# Patient Record
Sex: Female | Born: 1975
Health system: Southern US, Community
[De-identification: ages and names within clinical notes are randomized; demographics above are authoritative.]

## PROBLEM LIST (undated history)

## (undated) DIAGNOSIS — C439 Malignant melanoma of skin, unspecified: Secondary | ICD-10-CM

## (undated) DIAGNOSIS — R7611 Nonspecific reaction to tuberculin skin test without active tuberculosis: Secondary | ICD-10-CM

## (undated) DIAGNOSIS — N920 Excessive and frequent menstruation with regular cycle: Secondary | ICD-10-CM

## (undated) DIAGNOSIS — J852 Abscess of lung without pneumonia: Secondary | ICD-10-CM

## (undated) DIAGNOSIS — R634 Abnormal weight loss: Secondary | ICD-10-CM

## (undated) DIAGNOSIS — G43909 Migraine, unspecified, not intractable, without status migrainosus: Secondary | ICD-10-CM

## (undated) DIAGNOSIS — R55 Syncope and collapse: Secondary | ICD-10-CM

## (undated) DIAGNOSIS — E041 Nontoxic single thyroid nodule: Secondary | ICD-10-CM

## (undated) DIAGNOSIS — E236 Other disorders of pituitary gland: Secondary | ICD-10-CM

## (undated) DIAGNOSIS — J439 Emphysema, unspecified: Secondary | ICD-10-CM

## (undated) DIAGNOSIS — D649 Anemia, unspecified: Secondary | ICD-10-CM

## (undated) DIAGNOSIS — R011 Cardiac murmur, unspecified: Secondary | ICD-10-CM

## (undated) HISTORY — PX: UPPER GI ENDOSCOPY: SHX6162

## (undated) HISTORY — PX: BRONCHOSCOPY: SUR163

## (undated) HISTORY — DX: Syncope and collapse: R55

## (undated) HISTORY — DX: Abnormal weight loss: R63.4

## (undated) HISTORY — DX: Cardiac murmur, unspecified: R01.1

## (undated) HISTORY — DX: Migraine, unspecified, not intractable, without status migrainosus: G43.909

## (undated) HISTORY — DX: Nonspecific reaction to tuberculin skin test without active tuberculosis: R76.11

## (undated) HISTORY — PX: WISDOM TOOTH EXTRACTION: SHX21

## (undated) HISTORY — DX: Abscess of lung without pneumonia: J85.2

## (undated) HISTORY — DX: Malignant melanoma of skin, unspecified: C43.9

## (undated) HISTORY — DX: Excessive and frequent menstruation with regular cycle: N92.0

## (undated) HISTORY — PX: OTHER SURGICAL HISTORY: SHX169

## (undated) HISTORY — PX: MEDIASTINOSCOPY: SUR861

## (undated) HISTORY — PX: COLONOSCOPY: SHX174

## (undated) HISTORY — PX: BREAST SURGERY: SHX581

## (undated) HISTORY — DX: Emphysema, unspecified: J43.9

## (undated) HISTORY — PX: TUBAL LIGATION: SHX77

## (undated) HISTORY — DX: Nontoxic single thyroid nodule: E04.1

## (undated) HISTORY — DX: Other disorders of pituitary gland: E23.6

---

## 1999-08-07 ENCOUNTER — Other Ambulatory Visit: Admission: RE | Admit: 1999-08-07 | Discharge: 1999-08-07 | Payer: Self-pay | Admitting: Obstetrics and Gynecology

## 1999-09-26 ENCOUNTER — Other Ambulatory Visit: Admission: RE | Admit: 1999-09-26 | Discharge: 1999-09-26 | Payer: Self-pay | Admitting: Obstetrics and Gynecology

## 1999-09-26 ENCOUNTER — Encounter (INDEPENDENT_AMBULATORY_CARE_PROVIDER_SITE_OTHER): Payer: Self-pay

## 1999-12-20 ENCOUNTER — Other Ambulatory Visit: Admission: RE | Admit: 1999-12-20 | Discharge: 1999-12-20 | Payer: Self-pay | Admitting: Obstetrics and Gynecology

## 2000-04-03 ENCOUNTER — Other Ambulatory Visit: Admission: RE | Admit: 2000-04-03 | Discharge: 2000-04-03 | Payer: Self-pay | Admitting: Obstetrics and Gynecology

## 2001-03-26 ENCOUNTER — Other Ambulatory Visit: Admission: RE | Admit: 2001-03-26 | Discharge: 2001-03-26 | Payer: Self-pay | Admitting: Obstetrics and Gynecology

## 2007-07-17 DIAGNOSIS — E041 Nontoxic single thyroid nodule: Secondary | ICD-10-CM | POA: Insufficient documentation

## 2007-07-17 HISTORY — DX: Nontoxic single thyroid nodule: E04.1

## 2008-04-27 LAB — HM COLONOSCOPY

## 2008-05-17 ENCOUNTER — Ambulatory Visit: Payer: Self-pay | Admitting: Pulmonary Disease

## 2008-05-17 ENCOUNTER — Inpatient Hospital Stay (HOSPITAL_COMMUNITY): Admission: EM | Admit: 2008-05-17 | Discharge: 2008-05-22 | Payer: Self-pay | Admitting: Emergency Medicine

## 2008-05-19 ENCOUNTER — Ambulatory Visit: Payer: Self-pay | Admitting: Infectious Disease

## 2008-05-21 ENCOUNTER — Ambulatory Visit: Payer: Self-pay | Admitting: Hematology & Oncology

## 2008-05-21 ENCOUNTER — Encounter: Payer: Self-pay | Admitting: Infectious Disease

## 2008-05-21 ENCOUNTER — Encounter: Payer: Self-pay | Admitting: Pulmonary Disease

## 2008-05-24 ENCOUNTER — Encounter: Payer: Self-pay | Admitting: Pulmonary Disease

## 2008-06-03 ENCOUNTER — Ambulatory Visit: Payer: Self-pay | Admitting: Thoracic Surgery

## 2008-06-22 ENCOUNTER — Ambulatory Visit: Payer: Self-pay | Admitting: Thoracic Surgery

## 2008-06-22 ENCOUNTER — Encounter: Admission: RE | Admit: 2008-06-22 | Discharge: 2008-06-22 | Payer: Self-pay | Admitting: Thoracic Surgery

## 2008-06-23 ENCOUNTER — Ambulatory Visit: Payer: Self-pay | Admitting: Pulmonary Disease

## 2008-06-23 DIAGNOSIS — Z716 Tobacco abuse counseling: Secondary | ICD-10-CM

## 2008-06-23 HISTORY — DX: Tobacco abuse counseling: Z71.6

## 2008-06-24 ENCOUNTER — Ambulatory Visit: Payer: Self-pay | Admitting: Infectious Disease

## 2008-06-24 DIAGNOSIS — I781 Nevus, non-neoplastic: Secondary | ICD-10-CM

## 2008-06-24 DIAGNOSIS — R634 Abnormal weight loss: Secondary | ICD-10-CM

## 2008-06-24 DIAGNOSIS — R599 Enlarged lymph nodes, unspecified: Secondary | ICD-10-CM | POA: Insufficient documentation

## 2008-06-24 DIAGNOSIS — R197 Diarrhea, unspecified: Secondary | ICD-10-CM | POA: Insufficient documentation

## 2008-06-24 DIAGNOSIS — Z227 Latent tuberculosis: Secondary | ICD-10-CM

## 2008-06-24 DIAGNOSIS — R911 Solitary pulmonary nodule: Secondary | ICD-10-CM

## 2008-06-24 HISTORY — DX: Latent tuberculosis: Z22.7

## 2008-06-24 HISTORY — DX: Nevus, non-neoplastic: I78.1

## 2008-06-24 HISTORY — DX: Solitary pulmonary nodule: R91.1

## 2008-06-24 HISTORY — DX: Abnormal weight loss: R63.4

## 2008-06-28 ENCOUNTER — Encounter: Payer: Self-pay | Admitting: Thoracic Surgery

## 2008-06-28 ENCOUNTER — Inpatient Hospital Stay (HOSPITAL_COMMUNITY): Admission: RE | Admit: 2008-06-28 | Discharge: 2008-06-28 | Payer: Self-pay | Admitting: Thoracic Surgery

## 2008-06-28 ENCOUNTER — Ambulatory Visit: Payer: Self-pay | Admitting: Thoracic Surgery

## 2008-06-30 ENCOUNTER — Ambulatory Visit: Payer: Self-pay | Admitting: Thoracic Surgery

## 2008-07-16 HISTORY — PX: UPPER GI ENDOSCOPY: SHX6162

## 2008-07-16 HISTORY — PX: LUNG REMOVAL, PARTIAL: SHX233

## 2008-07-16 HISTORY — PX: COLONOSCOPY: SHX174

## 2008-07-21 ENCOUNTER — Encounter: Admission: RE | Admit: 2008-07-21 | Discharge: 2008-07-21 | Payer: Self-pay | Admitting: Thoracic Surgery

## 2008-07-21 ENCOUNTER — Ambulatory Visit: Payer: Self-pay | Admitting: Thoracic Surgery

## 2008-08-31 ENCOUNTER — Encounter: Payer: Self-pay | Admitting: Pulmonary Disease

## 2008-09-22 ENCOUNTER — Ambulatory Visit (HOSPITAL_COMMUNITY): Admission: RE | Admit: 2008-09-22 | Discharge: 2008-09-22 | Payer: Self-pay | Admitting: Infectious Disease

## 2008-09-22 ENCOUNTER — Ambulatory Visit: Payer: Self-pay | Admitting: Infectious Disease

## 2008-09-22 DIAGNOSIS — R0789 Other chest pain: Secondary | ICD-10-CM

## 2008-09-22 DIAGNOSIS — C4359 Malignant melanoma of other part of trunk: Secondary | ICD-10-CM

## 2008-09-22 DIAGNOSIS — R091 Pleurisy: Secondary | ICD-10-CM | POA: Insufficient documentation

## 2008-09-22 HISTORY — DX: Malignant melanoma of other part of trunk: C43.59

## 2008-09-22 HISTORY — DX: Other chest pain: R07.89

## 2008-09-22 LAB — CONVERTED CEMR LAB
BUN: 8 mg/dL (ref 6–23)
Basophils Absolute: 0 10*3/uL (ref 0.0–0.1)
Basophils Relative: 0 % (ref 0–1)
Eosinophils Relative: 1 % (ref 0–5)
Glucose, Bld: 94 mg/dL (ref 70–99)
HCT: 38.5 % (ref 36.0–46.0)
Lymphocytes Relative: 22 % (ref 12–46)
MCHC: 34.8 g/dL (ref 30.0–36.0)
MCV: 90.7 fL (ref 78.0–100.0)
Monocytes Absolute: 0.7 10*3/uL (ref 0.1–1.0)
Platelets: 191 10*3/uL (ref 150–400)
Potassium: 4.1 meq/L (ref 3.5–5.3)
RDW: 13.9 % (ref 11.5–15.5)

## 2008-10-05 ENCOUNTER — Ambulatory Visit: Payer: Self-pay | Admitting: Pulmonary Disease

## 2008-11-09 DIAGNOSIS — J189 Pneumonia, unspecified organism: Secondary | ICD-10-CM | POA: Insufficient documentation

## 2008-11-09 HISTORY — DX: Pneumonia, unspecified organism: J18.9

## 2008-12-08 ENCOUNTER — Telehealth: Payer: Self-pay | Admitting: Infectious Disease

## 2008-12-09 ENCOUNTER — Telehealth: Payer: Self-pay | Admitting: Infectious Disease

## 2009-05-01 IMAGING — CT CT CHEST W/O CM
2 of 3 series · 15 of 36 positions shown, 18 images · non-contrast
Comparison: Chest x-ray of 05/21/2008 and CT chest of 11 [DATE]

CLINICAL DATA: Left upper lobe lung mass, short of breath with
exertion, night sweats

CT CHEST WITHOUT CONTRAST
TECHNIQUE: Multidetector CT imaging of the chest was performed
following the standard protocol without IV contrast.

[Series 3: routine chest · axial · 0.64mm/px · z∈[-305,-35]mm · 12 of 64 slices shown, 15 images]
[im 5/64  mediastinal]
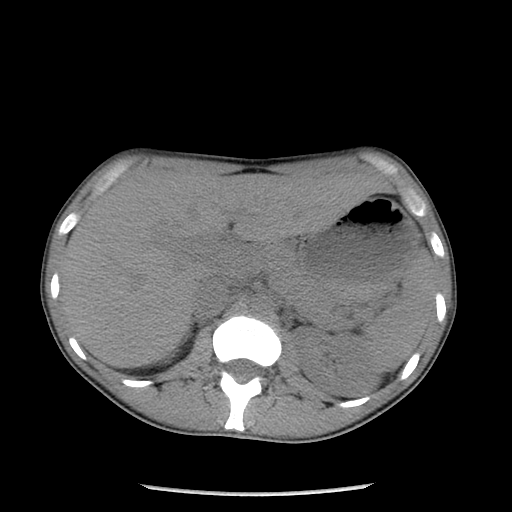
[im 5/64  lung]
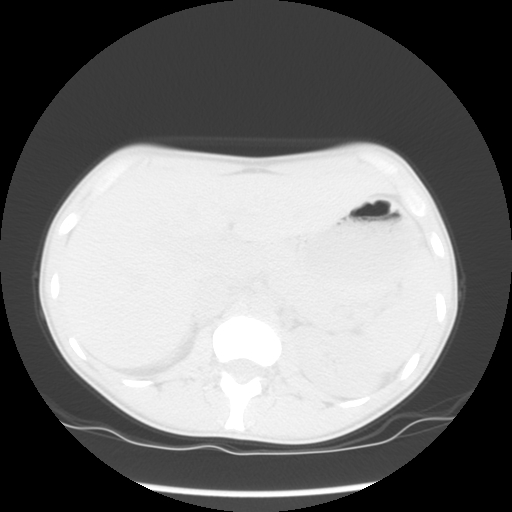
[im 10/64  lung]
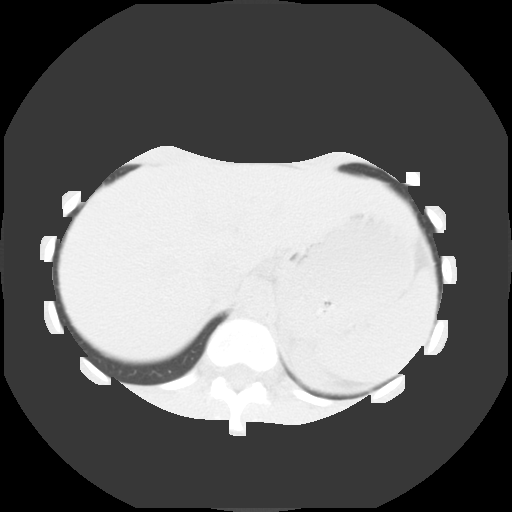
[im 15/64  lung]
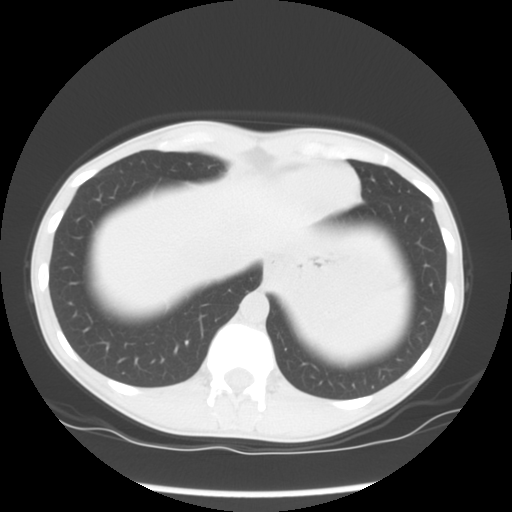
[im 19/64  lung]
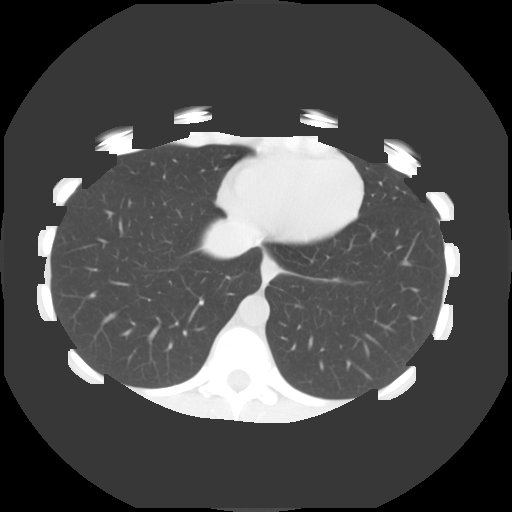
[im 24/64  mediastinal]
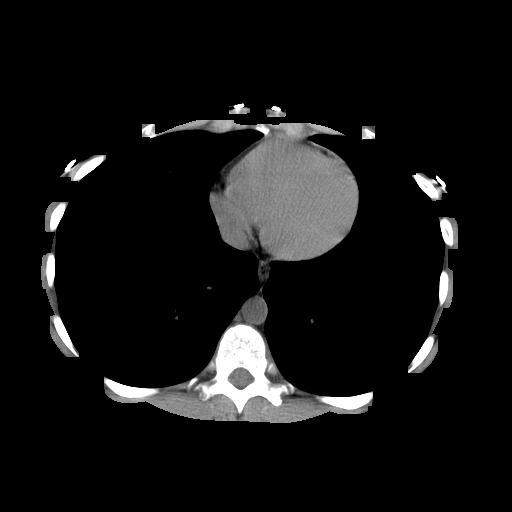
[im 24/64  lung]
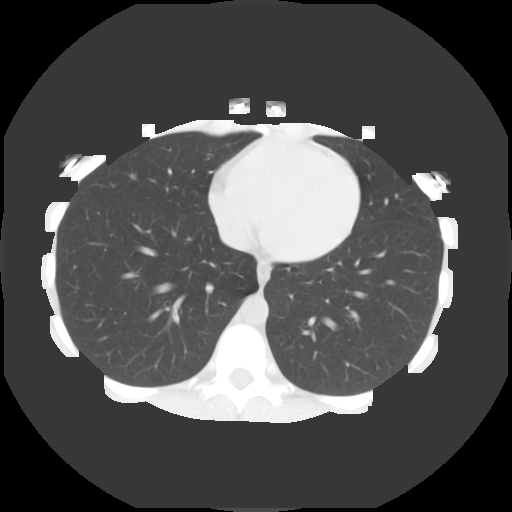
[im 29/64  lung]
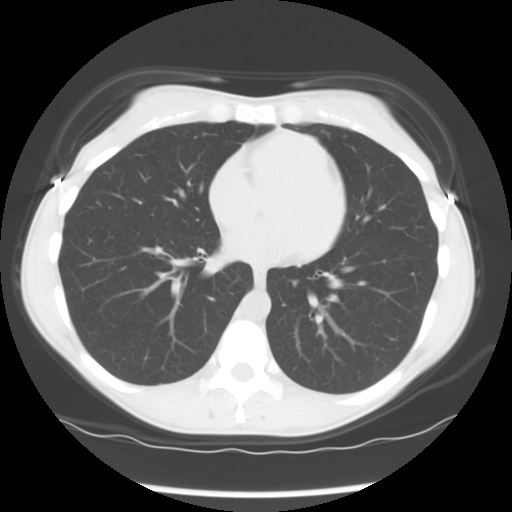
[im 36/64  lung]
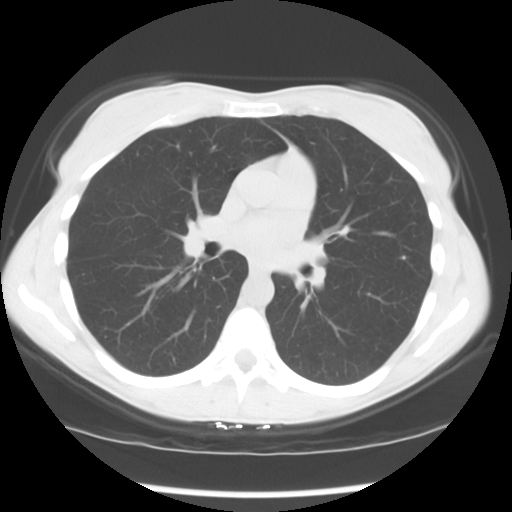
[im 40/64  lung]
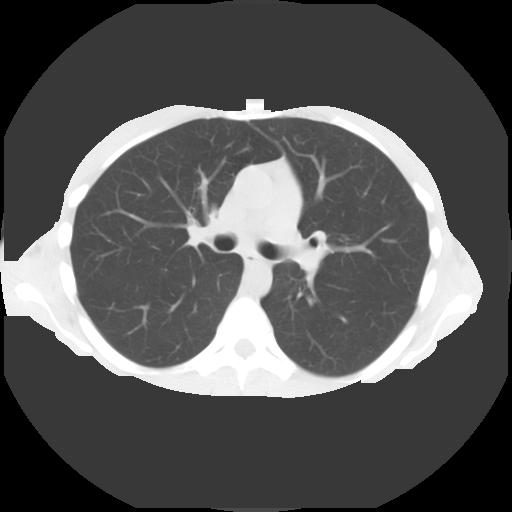
[im 45/64  mediastinal]
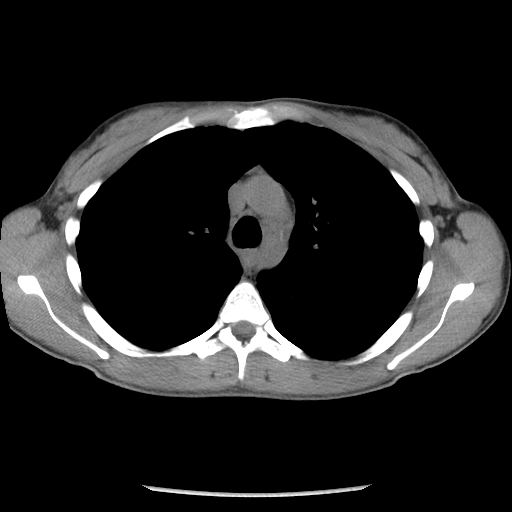
[im 45/64  lung]
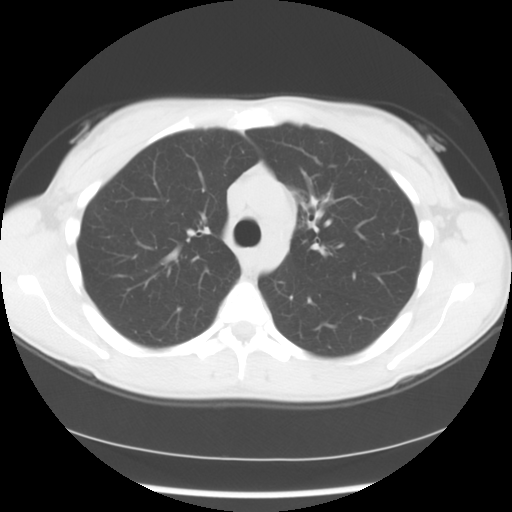
[im 50/64  lung]
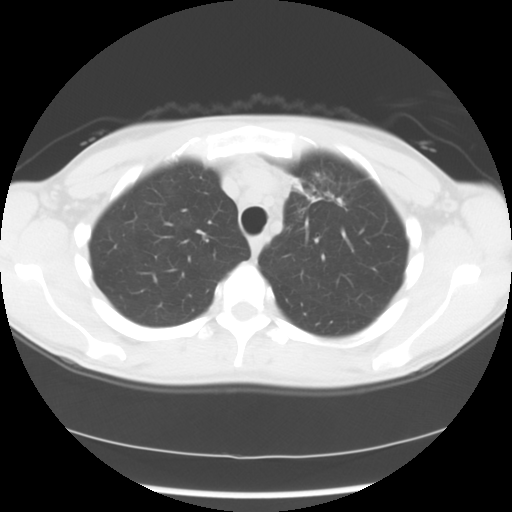
[im 54/64  lung]
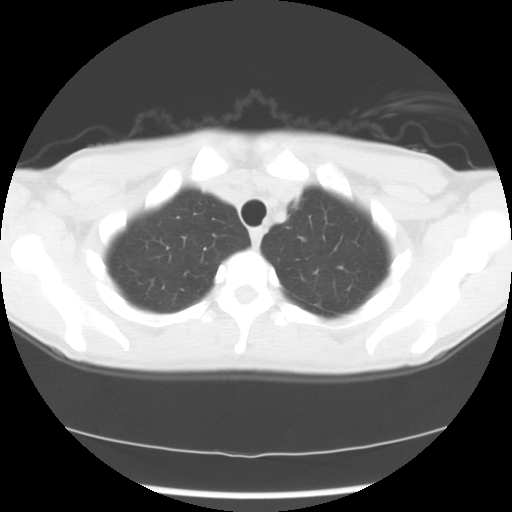
[im 59/64  lung]
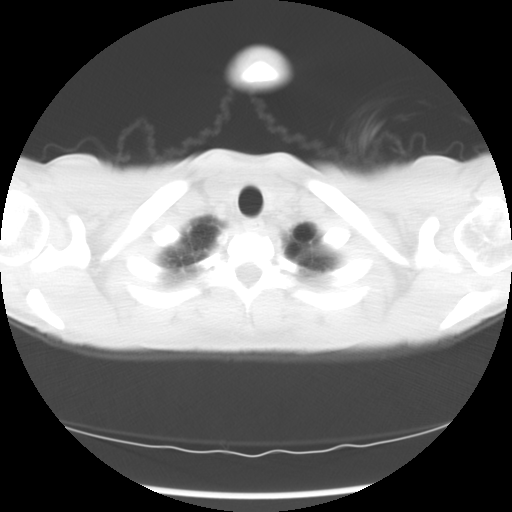

[Series 602: sagittal body · sagittal · 0.64mm/px · 3 of 133 slices shown]
[im 27/133  lung]
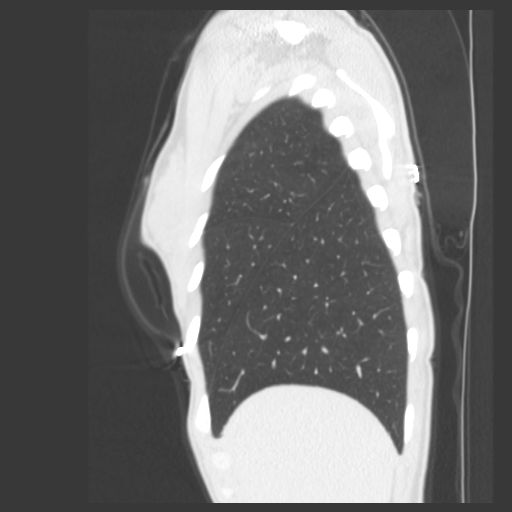
[im 53/133  lung]
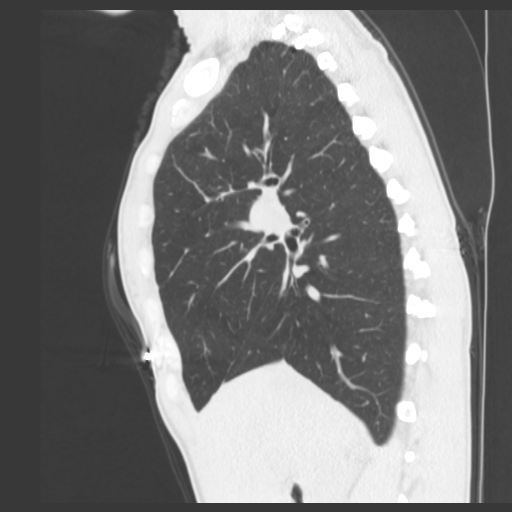
[im 80/133  lung]
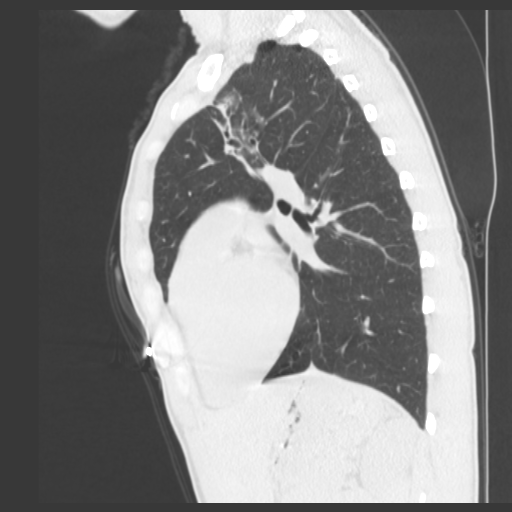

[15 of 36 positions shown; findings below may reference images not displayed]

FINDINGS: There has been definite interval improvement in the
nodular opacity within the anteromedial left upper lobe when
compared to the prior CT.  This suggests an inflammatory origin for
this process.  Slightly prominent bronchi are noted in the left
upper lobe which may indicate some scarring and mild traction
bronchiectasis.  Again tuberculosis would be a primary
consideration in view of the location and clinical history.  The
remainder of the lungs are clear.  No new infiltrate, nodule, or
effusion is noted.  Only a few small mediastinal nodes are present
in the anterior superior mediastinum to the left of midline.  These
appear to have decreased somewhat in the interval in size.  No new
mediastinal or hilar adenopathy is seen.
IMPRESSION: Significant improvement in the nodular opacity within the
anteromedial left upper lobe most consistent with improving
inflammatory process, possibly secondary to tuberculosis.  Decrease
in mediastinal nodes.

## 2009-05-30 IMAGING — CR DG CHEST 2V
2 series · 2 of 2 positions shown · non-contrast
Comparison: Chest x-ray of 06/28/2008 chest x-ray of 06/28/2008 and
CT of the chest of 06/22/2008

CLINICAL DATA: Follow up of left upper lobe lung lesion

CHEST - 2 VIEW

[w chest pa]
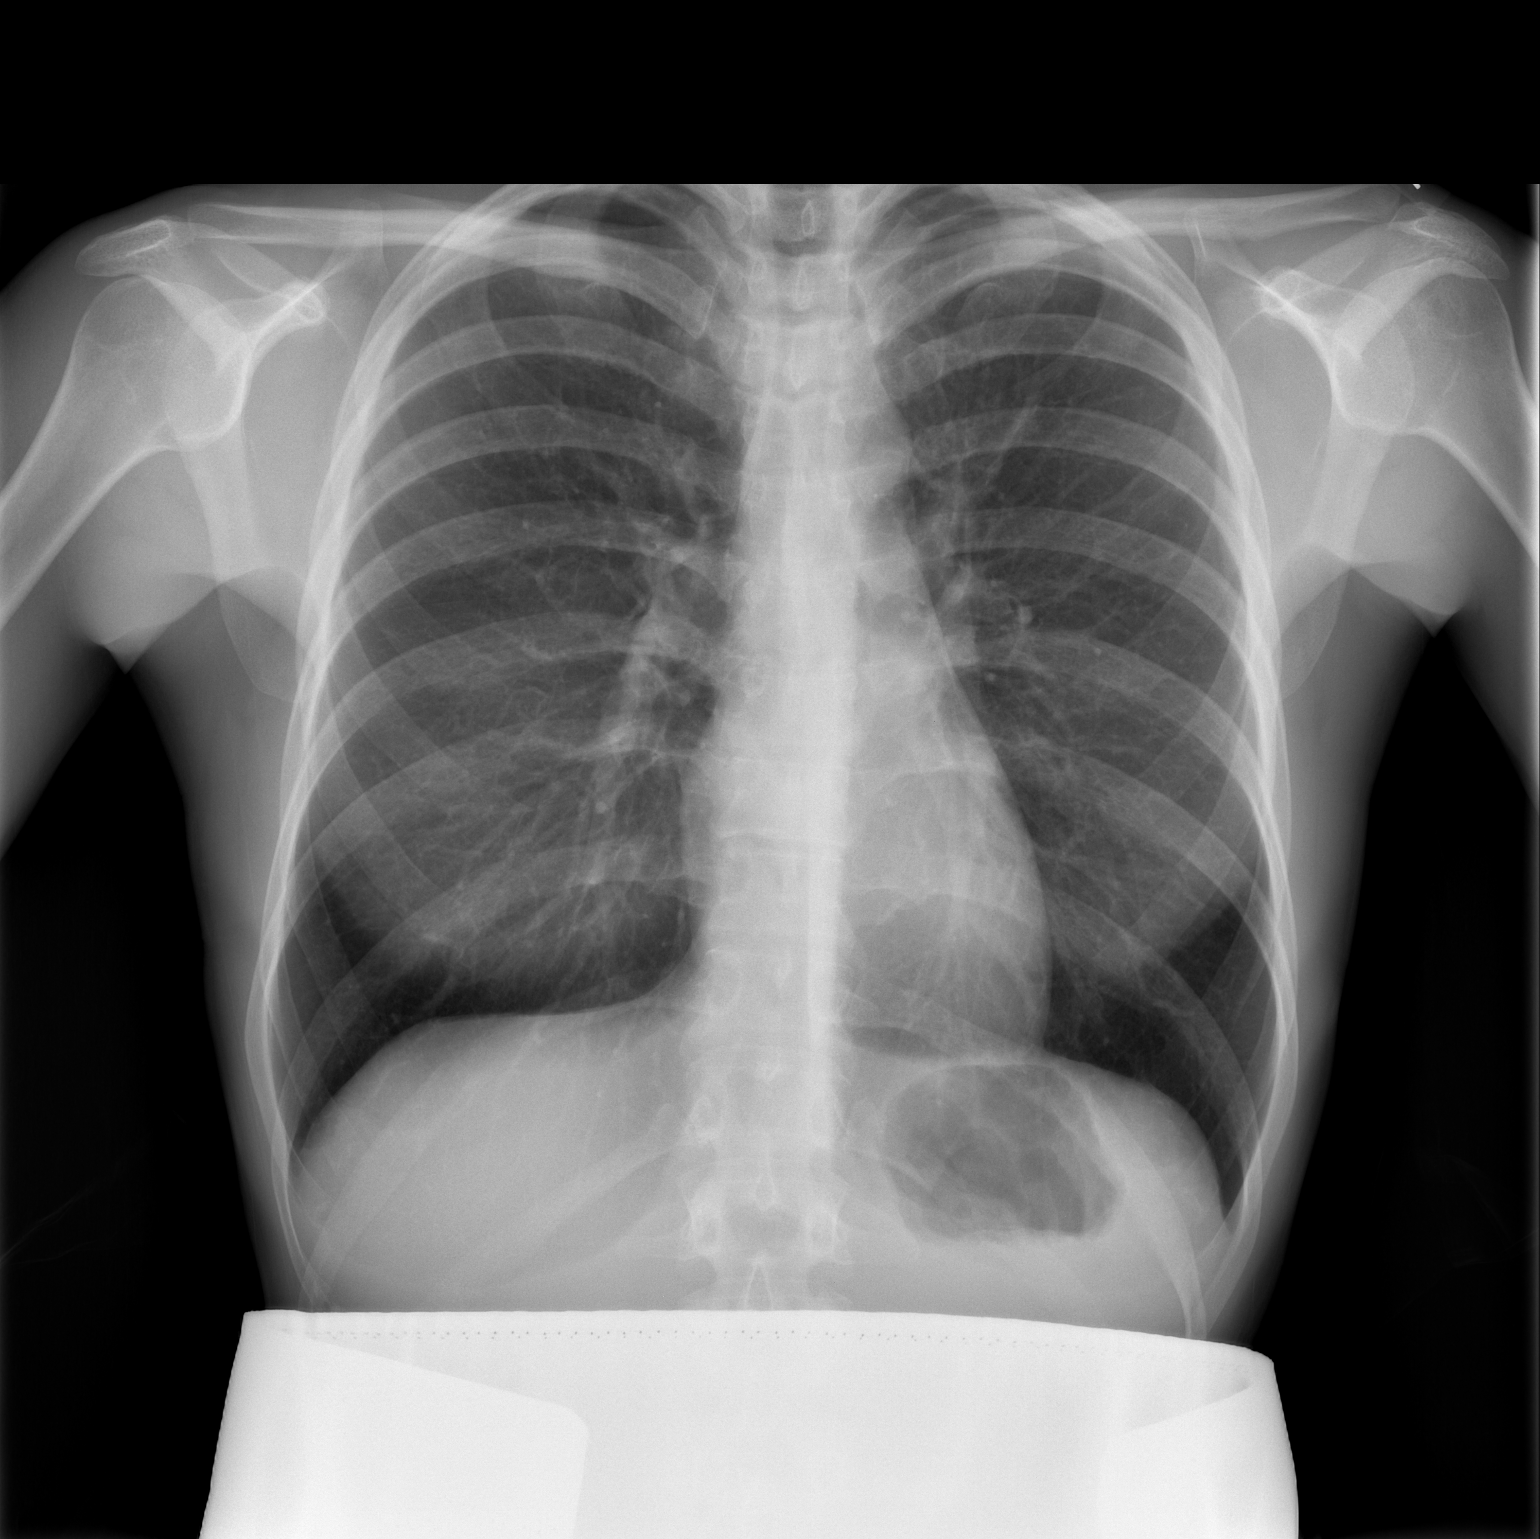

[w chest lat]
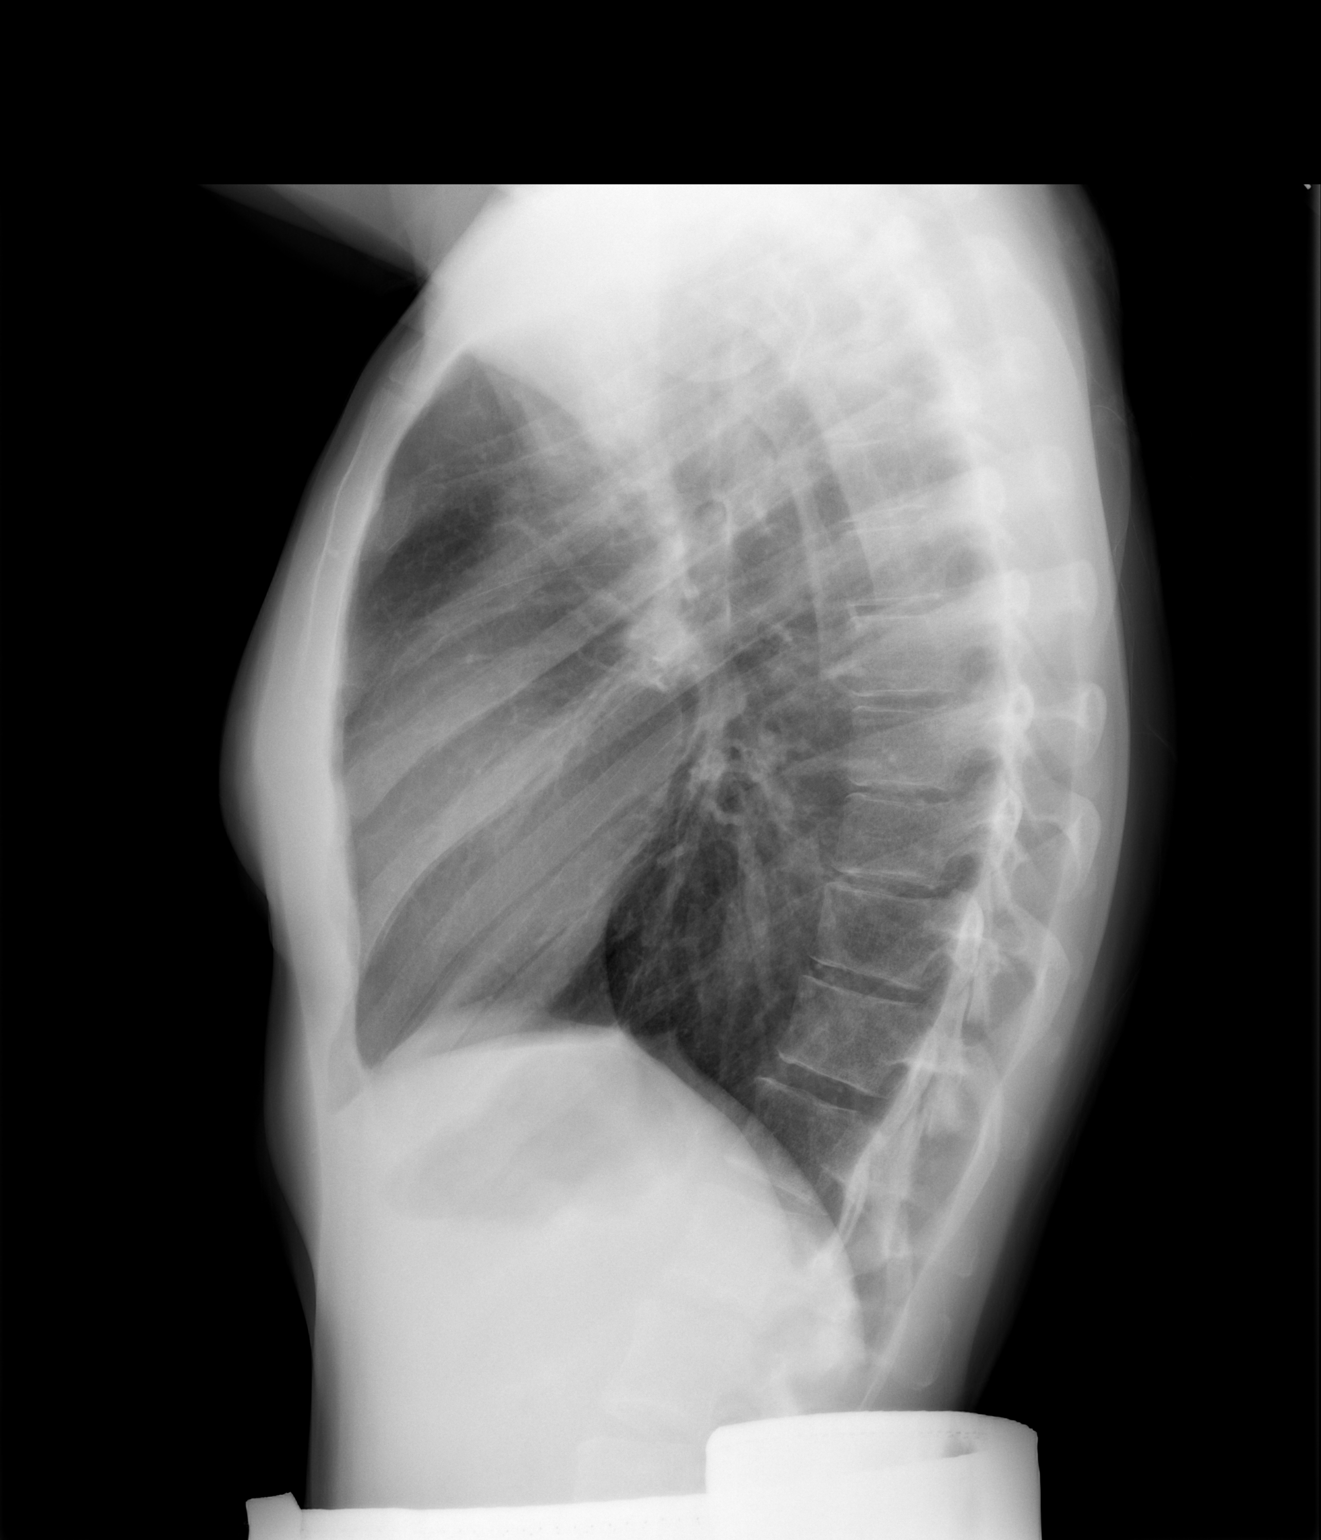

[2 of 2 positions shown; findings below may reference images not displayed]

FINDINGS: The lungs are clear but hyperaerated.  No active
infiltrate or effusion is seen.  No persistent opacity within the
left upper lobe is seen.  The heart is within normal limits in
size.  No bony abnormality is noted.
IMPRESSION: Hyperaeration.  No active lung disease.

## 2010-02-05 ENCOUNTER — Emergency Department (HOSPITAL_COMMUNITY): Admission: EM | Admit: 2010-02-05 | Discharge: 2010-02-05 | Payer: Self-pay | Admitting: *Deleted

## 2010-05-16 ENCOUNTER — Emergency Department (HOSPITAL_COMMUNITY): Admission: EM | Admit: 2010-05-16 | Discharge: 2010-05-16 | Payer: Self-pay | Admitting: Emergency Medicine

## 2010-05-18 ENCOUNTER — Ambulatory Visit (HOSPITAL_COMMUNITY): Admission: RE | Admit: 2010-05-18 | Discharge: 2010-05-18 | Payer: Self-pay | Admitting: Family Medicine

## 2010-05-18 ENCOUNTER — Encounter: Payer: Self-pay | Admitting: Family Medicine

## 2010-05-18 ENCOUNTER — Ambulatory Visit: Payer: Self-pay | Admitting: Family Medicine

## 2010-05-18 DIAGNOSIS — R002 Palpitations: Secondary | ICD-10-CM

## 2010-05-18 DIAGNOSIS — R5381 Other malaise: Secondary | ICD-10-CM | POA: Insufficient documentation

## 2010-05-18 DIAGNOSIS — R5383 Other fatigue: Secondary | ICD-10-CM

## 2010-05-18 HISTORY — DX: Palpitations: R00.2

## 2010-05-18 HISTORY — DX: Other malaise: R53.81

## 2010-05-22 ENCOUNTER — Telehealth: Payer: Self-pay | Admitting: *Deleted

## 2010-05-25 ENCOUNTER — Ambulatory Visit (HOSPITAL_COMMUNITY): Admission: RE | Admit: 2010-05-25 | Discharge: 2010-05-25 | Payer: Self-pay | Admitting: Family Medicine

## 2010-05-25 ENCOUNTER — Telehealth: Payer: Self-pay | Admitting: *Deleted

## 2010-08-13 LAB — CONVERTED CEMR LAB
ALT: 9 units/L (ref 0–35)
AST: 16 units/L (ref 0–37)
Albumin: 4.8 g/dL (ref 3.5–5.2)
Alkaline Phosphatase: 43 units/L (ref 39–117)
BUN: 7 mg/dL (ref 6–23)
CO2: 25 meq/L (ref 19–32)
Calcium: 9.2 mg/dL (ref 8.4–10.5)
Chloride: 104 meq/L (ref 96–112)
Creatinine, Ser: 0.78 mg/dL (ref 0.40–1.20)
Glucose, Bld: 83 mg/dL (ref 70–99)
HCT: 41.5 % (ref 36.0–46.0)
Hemoglobin: 13.9 g/dL (ref 12.0–15.0)
LDH: 129 units/L (ref 94–250)
MCHC: 33.5 g/dL (ref 30.0–36.0)
MCV: 90.4 fL (ref 78.0–100.0)
Platelets: 187 10*3/uL (ref 150–400)
Potassium: 4 meq/L (ref 3.5–5.3)
RBC: 4.59 M/uL (ref 3.87–5.11)
RDW: 13.1 % (ref 11.5–15.5)
Sodium: 139 meq/L (ref 135–145)
TSH: 0.67 microintl units/mL (ref 0.350–4.500)
Total Bilirubin: 0.4 mg/dL (ref 0.3–1.2)
Total Protein: 7.4 g/dL (ref 6.0–8.3)
WBC: 8.1 10*3/uL (ref 4.0–10.5)

## 2010-08-15 NOTE — Progress Notes (Signed)
  Phone Note Call from Patient   Caller: Patient Call For: [phone] Summary of Call: [name] is requesting fax of lab info to number in previous note Initial call taken by: Abundio Miu,  May 25, 2010 10:51 AM  Follow-up for Phone Call        faxed lab and results of CT to pt. Follow-up by: Tessie Fass CMA,  May 26, 2010 11:57 AM

## 2010-08-15 NOTE — Miscellaneous (Signed)
Summary: ROI  ROI   Imported By: De Nurse 05/25/2010 12:12:30  _____________________________________________________________________  External Attachment:    Type:   Image     Comment:   External Document

## 2010-08-15 NOTE — Assessment & Plan Note (Signed)
Summary: NEW PT/PHYSICAL/DGTR [name] EST/TO BRING $20 FOR VISIT/[facility]   Vital Signs:  Patient profile:   35 year old female Height:      66.5 inches Weight:      110 pounds BMI:     17.55 Pulse rate:   81 / minute BP sitting:   135 / 86  (left arm) Cuff size:   regular  Vitals Entered By: Tessie Fass CMA (May 18, 2010 2:24 PM) CC: NEW PT Is Patient Diabetic? No Pain Assessment Patient in pain? no        Primary Care Provider:  Helane Rima DO  CC:  NEW PT.  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 She had GI workup with colonoscopy and biopsies, EGD done by Dr. Alphonzo Lemmings, serologies, all of which were unrevealing. Seen by ID when PPD converted to positive (she had history of [place] friend who had LTB but no other known contacts) and a LUL cavitary lesion with lymhadenopathy.    Her medical history prior to this year largely significant for smoking and migraine headaches.   While at [facility] she was ruled out for TB with serial sputa and bronchoscopy by Dr. Vassie Loll that revealed a white colored endobronchial lesion. Her cultures from BAL, sputa, biopsy failed to grow fungi or TB, path was nondiagnsostic. Her WBC came down while on antibiotics in the hospital day one which were then discontinued. Her ESR was normal at that time. Urine histo ag, serum crypto ags, were negative, ANA and ANCA were negative. Her CT scan showed improvement in her LUL as well as her mediastinal LN. She had mediastinoscopy with LN biopsy and BAL in December which revealed only reactive anthracotic tissue without organisms. AFB, Fungal, nocardia cultures ALL failed to yield an organism. She FINALLY had abnormal skin lesion biopsied by her PCP and this has revlealed stage III melanoma.   Dr Claudie Fisherman , Onc at [facility] - PET neg,  IR needle Bx of lung lesion yielded pus, proceeded with  VATS given persistent fever with chills.  (Hx vague here - patient states that she felt better for several months, had repeat CT that looked good. Not scheduled to f/u with Chin).  She has had reemergence of night sweats, as well as dyspnea on exertion and pleuritic type chest pain. She endorses a 15 pound weight loss again. She stopped oral contraceptives 2 days ago, continues to smoke tobacco.    Habits & Providers  Alcohol-Tobacco-Diet     Tobacco Status: current     Tobacco Counseling: to quit use of tobacco products     Cigarette Packs/Day: 1.0  Current Medications (verified): 1)  None  Allergies (verified): No Known Drug Allergies  Past History:  Past Medical History: Migraine HA Smoker Positive PPD Weight loss Left upper lobe cavity LUL endobronchial lesion with lymphadenopathy    - anthracotic pigment on biopsy seen but no malignancy or organism isolated Stage III Melanoma Hx Thyroid Nodules Hemorroids  Past Surgical History: Bronchoscopy Mediastinoscopy Tubal Ligation  Family History: Positive for CAD, not for lymphoma.  Social History: Smoker. Has two children. Occasional alcohol use. Denies recreational drugs.   Review of Systems General:  Complains of fatigue, loss of appetite, sweats, and weight loss; denies chills and fever. CV:  Complains of chest pain or discomfort and palpitations; denies difficulty breathing while lying down, fainting, shortness of  breath with exertion, and swelling of feet. Resp:  Denies chest pain with inspiration, cough, coughing up blood, shortness of breath, sputum productive, and wheezing. GI:  Denies abdominal pain, constipation, diarrhea, nausea, and vomiting. MS:  Complains of joint pain and muscle aches; denies joint redness and joint swelling. Derm:  Denies lesion(s) and rash. Neuro:  Complains of numbness and tingling; denies falling down, headaches, seizures, sensation of room spinning, and visual  disturbances. Psych:  Denies anxiety and depression. Endo:  Denies cold intolerance and heat intolerance. Heme:  Denies abnormal bruising, bleeding, and enlarge lymph nodes.  Physical Exam  General:  Thin, in no acute distress; alert, appropriate and cooperative throughout examination. Vitals reviewed. Neck:  No deformities, masses, or tenderness noted. Lungs:  Normal respiratory effort, chest expands symmetrically. Lungs are clear to auscultation, no crackles or wheezes. Heart:  Normal rate and regular rhythm. S1 and S2 normal without gallop, murmur, click, rub or other extra sounds. Pulses:  R and L, dorsalis pedis and posterior tibial pulses are full and equal bilaterally. Extremities:  No edema. Skin:  Intact without suspicious lesions or rashes. Cervical Nodes:  No lymphadenopathy noted. Axillary Nodes:  No palpable lymphadenopathy. Inguinal Nodes:  No significant adenopathy. Psych:  Oriented X3, memory intact for recent and remote, and good eye contact. Slightly pressure speech.   Impression & Recommendations:  Problem # 1:  WEIGHT LOSS, ABNORMAL (ICD-783.21) Assessment New CBC, CMP, TSH, LDH, SED. CT chest.  Orders: LDH-[facility] (40347) Sed Rate (ESR)-[facility] (42595)  Problem # 2:  CHEST PAIN (ICD-786.50) Assessment: New EKG NSR, no ST/T changes. Will compare to previous EKG when records obtained. DDX: Cardiac - low likelihood. Patient does have family Hx of CAD and is a smoker, but Hx not c/w. No signs of percarditis on exam. VSS. DDx: Pleuritic - more likely. Hx of cavitary lesion, now with same symptoms as before - will persue work-up. CT pending. Patient not tachycardic. Normal O2 sat. DDx: GI - not c/w history. Patient has previous normal endoscopy and colonoscopy. DDx: MSK - not reproducable.   Will review patient's previous records and update chart.  Problem # 3:  PULMONARY NODULE (ICD-518.89) Assessment: Unchanged Hx of, with similar c/o. Will go ahead and obtain another CT  scan at this time. Note: Patient may not be able to get until she signs up for [facility]. Orders: CT without Contrast (CT w/o contrast)  Problem # 4:  TOBACCO ABUSE (ICD-305.1) Assessment: Unchanged Advised cessation.  Other Orders: Comp Met-[facility] [phone]) CBC-[facility] (95188) TSH-[facility] [phone]) CXR- 2view (CXR)  Patient Instructions: 1)  It was nice to see you today. 2)  We are getting labs, an EKG, and CT today. 3)  I will review your records.   Orders Added: 1)  Comp Met-[facility] [[id number]] 2)  CBC-[facility] [85027] 3)  TSH-[facility] [[id number]] 4)  CXR- 2view [CXR] 5)  LDH-[facility] [83615] 6)  Sed Rate (ESR)-[facility] [85651] 7)  CT without Contrast [CT w/o contrast] 8)  [facility] [99203]    Laboratory Results   Blood Tests   Date/Time Received: May 18, 2010 3:23 PM Date/Time Reported: May 18, 2010 5:15 PM   SED rate: 5  mm/hr  Comments: ...............test performed by......Marland KitchenBonnie A. [place], MLS (ASCP)cm

## 2010-08-15 NOTE — Progress Notes (Signed)
Summary: phn msg  Phone Note Call from Patient Call back at Home Phone [phone]   Caller: Patient Summary of Call: pt is asking about the CT and also the fax # [phone] that is supposed to go on the release form Initial call taken by: De Nurse,  May 22, 2010 12:06 PM  Follow-up for Phone Call        called pt with appt time for CT. Follow-up by: Tessie Fass CMA,  May 22, 2010 4:39 PM

## 2010-08-21 ENCOUNTER — Encounter (INDEPENDENT_AMBULATORY_CARE_PROVIDER_SITE_OTHER): Payer: Self-pay | Admitting: Family Medicine

## 2010-08-21 ENCOUNTER — Encounter: Payer: Self-pay | Admitting: Family Medicine

## 2010-08-21 ENCOUNTER — Other Ambulatory Visit: Payer: Self-pay | Admitting: Family Medicine

## 2010-08-21 DIAGNOSIS — R7989 Other specified abnormal findings of blood chemistry: Secondary | ICD-10-CM

## 2010-08-21 DIAGNOSIS — E349 Endocrine disorder, unspecified: Secondary | ICD-10-CM

## 2010-08-21 DIAGNOSIS — R5383 Other fatigue: Secondary | ICD-10-CM

## 2010-08-21 DIAGNOSIS — N926 Irregular menstruation, unspecified: Secondary | ICD-10-CM

## 2010-08-21 DIAGNOSIS — R5381 Other malaise: Secondary | ICD-10-CM

## 2010-08-21 DIAGNOSIS — R079 Chest pain, unspecified: Secondary | ICD-10-CM

## 2010-08-21 DIAGNOSIS — R002 Palpitations: Secondary | ICD-10-CM

## 2010-08-22 ENCOUNTER — Ambulatory Visit (INDEPENDENT_AMBULATORY_CARE_PROVIDER_SITE_OTHER): Payer: Self-pay

## 2010-08-22 ENCOUNTER — Encounter: Payer: Self-pay | Admitting: Family Medicine

## 2010-08-22 ENCOUNTER — Encounter: Payer: Self-pay | Admitting: *Deleted

## 2010-08-22 LAB — CBC WITH DIFFERENTIAL/PLATELET
Basophils Absolute: 0 10*3/uL (ref 0.0–0.1)
Basophils Relative: 0 % (ref 0–1)
Eosinophils Absolute: 0 10*3/uL (ref 0.0–0.7)
Eosinophils Relative: 0 % (ref 0–5)
HCT: 42.4 % (ref 36.0–46.0)
Hemoglobin: 14.3 g/dL (ref 12.0–15.0)
Lymphocytes Relative: 21 % (ref 12–46)
Lymphs Abs: 2 10*3/uL (ref 0.7–4.0)
MCH: 30.6 pg (ref 26.0–34.0)
MCHC: 33.7 g/dL (ref 30.0–36.0)
MCV: 90.6 fL (ref 78.0–100.0)
Monocytes Absolute: 0.5 10*3/uL (ref 0.1–1.0)
Monocytes Relative: 5 % (ref 3–12)
Neutro Abs: 7 10*3/uL (ref 1.7–7.7)
Neutrophils Relative %: 74 % (ref 43–77)
Platelets: 218 10*3/uL (ref 150–400)
RBC: 4.68 MIL/uL (ref 3.87–5.11)
RDW: 13.9 % (ref 11.5–15.5)
WBC: 9.5 10*3/uL (ref 4.0–10.5)

## 2010-08-22 LAB — COMPREHENSIVE METABOLIC PANEL
ALT: 9 U/L (ref 0–35)
AST: 15 U/L (ref 0–37)
Albumin: 4.3 g/dL (ref 3.5–5.2)
Alkaline Phosphatase: 47 U/L (ref 39–117)
BUN: 10 mg/dL (ref 6–23)
CO2: 27 mEq/L (ref 19–32)
Calcium: 9.1 mg/dL (ref 8.4–10.5)
Chloride: 103 mEq/L (ref 96–112)
Creat: 0.81 mg/dL (ref 0.40–1.20)
Glucose, Bld: 79 mg/dL (ref 70–99)
Potassium: 4.2 mEq/L (ref 3.5–5.3)
Sodium: 138 mEq/L (ref 135–145)
Total Bilirubin: 0.3 mg/dL (ref 0.3–1.2)
Total Protein: 7 g/dL (ref 6.0–8.3)

## 2010-08-22 LAB — LUTEINIZING HORMONE: LH: <0.1 m[IU]/mL

## 2010-08-22 LAB — FOLLICLE STIMULATING HORMONE: FSH: 0.3 m[IU]/mL

## 2010-08-24 ENCOUNTER — Telehealth: Payer: Self-pay | Admitting: Family Medicine

## 2010-08-24 DIAGNOSIS — N926 Irregular menstruation, unspecified: Secondary | ICD-10-CM

## 2010-08-24 HISTORY — DX: Irregular menstruation, unspecified: N92.6

## 2010-08-24 NOTE — Progress Notes (Signed)
  Subjective:    Patient ID: [name], female    DOB: 08-14-1975, 35 y.o.   MRN: [id number]  HPI  Reviewed PCP note, sent info to Red team to call pt to back in for repeat labs  Review of Systems     Objective:   Physical Exam        Assessment & Plan:

## 2010-08-24 NOTE — Progress Notes (Signed)
T.C. I did call the pt already. See phone note from 08-24-10. Pt has lab appt Monday and she will sched. Ov with Dr.Wallace in one week.

## 2010-08-24 NOTE — Progress Notes (Signed)
Addended by: Milinda Antis on: 08/24/2010 03:03 PM   Modules accepted: Orders

## 2010-08-24 NOTE — Telephone Encounter (Signed)
Called pt and informed that we will re-check FSH, LH, TSH, FREE T4, FREE T3 AND BHCG QUANT. Pt has lab appt on Monday. LMP 3 weeks ago. Pt will make appt with Dr.Wallace in one week.

## 2010-08-28 ENCOUNTER — Other Ambulatory Visit: Payer: Self-pay

## 2010-08-28 DIAGNOSIS — R634 Abnormal weight loss: Secondary | ICD-10-CM

## 2010-08-28 DIAGNOSIS — N926 Irregular menstruation, unspecified: Secondary | ICD-10-CM

## 2010-08-28 LAB — HCG, QUANTITATIVE, PREGNANCY: hCG, Beta Chain, Quant, S: <2 m[IU]/mL

## 2010-08-28 LAB — T4: T4, Total: 12.6 ug/dL — ABNORMAL HIGH (ref 5.0–12.5)

## 2010-08-28 LAB — FOLLICLE STIMULATING HORMONE: FSH: 0.5 m[IU]/mL

## 2010-08-28 LAB — LUTEINIZING HORMONE: LH: 0.2 m[IU]/mL

## 2010-08-30 ENCOUNTER — Telehealth: Payer: Self-pay | Admitting: Family Medicine

## 2010-08-30 NOTE — Telephone Encounter (Signed)
To pcp

## 2010-08-31 NOTE — Telephone Encounter (Signed)
Pt called today wanting a copy of labs

## 2010-08-31 NOTE — Miscellaneous (Signed)
Summary: Orders Update  Clinical Lists Changes   Tried to reach patient by phone with no answer x 2 and I will be on vacation for one week starting tomorrow. Patient with normal CBC, CMP. Her FSH and LH were extremely low - in the pregnancy range. Her urine pregnancy test in the office was negative. Unknown LMP but patient has been endorsing mroe frequent menses (q 3 weeks). The etiology of her low FHS, LH is unclear at this point - precepted with Dr. [place].   Plan: Recheck FSH, LH - please ask about LMP; recheck TSH, free T4, free T3; check BHCG quant. Future order completed.  I will discuss this with the physician that will be covering my box for the next week.  The patient may also opt to make an appointment with me when I get back and then get labs.  Helane Rima DO  August 22, 2010 11:21 AM   APPEND:  Patient call clinic  and I was able to speak with her re: above. She will come in next week for labs. Helane Rima DO  August 22, 2010 11:30 AM                   Orders: Added new Test order of LH-[facility] [phone]) - Signed Added new Test order of FSH-[facility] [phone]) - Signed Added new Test order of TSH-[facility] [phone]) - Signed Added new Test order of Free T3-[facility] [phone]) - Signed Added new Test order of Free T4-[facility] [phone]) - Signed Added new Test order of B-HCG Quant-[facility] ([id number]) - Signed  Appended Document: Orders Update pls fax labs to fax [phone]

## 2010-08-31 NOTE — Telephone Encounter (Signed)
I do not know that results of the Holter Monitor yet.  Her repeat labs were very similar to her original labs. I cannot explain them yet, but will discuss the results with a specialist before our visit and we can discuss them at that time.  Please send the patient a copy of her labs.

## 2010-08-31 NOTE — Assessment & Plan Note (Signed)
Summary: REMOVE HOLTER MONITOR/KH  Holter Monitor removed . Patient obtained urine specimen for Upreg as ordered by Dr. Earlene Plater. Theresia Lo RN  August 22, 2010 10:32 AM   Holter Monitor  taken to hospital. Theresia Lo RN  August 22, 2010 11:29 AM  Allergies: No Known Drug Allergies   Complete Medication List: 1)  Alprazolam 1 Mg Tabs (Alprazolam) .... One by mouth up to two times a day as needed for anxiety  Other Orders: No Charge Patient Arrived (NCPA0) (NCPA0)   Orders Added: 1)  No Charge Patient Arrived (NCPA0) [NCPA0]    Laboratory Results   Urine Tests  Date/Time Received: August 22, 2010 10:29 AM  Date/Time Reported: August 22, 2010 10:58 AM     Urine HCG: negative Comments: ...............test performed by......Marland KitchenBonnie A. [place], MLS (ASCP)cm

## 2010-08-31 NOTE — Assessment & Plan Note (Signed)
Summary: night sweats/eo   Vital Signs:  Patient profile:   35 year old female Height:      66.5 inches Weight:      115 pounds BMI:     18.35 Pulse rate:   102 / minute BP sitting:   116 / 78  (left arm) Cuff size:   regular  Vitals Entered By: Tessie Fass CMA (August 21, 2010 9:29 AM) CC: chest pains, light headed Is Patient Diabetic? No Pain Assessment Patient in pain? no        Primary Care Provider:  Helane Rima DO  CC:  chest pains and light headed.  History of Present Illness: 35 yo F, with complicated medical Hx (see last OV note), presenting with:  1. CP - intermittent, non-exertional episodes of CP (midsternum with radiation to back, associated with "air constriction," the feeling of a "hard heartbeat," and a "pass out feeling"). She feels that her heart is racing at those times. This has happened while driving and lying down. Worse over past 2 weeks. Sister and mother with "tachyarrhythmia" per patient. Patient has tried taken Xanax during these episodes to see if it helps (previously Rx by Dr. Lillie Columbia) without relief. Episodes last for several minutes. Denies HA, N/V/D/C, LE edema, rash, LOC. She has issues with weight loss - has been taking Zyprexa "on and off" (samples given by previous doctor) and eating every 3-5 hours to keep her weight up. No known episodes of hypoglycemia. Previous CBC, CMP, TSH, SED, CT scan all WNL. Patient also with night sweats - not new. She endorses changes in menses over the past several months - menses q 2-3 weeks, painful sex 2/2 vaginal dryness last year (not sexually active now). Hx fibroids. + smoker.  Habits & Providers  Alcohol-Tobacco-Diet     Tobacco Status: current     Tobacco Counseling: to quit use of tobacco products     Cigarette Packs/Day: 1.0  Current Medications (verified): 1)  Alprazolam 1 Mg  Tabs (Alprazolam) .... One By Mouth Up To Two Times A Day As Needed For Anxiety  Allergies (verified): No Known Drug  Allergies  Past History:  Past Medical History: Last updated: 05/18/2010 Migraine HA Smoker Positive PPD Weight loss Left upper lobe cavity LUL endobronchial lesion with lymphadenopathy    - anthracotic pigment on biopsy seen but no malignancy or organism isolated Stage III Melanoma Hx Thyroid Nodules Hemorroids  Past Surgical History: Last updated: 05/18/2010 Bronchoscopy Mediastinoscopy Tubal Ligation  Family History: Last updated: 05/18/2010 Positive for CAD, not for lymphoma.  Social History: Last updated: 05/18/2010 Smoker. Has two children. Occasional alcohol use. Denies recreational drugs.   Risk Factors: Alcohol Use: <1 (09/22/2008) Caffeine Use: yes (09/22/2008) Exercise: yes (09/22/2008)  Risk Factors: Smoking Status: current (08/21/2010) Packs/Day: 1.0 (08/21/2010) PMH-FH-SH reviewed for relevance  Review of Systems      See HPI  Physical Exam  General:  Thin, in no acute distress; alert, appropriate and cooperative throughout examination. Vitals reviewed. Neck:  No deformities, masses, or tenderness noted. Lungs:  Normal respiratory effort, chest expands symmetrically. Lungs are clear to auscultation, no crackles or wheezes. Heart:  Normal rate and regular rhythm. S1 and S2 normal without gallop, murmur, click, rub or other extra sounds. Pulses:  R and L, dorsalis pedis and posterior tibial pulses are full and equal bilaterally. Extremities:  No edema. Skin:  Intact without suspicious lesions or rashes. Cervical Nodes:  No lymphadenopathy noted. Axillary Nodes:  No palpable lymphadenopathy. Inguinal Nodes:  No significant adenopathy. Psych:  Oriented X3, memory intact for recent and remote, and good eye contact. Slightly pressure speech.   Impression & Recommendations:  Problem # 1:  PALPITATIONS (ICD-785.1) Assessment New Will evaluate for cardiac etiology of complaints with Holter monitor. No red flags on exam today. 11/11 EKG NSR with no  concerns. Advised to stop Zyprexa until we evaluate. Orders: 24 Hr Holter (24 Hr Holter) [facility]- Est  Level 4 (41660)  Problem # 2:  CHEST PAIN (ICD-786.50) Assessment: Unchanged See #1. Recent CT negative. Also see last OV for previous discussion of DDx.  Orders: [facility]- Est  Level 4 (63016)  Problem # 3:  IRREGULAR MENSES (ICD-626.4) Assessment: New Unlikely, but night sweats may be hot flashes, associated with vaginal dryness and menses changes. Changes may also be related to low weight. Orders: FSH-[facility] ([id number]) LH-[facility] ([id number]) [facility]- Est  Level 4 (62376)  Complete Medication List: 1)  Alprazolam 1 Mg Tabs (Alprazolam) .... One by mouth up to two times a day as needed for anxiety  Other Orders: Comp Met-[facility] ([id number]) CBC w/Diff-[facility] (07371)  Patient Instructions: 1)  It was nice to see you today. 2)  We are getting labs and getting you a Holter Monitor. Prescriptions: ALPRAZOLAM 1 MG  TABS (ALPRAZOLAM) one by mouth up to two times a day as needed for anxiety  #30 x 0   Entered and Authorized by:   Helane Rima DO   Signed by:   Helane Rima DO on 08/21/2010   Method used:   Print then Give to Patient   RxID:   [id number]    Orders Added: 1)  Comp Met-[facility] [[id number]] 2)  CBC w/Diff-[facility] [85025] 3)  FSH-[facility] [[id number]] 4)  LH-[facility] [[id number]] 5)  24 Hr Holter [24 Hr Holter] 6)  [facility]- Est  Level 4 [71696]

## 2010-08-31 NOTE — Telephone Encounter (Signed)
Called pt and lmvm that the results are not back yet and to sched. appt with dr. Earlene Plater to f/up Arlyss Repress

## 2010-09-05 ENCOUNTER — Telehealth: Payer: Self-pay | Admitting: Family Medicine

## 2010-09-05 NOTE — Telephone Encounter (Signed)
Pt wants to know if holter results have come in yet?

## 2010-09-05 NOTE — Telephone Encounter (Signed)
Patient requesting results from holter monitor.Busick, Rodena Medin

## 2010-09-06 NOTE — Telephone Encounter (Signed)
No results yet. Could you look into this? I expected results to be back by now.

## 2010-09-06 NOTE — Telephone Encounter (Signed)
Results from holter monitor placed in your box.

## 2010-09-06 NOTE — Telephone Encounter (Signed)
Called and requested results from holter monitor. They will fax today.

## 2010-09-07 ENCOUNTER — Encounter: Payer: Self-pay | Admitting: *Deleted

## 2010-09-07 ENCOUNTER — Telehealth: Payer: Self-pay | Admitting: *Deleted

## 2010-09-07 NOTE — Telephone Encounter (Signed)
Called pt and lmvm holter test normal Arlyss Repress

## 2010-09-07 NOTE — Telephone Encounter (Signed)
Reviewed Holter Monitor results. Please call the patient to let her know that it was normal.

## 2010-09-07 NOTE — Telephone Encounter (Signed)
Called pt and lmvm 'holter results are normal' [name]

## 2010-09-08 ENCOUNTER — Ambulatory Visit (INDEPENDENT_AMBULATORY_CARE_PROVIDER_SITE_OTHER): Payer: Self-pay | Admitting: Family Medicine

## 2010-09-08 ENCOUNTER — Encounter: Payer: Self-pay | Admitting: Family Medicine

## 2010-09-08 ENCOUNTER — Telehealth: Payer: Self-pay | Admitting: *Deleted

## 2010-09-08 VITALS — BP 112/68 | HR 85 | Wt 114.0 lb

## 2010-09-08 DIAGNOSIS — R079 Chest pain, unspecified: Secondary | ICD-10-CM

## 2010-09-08 DIAGNOSIS — R42 Dizziness and giddiness: Secondary | ICD-10-CM

## 2010-09-08 NOTE — Telephone Encounter (Signed)
Called pt and informed of appointments for 2D echo and MRI 09/14/2010. Echo is at 10am and MRI 3pm..Marland KitchenBusick, Rodena Medin

## 2010-09-11 ENCOUNTER — Encounter: Payer: Self-pay | Admitting: Family Medicine

## 2010-09-11 NOTE — Assessment & Plan Note (Signed)
Patient with several vague symptoms and abnormal FSH, LH. Will check CT head for sellar mass.

## 2010-09-11 NOTE — Progress Notes (Signed)
  Subjective:    Patient ID: [name], female    DOB: Oct 01, 1975, 35 y.o.   MRN: [id number]  HPI Patient presenting today for a follow-up. She was seen 2 weeks ago for:  1. CP - intermittent, non-exertional episodes of CP (midsternum with radiation to back, associated with "air constriction," the feeling of a "hard heartbeat," and a "pass out feeling"). She feels that her heart is racing at those times. This has happened while driving and lying down. Worse over past few weeks. Sister and mother with "tachyarrhythmia" per patient. Patient has tried taken Xanax during these episodes to see if it helps (previously Rx by Dr. Lillie Columbia) without relief. Episodes last for several minutes. Denies HA, N/V/D/C, LE edema, rash, LOC. No known episodes of hypoglycemia. Previous CBC, CMP, TSH, SED, CT scan all WNL. Patient also with night sweats - not new. She endorses changes in menses over the past several months - menses q 2-3 weeks, painful sex 2/2 vaginal dryness last year (not sexually active now). Hx fibroids. + smoker.  A 24 hour Holter Monitor was ordered along with TSH, T3, T4, FSH, LH. Results: TSH 0.513, fT3 2.6, T4 12.6, FSH 0.5, LH 0.2 (note FSH and LH repeated to verify), Upreg and subsequent BHCG Quant negative. Holter Monitor showed NSR with some sinus tachycardia. Endorsed symptoms (see above) were noted to be during times of NSR.   Today, the patient endorses continued symptoms. She also endorses some intermittent mild HAs and 1-2 episodes of blurry/double vision. No other neurological s/s. Patient also endorses Hx of murmur - underwent 2DECHO yearly as a child.   Review of Systems See HPI.    Objective:   Physical Exam  Constitutional: Vital signs are normal. She appears well-developed and well-nourished. No distress.  Eyes: EOM are normal. Pupils are equal, round, and reactive to light.  Neck: No mass and no thyromegaly present.  Cardiovascular: Normal rate, regular rhythm, S1 normal, S2  normal and normal pulses.   Murmur heard.  Systolic murmur is present with a grade of 2/6       No LE edema.  Pulmonary/Chest: Effort normal and breath sounds normal.  Abdominal: Soft. Normal appearance and bowel sounds are normal.  Neurological: She is alert.  Skin: Skin is warm.  Psychiatric: Her behavior is normal. Judgment and thought content normal. Her mood appears anxious. Her speech is rapid and/or pressured. Cognition and memory are normal.          Assessment & Plan:

## 2010-09-11 NOTE — Assessment & Plan Note (Signed)
Holter Monitor reassuring. Patient with murmur, Hx of regular 2DECHO. Will re-evaluate with echo. No red flags today.

## 2010-09-14 ENCOUNTER — Ambulatory Visit (HOSPITAL_COMMUNITY)
Admission: RE | Admit: 2010-09-14 | Discharge: 2010-09-14 | Disposition: A | Discharge status: Home / Self Care | Payer: Self-pay | Source: Ambulatory Visit | Attending: Family Medicine | Admitting: Family Medicine

## 2010-09-14 DIAGNOSIS — R9431 Abnormal electrocardiogram [ECG] [EKG]: Secondary | ICD-10-CM | POA: Insufficient documentation

## 2010-09-14 DIAGNOSIS — E236 Other disorders of pituitary gland: Secondary | ICD-10-CM | POA: Insufficient documentation

## 2010-09-14 DIAGNOSIS — R42 Dizziness and giddiness: Secondary | ICD-10-CM | POA: Insufficient documentation

## 2010-09-14 DIAGNOSIS — C439 Malignant melanoma of skin, unspecified: Secondary | ICD-10-CM | POA: Insufficient documentation

## 2010-09-14 DIAGNOSIS — R51 Headache: Secondary | ICD-10-CM | POA: Insufficient documentation

## 2010-09-14 DIAGNOSIS — F17200 Nicotine dependence, unspecified, uncomplicated: Secondary | ICD-10-CM | POA: Insufficient documentation

## 2010-09-14 DIAGNOSIS — Z8249 Family history of ischemic heart disease and other diseases of the circulatory system: Secondary | ICD-10-CM | POA: Insufficient documentation

## 2010-09-14 MED ORDER — GADOBENATE DIMEGLUMINE 529 MG/ML IV SOLN
10.0000 mL | Freq: Once | INTRAVENOUS | Status: AC
  Administered 2010-09-14: 10 mL via INTRAVENOUS

## 2010-09-19 ENCOUNTER — Telehealth: Payer: Self-pay | Admitting: Family Medicine

## 2010-09-19 NOTE — Telephone Encounter (Signed)
Pt asking for test results for Mri & echo, wants them faxed to her at [phone]

## 2010-09-19 NOTE — Telephone Encounter (Signed)
Forward to [facility], patient wants results.

## 2010-09-20 NOTE — Telephone Encounter (Signed)
Please fax MRI results. No ECHO results yet. MRI only showing cyst.

## 2010-09-20 NOTE — Telephone Encounter (Signed)
See below

## 2010-09-20 NOTE — Telephone Encounter (Signed)
Faxed results for echo and mri to patient, results were in for echo.

## 2010-09-27 ENCOUNTER — Other Ambulatory Visit (HOSPITAL_COMMUNITY)
Admission: RE | Admit: 2010-09-27 | Discharge: 2010-09-27 | Disposition: A | Discharge status: Home / Self Care | Payer: Self-pay | Source: Ambulatory Visit | Attending: Family Medicine | Admitting: Family Medicine

## 2010-09-27 ENCOUNTER — Telehealth: Payer: Self-pay | Admitting: Family Medicine

## 2010-09-27 ENCOUNTER — Encounter: Payer: Self-pay | Admitting: Family Medicine

## 2010-09-27 ENCOUNTER — Ambulatory Visit (INDEPENDENT_AMBULATORY_CARE_PROVIDER_SITE_OTHER): Payer: Self-pay | Admitting: Family Medicine

## 2010-09-27 VITALS — BP 115/77 | HR 82 | Temp 99.2°F | Ht 65.0 in | Wt 114.0 lb

## 2010-09-27 DIAGNOSIS — Z01419 Encounter for gynecological examination (general) (routine) without abnormal findings: Secondary | ICD-10-CM | POA: Insufficient documentation

## 2010-09-27 DIAGNOSIS — N926 Irregular menstruation, unspecified: Secondary | ICD-10-CM

## 2010-09-27 DIAGNOSIS — Z20828 Contact with and (suspected) exposure to other viral communicable diseases: Secondary | ICD-10-CM

## 2010-09-27 DIAGNOSIS — Z124 Encounter for screening for malignant neoplasm of cervix: Secondary | ICD-10-CM

## 2010-09-27 DIAGNOSIS — E349 Endocrine disorder, unspecified: Secondary | ICD-10-CM

## 2010-09-27 DIAGNOSIS — R079 Chest pain, unspecified: Secondary | ICD-10-CM

## 2010-09-27 LAB — CONVERTED CEMR LAB: HIV: NONREACTIVE

## 2010-09-27 NOTE — Telephone Encounter (Signed)
Wants to go ahead and be referred to Endo and she will worry about bills.

## 2010-09-27 NOTE — Patient Instructions (Addendum)
It was nice to see you today!  I am referring you to Endocrine and for a stress test.  I am checking a Prolactin level today.

## 2010-09-28 ENCOUNTER — Encounter: Payer: Self-pay | Admitting: Family Medicine

## 2010-09-28 NOTE — Telephone Encounter (Signed)
I am happy will that decision and will refer.

## 2010-09-30 LAB — URINALYSIS, ROUTINE W REFLEX MICROSCOPIC
Glucose, UA: NEGATIVE mg/dL
pH: 5.5 (ref 5.0–8.0)

## 2010-09-30 LAB — POCT I-STAT, CHEM 8
Chloride: 101 mEq/L (ref 96–112)
HCT: 43 % (ref 36.0–46.0)
Potassium: 3.6 mEq/L (ref 3.5–5.1)
Sodium: 136 mEq/L (ref 135–145)

## 2010-09-30 LAB — PREGNANCY, URINE: Preg Test, Ur: NEGATIVE

## 2010-10-04 ENCOUNTER — Encounter: Payer: Self-pay | Admitting: Family Medicine

## 2010-10-05 ENCOUNTER — Encounter: Payer: Self-pay | Admitting: Family Medicine

## 2010-10-05 NOTE — Assessment & Plan Note (Addendum)
24 Holter very reassuring, especially since she endorsed cardiac symptoms while found to be in NSR. Patient still very concerned and endorsing symptoms. Will refer to Dr. Darrick Penna to see if a stress test is needed.  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 Patient would like referral to ENDOCRINE. I think that this is needed as well. Patient with no insurance and understands that the cost will be out of pocket.

## 2010-10-05 NOTE — Progress Notes (Signed)
  Subjective:    Patient ID: [name], female    DOB: [birth year], 35 y.o.   MRN: [id number]  HPI  Patient presenting today for a follow-up, to review MRI and ECHO. Hx of complaint is as follows:  1. CP: intermittent, non-exertional episodes of CP (midsternum with radiation to back, associated with "air constriction," the feeling of a "hard heartbeat," and a "pass out feeling"). She feels that her heart is racing at those times. This has happened while driving and lying down. Worse over past few weeks. Sister and mother with "tachyarrhythmia" per patient. Patient has tried taken Xanax during these episodes to see if it helps (previously Rx by Dr. Lillie Columbia) without relief. Episodes last for several minutes. Denies HA, N/V/D/C, LE edema, rash, LOC. No known episodes of hypoglycemia. Previous CBC, CMP, TSH, SED, CT scan all WNL. Patient also with night sweats - not new. She endorses changes in menses over the past several months - menses q 2-3 weeks, painful sex 2/2 vaginal dryness last year (not sexually active now). Hx fibroids. + smoker.   A 24 hour Holter Monitor was ordered along with TSH, T3, T4, FSH, LH. Results: TSH 0.513, fT3 2.6, T4 12.6, FSH 0.5, LH 0.2 (note FSH and LH repeated to verify), Upreg and subsequent BHCG Quant negative. Holter Monitor showed NSR with some sinus tachycardia. Endorsed symptoms (see above) were noted to be during times of NSR.   After 2 more weeks, the patient endorsed continued symptoms. She also endorsed some intermittent mild HAs and 1-2 episodes of blurry/double vision. No other neurological s/s. Patient also endorsed Hx of murmur - underwent 2DECHO yearly as a child.  An MRI and ECHO were ordered. MRI: No significant abnormality the brain. 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 ECHO:  Left ventricle: The cavity size was normal. Systolic function was normal. The estimated ejection fraction was in the range of 60% to 65%. Wall motion was  normal; there were no regional wall motion abnormalities. Valves were normal.   2. Preventive Medicine: Patient due for PAP.  Review of Systems SEE HPI.    Objective:   Physical Exam  Constitutional: She is oriented to person, place, and time. She appears well-developed and well-nourished. No distress.  Eyes: EOM are normal. Pupils are equal, round, and reactive to light.  Cardiovascular: Normal rate, regular rhythm and normal heart sounds.   Pulmonary/Chest: Effort normal and breath sounds normal.  Abdominal: Soft. Bowel sounds are normal.  Genitourinary: Vagina normal.  Neurological: She is alert and oriented to person, place, and time. She has normal reflexes.  Psychiatric:       Patient seems anxious. Slightly pressured speech.      Assessment & Plan:

## 2010-10-05 NOTE — Assessment & Plan Note (Signed)
Patient with irregular menses and abnormal FSH, LH. Negative pregnancy. Consider referral to GYN.

## 2010-10-06 ENCOUNTER — Telehealth: Payer: Self-pay | Admitting: Family Medicine

## 2010-10-06 NOTE — Telephone Encounter (Signed)
Message copied by Helane Rima on Fri Oct 06, 2010 12:15 PM ------      Message from: Helane Rima      Created: Thu Oct 05, 2010  1:43 PM       Refer to Endocrine.

## 2010-10-06 NOTE — Progress Notes (Signed)
This encounter was created in error - please disregard.

## 2010-10-06 NOTE — Progress Notes (Signed)
Addended by: Helane Rima on: 10/06/2010 02:43 PM   Modules accepted: Orders

## 2010-10-26 ENCOUNTER — Telehealth: Payer: Self-pay | Admitting: Family Medicine

## 2010-10-26 DIAGNOSIS — N926 Irregular menstruation, unspecified: Secondary | ICD-10-CM

## 2010-10-26 DIAGNOSIS — E349 Endocrine disorder, unspecified: Secondary | ICD-10-CM

## 2010-10-26 NOTE — Telephone Encounter (Signed)
Got endo appt but did not Cards and GYN Also needs results of labs

## 2010-10-27 NOTE — Telephone Encounter (Signed)
Called pt and informed of pap wnl and hiv neg. Pt said, that she had gc/chlamydia test too.  Idon't see it in her chart. About pt's referrals: 1.) she has appt with Endocrinologist next week, but has to r/s it because she is unable to keep appt in am's. (referral was through [facility]) Pt was told by Dr.Wallace, that she also would need 2.)  gyn referral. Will ask Dr.Wallace to put in referral and we can send referral to [facility] 3.) ETT. Dr.Wallace has to sent a request to [facility]. They will review it and then [facility] will sched. The appt. Fwd. To Dr.Wallace. Arlyss Repress

## 2010-10-27 NOTE — Telephone Encounter (Signed)
Referral and letter completed.

## 2010-10-30 ENCOUNTER — Telehealth: Payer: Self-pay | Admitting: *Deleted

## 2010-10-30 NOTE — Telephone Encounter (Signed)
Pt is aware that we sent request to [facility] for appt. Arlyss Repress

## 2010-11-20 ENCOUNTER — Telehealth: Payer: Self-pay | Admitting: Family Medicine

## 2010-11-20 NOTE — Telephone Encounter (Signed)
Pt says endocrinologist is requesting several labs to be ordered & drawn here, would like RN to call so she can give the list her other MD gave her.

## 2010-11-21 NOTE — Telephone Encounter (Signed)
Spoke with Molly Maduro. Patient must get lab order from endocrine and go to [facility]. It is not our policy to order labs per outside physicians and I do not feel comfortable being responsible for those labs.

## 2010-11-21 NOTE — Telephone Encounter (Signed)
Hey Dr Earlene Plater, please see me about this patient. I told her that we do not do outside labs here unless you want to order them yourself? She read off a list of lab orders that her endocrinologist says she was supposed to had done before she was seen by him.[name], [name]

## 2010-11-21 NOTE — Telephone Encounter (Signed)
Called patient to explain that we do not do outside labs, says she was supposed to of had the labs done by Dr Earlene Plater before she was sent to the endocrinologist. Told her I would look in to it and give her a call back. Patient became very upset when I asked her which labs she was needing done, says she already gave the list to someone and has been waiting for an appointment with the lab. Told her I would call her back once I figured this out.[name], Rodena Medin

## 2010-11-22 ENCOUNTER — Other Ambulatory Visit: Payer: Self-pay | Admitting: Obstetrics and Gynecology

## 2010-11-22 ENCOUNTER — Telehealth: Payer: Self-pay | Admitting: *Deleted

## 2010-11-22 ENCOUNTER — Ambulatory Visit (HOSPITAL_COMMUNITY)
Admission: RE | Admit: 2010-11-22 | Discharge: 2010-11-22 | Disposition: A | Discharge status: Home / Self Care | Payer: Self-pay | Source: Ambulatory Visit | Attending: Obstetrics and Gynecology | Admitting: Obstetrics and Gynecology

## 2010-11-22 ENCOUNTER — Encounter: Payer: Self-pay | Admitting: Obstetrics and Gynecology

## 2010-11-22 DIAGNOSIS — D259 Leiomyoma of uterus, unspecified: Secondary | ICD-10-CM

## 2010-11-22 DIAGNOSIS — N949 Unspecified condition associated with female genital organs and menstrual cycle: Secondary | ICD-10-CM | POA: Insufficient documentation

## 2010-11-22 DIAGNOSIS — N938 Other specified abnormal uterine and vaginal bleeding: Secondary | ICD-10-CM | POA: Insufficient documentation

## 2010-11-22 DIAGNOSIS — D219 Benign neoplasm of connective and other soft tissue, unspecified: Secondary | ICD-10-CM

## 2010-11-22 NOTE — Telephone Encounter (Signed)
Called patient to inform her that we could not do labs that were ordered by someone outside of this practice. Patient said that she was already made aware of that. Patient also apologized for being rude on the phone yesterday.Busick, Rodena Medin

## 2010-11-22 NOTE — Telephone Encounter (Signed)
Received a call from [place] at [facility].  She was wanting to know if Dr. Earlene Plater had referred patient to cardiology.  According to the patient that's what she was told at the last OV back in March.  I could not find a referral to Cardiology.  I did see that she had a Echo performed as well as referral to GYN and Endocrinology.   Bonita Quin is saying that the physician seeing her at the clinic now wants her referred to Cardiology asap.  They will go ahead and take care of it.  Linda scheduled her with [facility] for 5/21.  I will fax the OV notes for the 3/14 OV.

## 2010-11-23 ENCOUNTER — Other Ambulatory Visit (HOSPITAL_COMMUNITY): Payer: Self-pay

## 2010-11-23 NOTE — Group Therapy Note (Signed)
NAME:  [name], [name] NO.:  [id number]  MEDICAL RECORD NO.:  [id number]           PATIENT TYPE:  A  LOCATION:  [facility]                   FACILITY:  [facility]  PHYSICIAN:  Argentina Donovan, MD        DATE OF BIRTH:  03-23-1976  DATE OF SERVICE:  11/22/2010                                 CLINIC NOTE  The patient is a 35 year old gravida 2, para 2-0-0-2 who has a child 14 and another 61.  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  Wide excision was done and they thought they had, had her cure.  They used no chemotherapy nor radiation followup.  She began having cough after coming from a [place] trip to [place] and they diagnosed an abscess of some type at the apex of her left lung.  They removed it with wedge resection and she said that there was lots of lymph nodes and pus in the lung.  Since that time she has had a lot of night sweats that were terrible.  She has chest pain for which she wore a Holter and had some irregularity, which she was told was normal.  She had a gastroenterologist who finally made the diagnosis of the lung problem. 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  She has been on Ortho-Evra for a long time because of dysfunctional bleeding with dysmenorrhea and she takes it for 3 weeks and 3 months at a time and gets a break, but she never lasts that long without having breakthrough bleeding.  Every 2 or 3 weeks, she says she passes bloody mucus.  She states she has got a vague history of fibroids.  I am going to get an ultrasound of pelvis.  I am going to have her referred to cardiologist.  In the interim, I am going to give her 20 mg b.i.d. of a beta-blocker for the next 2 weeks as a trial to see if it reduces  her cardiac symptoms.  She has a family history of cardiac disease and her dad died at very young age.  In addition to this, her endocrinologist has given her some lab tests to get done, the [facility] and estradiol, they were done at the family practice were probably low because she has been on Evra patches and I told her that, that was not a valid test as long as she is taking estrogen so today she is taking the patch off and in 1 week we are going to get a [facility] and estradiol level as he requested.  He also wanted to get a ACTH level one at 8 o'clock a.m. cortisol level which will go ahead and have her get in our lab upstairs and I am going to have her come back for the ultrasound results in a couple weeks and see how she has done on the beta-blocker  and hopefully have some report of a cardiologist by that time.  IMPRESSION:  Night sweats, pulmonary etiology apparently ruled out, abdominal bloating.  Consider Gastroenterology referral again after ultrasound.  Dysfunctional uterine bleeding, on an Ortho-Evra patch. Labs will be pending.          ______________________________ Argentina Donovan, MD    PR/MEDQ  D:  11/22/2010  T:  11/23/2010  Job:  [id number]

## 2010-11-23 NOTE — Telephone Encounter (Signed)
Spoke with patient she is having the labs done by another physician.[name], [name]

## 2010-11-28 NOTE — Letter (Signed)
June 03, 2008   Oretha Milch, MD  [address] [place], [place] [address]   Re:  Stephanie, [name]              DOB:  10-20-1975   Dear Dr. Vassie Loll:   I saw the patient in the office today.  This is a 35 year old Asian  woman who has had episode of some night sweats and decreased appetite,  and lost 15 pounds since January.  She also has had a big abdominal  workup as well as a thyroid workup.  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  CT scan showed a left upper lobe mass with some AP window node and  probably underwent a bronchoscopy, which was nondiagnostic.   MEDICATIONS:  She is presently on no medication.   ALLERGIES:  She has no allergies.   FAMILY HISTORY:  Positive for cardiac disease and tachycardia.   SOCIAL HISTORY:  She is divorced.  She has 2 children, works in  Manufacturing engineer.  She smokes one pack of cigarettes a day.  She  does not drink alcohol on a regular basis.   REVIEW OF SYSTEMS:  She is 107 pounds, 5 feet 5 inches.  She had weight  loss.  No shortness of breath or palpitations.  No angina.  Pulmonary:  No hemoptysis or asthma.  GI:  See history of present illness.  She has  been treated for GERD.  GU:  She has had frequent urination.  No kidney  disease.  Vascular:  No claudication, DVT, or TIAs.  Neurological:  She  has had dizziness, black outs, and headaches.  No seizures.  Musculoskeletal:  No arthritis or joint pain.  Psychiatric:  No  depression or nervousness.  Eyes/ENT:  No change in her eyesight or  hearing.  Hematological:  No problems with bleeding or clotting  disorders.   PHYSICAL EXAMINATION:  Her pulmonary function test showed an FVC of 3.67  with an FEV1 of 2.60.   VITAL SIGNS:  Blood pressure is 120/80, pulse 100, respirations 18, and  sats were 99%.  HEAD, EYES, EARS, NOSE, AND THROAT:  Unremarkable.  NECK:  Supple without thyromegaly.  There is no supraclavicular or  axillary adenopathy.  CHEST:  Clear to auscultation and  percussion.  HEART:  Regular sinus rhythm.  No murmurs.  ABDOMEN:  Soft.  There is no hepatosplenomegaly.  EXTREMITIES:  Pulses are 2+ with no clubbing or edema.  NEUROLOGIC:  She is oriented x3.  Sensory and motor intact.  Cranial  nerves intact.   I think this is a very perplexing situation.  She has had extensive  workup with GI and thyroid and autoimmune with no obvious diagnosis.  The only abnormality is the multiple lobe lesion with AP window  adenopathy and some questionable mediastinal adenopathy.  A PET scan  could be obtained, but this would probably be positive whether it is  malignant or inflammatory.  I had a long discussion with her, and I  think the only way to know for sure is to see with some type of biopsy  of the left upper lobe lesion and nodes via the VATS approach.  I would  like to repeat her CT scan in about 4-5 weeks after her original CT scan  to see if there is any change before proceeding with surgery.  I  have  tentatively scheduled her to see her December 7 or 8 with a repeat CT  scan.  She will  see Infectious Disease, Dr. Daiva Eves.  She should have  the results of the cultures for a TB and fungus that were done at  bronchoscopy.  If there is no change in her CT scan, then I will have it  tentatively scheduled for June 28, 2008, for left VATS, wedge  resection of the left upper lobe lesion and AP window biopsy __________  removal.  I appreciate the opportunity of seeing the patient.   Sincerely,   Ines Bloomer, M.D.  Electronically Signed   DPB/MEDQ  D:  06/03/2008  T:  06/04/2008  Job:  [id number]   cc:   Acey Lav, MD

## 2010-11-28 NOTE — Letter (Signed)
June 30, 2008   Oretha Milch, MD  [address] [place], [place] [address]   Re:  [name], [name]              DOB:  March 29, 1976   Dear Kathy Breach:   I saw the patient back today.  Her bronchoscopy at the time we saw, no  evidence of any intrabronchial lesions.  We did do brushings up in that  area, which were completely benign.  We also did cultures up in that  area.  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  We also sent some of these for cultures just to be on the  safe side.  So whatever inflammatory problems she has, it appears to be  resolved.  Her mediastinoscopy wound was healing satisfactory and I told  her that I would see her back again in 3 weeks with another chest x-ray.  Her blood pressure was 125/81, pulse 80, respirations 16, and sats were  96%.   Ines Bloomer, M.D.  Electronically Signed   DPB/MEDQ  D:  06/30/2008  T:  07/01/2008  Job:  [id number]   cc:   Acey Lav, MD

## 2010-11-28 NOTE — Letter (Signed)
June 22, 2008   Oretha Milch, MD  [address] [place], [place] [address]   Re:  NETHA, DAFOE              DOB:  1975-08-13   Dear Dr. Vassie Loll:   I saw the patient back in the office today and on her CT scan, although  we do not have official reading, looks like it has marked improvement in  the left upper lobe lesion.  Her nodes appeared to be about the same.  Although, the AP window nodes may be decreased.  Her blood pressure is  109/75, pulse 78, respirations 18, and sats were 90%.  I do not think  doing a VATS on her will help, may getting the diagnosis and see there  what is going on, has improved.  We could do mediastinoscopy since she  does have some enlarged nodes.  We are not sure if there will probably  be a very low yield as far as her diagnosis.  I will see what Dr. Daiva Eves as to say and then rediscuss it with her after his opinion.  I  appreciate the opportunity of seeing the patient.   Ines Bloomer, M.D.  Electronically Signed   DPB/MEDQ  D:  06/22/2008  T:  06/23/2008  Job:  [id number]

## 2010-11-28 NOTE — Consult Note (Signed)
NAME:  [name], [name]                  ACCOUNT NO.:  [id number]   MEDICAL RECORD NO.:  [id number]          PATIENT TYPE:  INP   LOCATION:  1339                         FACILITY:  [facility]   PHYSICIAN:  Oretha Milch, MD      DATE OF BIRTH:  [birth year]   DATE OF CONSULTATION:  DATE OF DISCHARGE:                                 CONSULTATION   PRIMARY CARE PHYSICIAN:  Dr. Stefanie Bates.   GASTROENTEROLOGIST:  Dr. Alphonzo Lemmings.   REQUESTING PHYSICIAN:  [facility].   REASON FOR CONSULTATION:  Rule out tuberculosis.   HISTORY OF PRESENT ILLNESS:  [name] is a pleasant 35 year old  Caucasian woman who has been suffering from a mild illness since January  2009.  It started with night sweats and decreased appetite and she has  lost about 16 pounds from 123 in January to about 107 now.  She noted a  change in her bowel habits and some bloating of her stomach.  An  ultrasound of the abdomen and pelvis showed a simple cyst in her left  ovary.  Blood counts showed mild leukocytosis and TSH was normal.  Dr.  Alphonzo Lemmings performed an upper and lower endoscopy due to diarrhea and this  was nondiagnostic.  Apparently, she also had normal thyroid celiac  testing and autoimmune testing.  I do not know the details of the  autoimmune testing.  She also had a mole removed from her back which was  shown to be a dysplastic lesion.  A CT of the abdomen did not show any  significant lymphadenopathy.  A PPD test was placed and because this was  marginally positive at 10 mm she was sent to the [facility] for further  evaluation.  Workup here so far has shown a negative HIV test.  Negative  blood cultures.  AFB specimens x3 have been negative.  Sputum culture  was nondiagnostic and LDH was 135.  Chest x-ray showed air space opacity  in the medial left upper lobe without cavitation.  A CT chest showed  there to be a central left upper lobe opacity with 1.1 x 0.9 cm.  __________.  No hilar lymphadenopathy was noticed.  MRI of  the brain and  spinal cord was normal.  Two subcentimeter right thyroid lobe nodules  were noted which were too small for histologic sampling.   PAST MEDICAL HISTORY:  Described above.   PAST SURGICAL HISTORY:  Skin biopsies for nevus and moles.  __________.   OB HISTORY:  G2.  P0.  Has clamps on both her tubes.   SOCIAL HISTORY:  Smokes about a pack a day.  Used to drink alcohol in  the past.  She was laid off earlier this year from an [employer].  Prior to that, she used to work in a [employer] fast streamer.  She had a friend who came to the [place] __________ who was an  immigrant to the [place] __________and apparently was treated for  tuberculosis after arrival whom she was close to and traveled with to  [place] for a week and another  9 days __________.   FAMILY HISTORY:  Mother has COPD and coronary artery disease.  A cousin  was diagnosed with Crohn disease.   HOME MEDICATIONS:  Pancreatic enzymes __________.   ALLERGIES:  NONE.   PHYSICAL EXAM:  Adult woman sitting up in bed in no apparent respiratory  distress.  Alert and oriented __________.  Blood pressure 133/85.  Respirations 12 per minute.  Percent saturation 99% on room air.  HEENT:  Normal.  NECK:  Supple.  No JVD.  No lymphadenopathy.  CVS:  S1 and S2, normal.  CHEST:  Clear to auscultation.  ABDOMEN:  Soft, nontender.  No organomegaly.  EXTREMITIES:  No edema.   LAB DATA:  WBC count 9.1, hemoglobin 13.7, platelets __________, BUN and  creatinine __________, bicarbonate 25, sodium 138, potassium 4.0,  calcium 9.2.  Peripheral smear showed atypical lymphocytes and large  __________.   IMPRESSION:  1. 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  2. Weight loss, night sweats with negative gastrointestinal evaluation      as described.  3. PPD positive.  4. Leukocytosis with atypical lymphocytes on peripheral smear.   RECOMMEND:  1. I would agree  with checking serology for ANCA although the urine      sediment is planned and an ESR.  Autoimmune panel can be checked      based on what has already been checked by Dr. Alphonzo Lemmings.  I would      recommend starting with an ANA and an RA factor.  A bronchoscopy      was discussed with the patient in great detail.  Risks of the      procedure including coughing, bleeding, __________were discussed in      detail.  This will be performed tomorrow and a transbronchial      biopsy will be obtained from the left upper lobe.  If this is      nondiagnostic __________ then further options would include a      mediastinoscopic biopsy.  2. I would also recommended a hematology evaluation of the peripheral      smear given her __________leukocytosis at that point.  Further      differential diagnosis would also include a vasculitis involving      the lung and the bowel.   Thank you for involving [place] in the care of this pleasant patient.  We  will assist with her management during her hospital stay and after.      Oretha Milch, MD  Electronically Signed     RVA/MEDQ  D:  05/20/2008  T:  05/20/2008  Job:  [id number]   cc:   [name]  Fax: [phone]   Dr. Alphonzo Lemmings

## 2010-11-28 NOTE — Op Note (Signed)
NAME:  [name], [name] NO.:  [id number]   MEDICAL RECORD NO.:  [id number]          PATIENT TYPE:  INP   LOCATION:  2899                         FACILITY:  [facility]   PHYSICIAN:  Ines Bloomer, M.D. DATE OF BIRTH:  [birth year]   DATE OF PROCEDURE:  DATE OF DISCHARGE:  06/28/2008                               OPERATIVE REPORT   PREOPERATIVE DIAGNOSIS:  Left upper lobe probable inflammatory lesion,  mediastinal adenopathy.   POSTOPERATIVE DIAGNOSIS:  Left upper lobe probable inflammatory lesion,  mediastinal adenopathy.   OPERATION PERFORMED:  Video bronchoscopy, mediastinoscopy.   After adequate general anesthesia, a video bronchoscope was passed  through the endotracheal tube.  The carina was in the midline.  The  right upper lobe, right middle lobe, and right lower lobe orifices were  normal.  The left main stem, bronchus was normal.  The left lower lobe  was normal.  The left upper lobe, the orifices appeared to be normal,  but under fluoroscopic guidance we did brushings and washing from the  left upper lobe.  The video bronchoscope was removed.  The anterior neck  was prepped and draped in the usual sterile manner.  Transverse incision  was made and was carried down with electrocautery to subcutaneous tissue  and fascia.  Pretracheal fascia was entered and biopsies of 2R, 4R, and  4L nodes were done with cultures taken at the nodes.  Strap muscles was  closed with 2-0 Vicryl, subcutaneous tissue 3-0 Vicryl and Dermabond for  the skin.  The patient was returned to the recovery room in stable  condition.      Ines Bloomer, M.D.  Electronically Signed     DPB/MEDQ  D:  06/28/2008  T:  06/29/2008  Job:  [id number]   cc:   Ines Bloomer, M.D.

## 2010-11-28 NOTE — Op Note (Signed)
NAME:  [name], [name]                  ACCOUNT NO.:  [id number]   MEDICAL RECORD NO.:  [id number]          PATIENT TYPE:  INP   LOCATION:                               FACILITY:  [facility]   PHYSICIAN:  Oretha Milch, MD      DATE OF BIRTH:  February 25, 1976   DATE OF PROCEDURE:  05/21/2008  DATE OF DISCHARGE:  05/22/2008                               OPERATIVE REPORT   PROCEDURE PERFORMED:  Bronchoscopy.   INDICATION FOR THE PROCEDURE:  Left upper lobe air space disease and  mediastinal lymphadenopathy in this 35 year old smoker with weight loss,  night sweats, and concern for tuberculosis.   Written informed consent was obtained from the patient prior to the  procedure.  Risks of the procedure including coughing, bleeding, and a  small chance of lung puncture requiring a chest tube were discussed in  quite detail.  A 5 mg of Versed and 125 mcg of fentanyl were used in  divided dose during the procedure.  Bronchoscope was inserted from the  left nare.  Upper airway appeared normal.  Vocal cords showed normal  appearance and motion.  The segmental and subsegmental airways on the  right side and in the left lingula and lower lobe were all normal.  In  the left upper lobe, a whitish endobronchial lesion was visualized in  the anterior segment and one of the subsegments of the anterior segment.  This was brushed, washed, and biopsied.  Pictures were taken and placed  in the patient's chart.  There was minimal bleeding and the patient  tolerated the procedure well.  A chest x-ray postprocedure did not show  any presence of pneumothorax.  Brushings were also sent for AFB.      Oretha Milch, MD  Electronically Signed     RVA/MEDQ  D:  05/24/2008  T:  05/25/2008  Job:  [id number]

## 2010-11-28 NOTE — Consult Note (Signed)
NAME:  [name], [name]                  ACCOUNT NO.:  [id number]   MEDICAL RECORD NO.:  [id number]          PATIENT TYPE:  INP   LOCATION:  1339                         FACILITY:  [facility]   PHYSICIAN:  Rose Phi. Myna Hidalgo, M.D. DATE OF BIRTH:  [birth year]   DATE OF CONSULTATION:  05/21/2008  DATE OF DISCHARGE:                                 CONSULTATION   REFERRING PHYSICIAN:  Michelene Gardener, MD.   REASON FOR CONSULTATION:  1. Atypical lymphocytes seen on peripheral smear.  2. Weight loss.  3. Night sweats.   HISTORY OF PRESENT ILLNESS:  [name] is a 35 year old white female who  seems to be in pretty decent shape.  She unfortunately has had several  issues that have been going on, necessitating her being in the hospital.  She has been seen by about 4 or 5 doctors already.  She began losing  weight since January.  She lost her job in January.  She has had some  night sweats.  She has lost about 16 pounds.   As I said before, she has seen multiple doctors.  She has had numerous  tests done.   She has had a CT of the abdomen and pelvis in the past.  This did not  show anything else unusual.   There was a CT of the chest done.  This apparently showed a left upper  lobe opacity.   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   She has been x-rayed and had extensive labs.  So far all of her cultures  are negative.   She has not had any bleeding.  She has had diarrhea.  The patient has  had GI evaluations.  She has had endoscopies.  There is no evidence to  support celiac sprue, no other GI pathology.  She had a colonoscopy  which, again, was negative for any type of inflammatory bowel disease.   She apparently has had leukocytosis as an outpatient.  Of course, there  are no records of outpatient labs for me to look at.   Blood work done in the hospital May 17, 2008 showed a white cell  count of 17.6, hemoglobin 14, hematocrit 41, platelet count 209.   On May 18, 2008 a CBC  was done which showed a white count of 8.6,  hemoglobin 13, hematocrit 39, platelet count 183.  She has had a normal  LDH.  She had normal and negative HIV titers, hepatitis panel, which  evidently which have all been negative.  She has been on antibiotics.   Today, her white cell count was 6.8, hemoglobin 14, hematocrit 42,  platelet count was 202.  MCV was 91.  White cell differential:  53 segs,  37 lymphs, 9 monos, 2 eosinophils.   She has had an ANA which was negative, rheumatoid factor which was  negative, beta HCG which was negative.   A sed rate has been done, which was 8.  She has had a cryptococcal  antigen titer, which was negative.  Pneumocystis smear was negative.  TSH was 0.751.  Again, we were asked to see her because of the abnormality on a  peripheral smear noted by a lab tech.   On her chest CT scan done on May 17, 2008, it does show an increased  left mediastinal lymph node measuring 1.1 x 0.9 cm, a central left upper  lobe density with adjacent adenopathy.  It does not mention how large  this density is.   Her past medical history is unrevealing.   HER ALLERGIES ARE NONE.   ADMISSION MEDICATIONS:  None.   SOCIAL HISTORY:  She does have a 1-pack-per-day history of tobacco use.  There is no obvious alcohol use.  She has no obvious occupational  exposures.   FAMILY HISTORY:  Remarkable for coronary artery disease.  There is no  obvious history of blood disorder or cancer in the family.   REVIEW OF SYSTEMS:  As stated in the history of present illness.   PHYSICAL EXAM:  This is a thin white female in no obvious distress.  VITAL SIGNS:  98.5, pulse 78, respiratory 20, blood pressure 132/76.  Head and neck exam shows a normocephalic, atraumatic skull.  There are  no ocular or oral lesions.  There are no palpable cervical or supraclavicular lymph nodes.  The  thyroid is not palpable.  Lungs are clear to percussion and auscultation bilaterally.  CARDIAC  EXAM:  Regular rate and rhythm with a normal S1 and S2.  No  murmurs, rubs or bruits.  ABDOMEN:  Soft with good bowel sounds.  There is no palpable abdominal  mass.  There is no palpable hepatosplenomegaly.  Axillary exam shows no bilateral axillary adenopathy.  Inguinal exam  shows no inguinal adenopathy.  EXTREMITIES:  Show no clubbing, cyanosis or edema.  She has good range  of motion of her joints.  She has good muscle tone in the upper and  lower extremities.  NEUROLOGICAL EXAM:  No focal neurological deficits.  SKIN:  Exam shows no rashes, ecchymosis or petechiae.   A peripheral blood smear shows normochromic, normocytic population of  red blood cells.  There is no rouleaux formation.  I see no  schistocytes.  There are no nucleated red blood cells.  I see no  teardrop red cells.  White cells appear normal in number.  She has good  maturation of her polys.  I do not see any hypersegmented polys.  She  does have a few large lymphocytes.  She does have normal-appearing  lymphocytes.  She does have a couple of monocytes.  There are a few  macrophages.  Platelets are adequate in number and size.  She does have  few large platelets.   IMPRESSION:  [name] is a 35 year old female with a constellation of  symptoms.  She seems have some kind of systemic problem going on.  It is  certainly difficult to say what this might be.  All of her testing came  back negative today.   Her white cell count was elevated on admission.  However, this has come  down with antibiotics.  All of her cultures have been negative.  She, I  think, was treated with Rocephin and Zithromax.  It is possible that  this may have eradicated some type of occult infection.   I forgot to mention that she did have a bronchoscopy done today.  The  bronchoscopy was done, which appears to show a left upper lobe segment  that appears to be obstructed by a whitish lesion.  Transbronchial  biopsies were  taken.  Brushings  and lavage were also done.   I really cannot say that she has an underlying hematologic issue.  I  would be shocked if she did.  The blood smear certainly could be  reactive.  If she has an atypical type of infection, then one would  expect to see atypical lymphocytes.   I certainly do not see how a bone marrow biopsy is going to help [place] out  right now.  I think that would be an unnecessary procedure that would  certainly be uncomfortable for her.   It is certainly possible she may have some kind of vasculitis.  I could  not imagine this being malignant.  I could not imagine this being a  lymphoma or other lymphoproliferative process.   I certainly would not recommend any kind of PET scan on her.   I think we will just have to follow her along.  Will certainly await the  results of these biopsies.   It certainly was nice to see Ms. Chaudhari.  She is very, very nice.  I  talked with her and her parents.      Rose Phi. Myna Hidalgo, M.D.  Electronically Signed     PRE/MEDQ  D:  05/21/2008  T:  05/21/2008  Job:  [id number]   cc:   Michelene Gardener, MD   Acey Lav, MD  Fax: [phone]   Oretha Milch, MD  [address] [place] [address]   Jilda Roche, MD  [facility]   [facility]  Fax: [phone]

## 2010-11-28 NOTE — Letter (Signed)
July 21, 2008   Oretha Milch, MD  [address] [place] [address]   Re:  JOSILYN, SHIPPEE              DOB:  1975/12/03   Dear Kathy Breach,   The patient came today and her mediastinoscopy site was healing without  problems.  Her chest x-ray showed that this left upper lobe lesion had  pretty much cleared.  There was no active disease.  She is feeling much  better.  Her blood pressure was 110/72, pulse 88, respirations 16, and  sats were 98%.  We will see her back again if she has any future  problems.   Ines Bloomer, M.D.  Electronically Signed   DPB/MEDQ  D:  07/21/2008  T:  07/21/2008  Job:  [id number]

## 2010-11-28 NOTE — H&P (Signed)
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   HISTORY OF PRESENT ILLNESS:  Ms. Howley is a 35 year old female without  any significant past medical history who was referred to the emergency  room for evaluation and management of positive PPD test.  The patient  has been following with her primary care physician, gastroenterologist  for several months for an 76-month history of cough, night sweats,  diarrhea and significant weight loss.  The patient states her symptoms  started this January.  She was having severe night sweats in the middle  of the night, usually from 9 o'clock to 4 o'clock.  She gets soaking wet  and her bed sheets and clothes, everything gets soaked.  She needs to  change her clothes to sleep again.  She was also having cough, mostly  dry, sometimes productive of a small amount of pink yellowish phlegm.  She thought this was because of her smoking history.  She was also  having diarrhea, bloating sensation and other GI symptoms in the past 10  months.  Initially, she used to have bloating sensation and feels full  all the time.  Subsequently, she started having diarrhea.  She says she  is trying to eat as much as she can and she does have good oral intake,  still she has been losing weight.  She lost approximately 16 pounds  since January, and she is trying to drink Ensure milkshakes eating well;  still she is unable to gain any weight.  Denies having any recurrent  episodes of fever, however, she was told to have low-grade temperatures.   REVIEW OF SYSTEMS:  The patient states she was having on and off  headaches.  A  few months ago, she had red tinged vision of a few hours,  subsequently it became normal and no other visual abnormalities were  noted.  Denies having any hearing problems.  Denies having any dysphagia  or dysarthria.  No chest pain, no dyspnea, no orthopnea or paroxysmal  nocturnal dyspnea.  She feels tired  and her activity as decreased in  the recent past.  She says that she was also becoming forgetful.  She  states that she was not able to cough __________ before.  Denies having  any abdominal pain.  Denies having any nausea or vomiting.  Tolerates a  diet well.  Having 3-4 loose bowel movements per day.  Denies having any  abdominal cramps.  She states during her last menstrual period she had  abnormal clot/discharge, but denies having any foul smelling vaginal  discharge.  Denies having any lower extremity edema or tenderness.  Other review of systems were asked and were negative.   PAST MEDICAL HISTORY:  None.  However, she has been following with her  medical doctor and gastroenterologist for her abdominal symptoms and  weight loss.  She underwent a CT of the abdomen, small bowel x-rays,  colonoscopy with biopsy approximately a month ago.  Her biopsy result  came back negative.  She was also tested for Crohn's disease, celiac  disease and hypothyroidism, and she was told all those tests were also  negative.   OBSTETRICAL HISTORY:  None.   SOCIAL HISTORY:  She smokes approximately 1 pack of cigarettes per day.  Denies any drug use.  She used to drink alcohol occasionally in the  past; however, the last few months she was getting more nausea and  vomiting with alcohol intake, so she stopped drinking.   FAMILY HISTORY:  Her mother has positive coronary artery disease.  Cancer, hypertension and diabetes runs in her family.  One of her  cousin's diagnosed with Crohn's disease.   HOME MEDICATIONS:  She was started on pancreatic enzymes in the recent  past.   ALLERGIES:   NONE.   PHYSICAL EXAMINATION:  GENERAL:  The patient is alert, awake and  oriented x3.  Not in any acute distress, comfortable.  A very pale  individual, no distress.  VITAL SIGNS:  Temperature 98.4, pulse 93, respirations 16, blood  pressure 136/91.  O2 saturation 100% on room air.  HEENT:  Head normocephalic, atraumatic.  Eyes; pupils equal and reactive  to light and accommodation.  No icterus was noted.  Oral cavity; moist  oral mucosa.  NECK:  Supple.  No JVD.  CHEST:  Bilateral fair air entry.  No crackles heard.  HEART:  S1-S2.  Regular rate and rhythm.  No murmurs were noted.  ABDOMEN:  Soft.  Bowel sounds positive.  Nontender, nondistended.  No  guarding or rigidity.  EXTREMITIES:  No edema.  CNS:  No focal motor or neuro deficits.  SKIN:  No rash or lesions were noted.  LYMPH NODES:  No lymphadenopathy was noted.   LABORATORY DATA:  1. Chest x-ray - questionable air space disease in the left upper lobe      region.  2. PPD is approximately 10 mm,size induration was noted.  3. White count 17.6, hemoglobin 14, hematocrit 41.3, platelets 209.      Sodium 138, potassium 3.7, chloride 105, bicarb 25, glucose 95, BUN      8, creatinine 0.8.   IMPRESSION:  1. 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  2. The patient states that she had been in [place] in 2006.  She      visited [place] and was diagnosed with tuberculosis and she was      treated for that.  However, the place she was in [place] was TB      prevalent area.  She was concerned about tuberculosis, and the      person she was with had a severe cough.  She is not really sure      whether he was completely treated for TB. With her questionable      history and positive PPD, will keep her under isolation.  Will      obtain a sputum for AFB x3.  Dr. Orvan Falconer, infectious disease      physician on-call was informed by the emergency department, will      follow early in the morning.   Will start the patient on IV Rocephin  and Zithromax empirically.  Will ask the respiratory therapist to      induce the sputum if needed.  Will also obtain a CT of the chest      for further evaluation of her lung process.  We need to follow her      liver function tests.      Estelle Grumbles, MD  Electronically Signed     TP/MEDQ  D:  05/17/2008  T:  05/17/2008  Job:  [id number]

## 2010-11-28 NOTE — Discharge Summary (Signed)
NAME:  [name], [name]                  ACCOUNT NO.:  [id number]   MEDICAL RECORD NO.:  [id number]          PATIENT TYPE:  INP   LOCATION:  1339                         [facility]ACILITY[facility]   PHYSICIAN:  Michelene Gardener, MD    DATE OF BIRTH:  [birth year]   DATE OF ADMISSION:  05/17/2008  DATE OF DISCHARGE:  05/22/2008                               DISCHARGE SUMMARY   DISCHARGE DIAGNOSES:  1. Left upper lobe opacification suspicious for TB (tuberculosis),      neoplasm or infectious process.  2. Positive PPD.  3. Weight loss.  4. Active tobacco abuse.  5. Right thyroid lobe nodules.  6. Leukocytosis with atypical lymphocytes and large platelets.   DISCHARGE MEDICATIONS:  The patient is to continue her home medications  that include:  1. Seasonal.  2. Lipase once a day.  3. Ibuprofen as needed.   CONSULTATIONS:  1. ID consult.  2. Pulmonary consult.  3. Hematology consult.   PROCEDURES:  Bronchoscopy.   RADIOLOGY STUDIES:  1. Chest x-ray on November 2 showed questionable airspace disease in      the left upper lobe.  2. CT scan of the chest with contrast on November 2 showed central      left lobe opacity with adjacent adenopathy that is suspicious for      active tuberculosis.  3. MRI of the brain with and without contrast showed normal findings.  4. MR of thoracic spine showed no acute findings.  5. Chest x-ray on November 6 showed medial left upper lobe infiltrate      without significant change, follow up with [facility] on      Monday.   HOSPITAL COURSE:  This is a 35 year old Caucasian female who has been  having increased symptoms with weight loss around 20 pounds since  January.  She has been having night sweats, dry cough.  The patient was  admitted for further evaluation.  PPD test was done and it was positive.  Chest x-ray was done and showed left upper lobe opacification.  CT scan  of the chest was done and showed the same opacification.  ID was  consulted.   Pulmonary was consulted where bronchoscopy was done and it  showed whitish lesion in the right upper lobe.  Biopsy and lavage was  taken from there.  AFB was taken x4 and that was negative.  Smear from  lavage negative for AFB.  The patient underwent multiple laboratory  tests that include LDH which is normal.  HIV test nonreactive.  Blood  cultures x2 were negative.  Sputum culture was negative.  Hepatitis was  negative.  TSH normal.  ESR normal.  Cryptococcal antigen normal.  Pneumocystis was negative.  Rheumatoid factor negative.  ANA negative.   This patient has still no clear diagnosis of her symptoms which seems to  be more systemic disease with possible vasculitis.  I had prolonged  discussion with her.  The patient would like to go home.  We will  discharge her today.  She will follow with [facility] on Monday  to get  results of her biopsy.  Further testing will be determined  depending on those findings.   Assessment time is 40 minutes.      Michelene Gardener, MD  Electronically Signed     NAE/MEDQ  D:  05/22/2008  T:  05/22/2008  Job:  [id number]

## 2010-11-29 ENCOUNTER — Other Ambulatory Visit: Payer: Self-pay

## 2010-11-29 DIAGNOSIS — Z0189 Encounter for other specified special examinations: Secondary | ICD-10-CM

## 2010-11-30 ENCOUNTER — Encounter: Payer: Self-pay | Admitting: Cardiology

## 2010-12-04 ENCOUNTER — Encounter: Payer: Self-pay | Admitting: Cardiology

## 2010-12-04 ENCOUNTER — Ambulatory Visit (INDEPENDENT_AMBULATORY_CARE_PROVIDER_SITE_OTHER): Payer: Self-pay | Admitting: Cardiology

## 2010-12-04 DIAGNOSIS — R079 Chest pain, unspecified: Secondary | ICD-10-CM

## 2010-12-04 DIAGNOSIS — R002 Palpitations: Secondary | ICD-10-CM

## 2010-12-04 DIAGNOSIS — F17200 Nicotine dependence, unspecified, uncomplicated: Secondary | ICD-10-CM

## 2010-12-04 DIAGNOSIS — Z8249 Family history of ischemic heart disease and other diseases of the circulatory system: Secondary | ICD-10-CM

## 2010-12-04 NOTE — Patient Instructions (Signed)
Your physician recommends that you schedule a follow-up appointment in:3 weeks with Dr. Shirlee Latch Your physician has recommended that you wear an event monitor. Event monitors are medical devices that record the heart's electrical activity. Doctors most often [place] these monitors to diagnose arrhythmias. Arrhythmias are problems with the speed or rhythm of the heartbeat. The monitor is a small, portable device. You can wear one while you do your normal daily activities. This is usually used to diagnose what is causing palpitations/syncope (passing out). Pt needs a 2 week monitor Your physician recommends that you return for fasting lab work.  Lipid, liver profile- 786.50

## 2010-12-05 ENCOUNTER — Telehealth: Payer: Self-pay

## 2010-12-05 DIAGNOSIS — Z8249 Family history of ischemic heart disease and other diseases of the circulatory system: Secondary | ICD-10-CM | POA: Insufficient documentation

## 2010-12-05 HISTORY — DX: Family history of ischemic heart disease and other diseases of the circulatory system: Z82.49

## 2010-12-05 NOTE — Assessment & Plan Note (Signed)
Patient has episodes lasting 1-2 minutes of heart racing/pounding and chest pain.  She wore a holter monitor but did not have symptoms while wearing it.  She had an essentially normal echo.  Propranolol has not helped.  - Given ongoing symptoms, I will have her wear an event monitor to see if we can document any SVT to explain the tachypalpitations.  - Mildly elevate free T4.  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

## 2010-12-05 NOTE — Assessment & Plan Note (Addendum)
Father with MI at 68 per patient.  I will check her lipids.  She needs to quit smoking.  No exertional symptoms.

## 2010-12-05 NOTE — Assessment & Plan Note (Signed)
Patient was strongly advised to stop smoking. 

## 2010-12-05 NOTE — Progress Notes (Signed)
PCP: Dr. Earlene Plater  35 yo with palpitations and chest pain presents for cardiology evaluation.  She has had a number of other medical problems worked, see PMH below.  Patient's main cardiac complaint is that she has episodes where she feels her heart race and pound.  These last for about 1-2 minutes and then resolve.  She gets chest tightness during these episodes.   There is no trigger to these symptoms.  They do not occur with exertion.  She has good exercise tolerance (can jog) without chest pain or dyspnea.  She wore a holter monitor in 3/12 that showed only sinus rhythm and mild sinus tachycardia.  She says that she did not have symptoms while wearing the monitor.  She was started on propranolol but this really has not helped much.  She had an echo in 3/12 that showed a structurally normal heart.   ECG: NSR, normal  Labs (2/12): K 4.2, creatinine 0.81 Labs (3/12): HIV negative, TSH normal, free T3 normal, free T4 mildly elevated  PMH: 1. Melanoma s/p excision 2. Active smoker: 1 ppd 3. Left upper lobe endobronchial lesion with lymphadenopathy.  No malignancy on biopsy, likely inflammatory.  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  5. + PPD 6. Palpitations: Holter (3/12) with sinus rhythm, some sinus tachycardia (24 hours).  7. Echo (3/12): EF 60-65%, normal wall motion.   SH: Patient smokes 1 ppd, works for [employer].   FH: Father with MI at 48, Mother and sister with "tachycardia"  ROS: All systems reviewed and negative except as per HPI with the addition only of bilateral calf pain/cramping with exertion.   Current Outpatient Prescriptions  Medication Sig Dispense Refill  . norelgestromin-ethinyl estradiol (ORTHO EVRA) 150-20 MCG/24HR Place 1 patch onto the skin once a week.        . propranolol (INDERAL) 20 MG tablet Take 20 mg by mouth 2 (two) times daily.          BP 118/66  Pulse 89  Ht 5' 4.5" (1.638 m)  Wt 112 lb 6.4 oz (50.984 kg)  BMI 19.00  kg/m2 General: NAD Neck: No JVD, no thyromegaly or thyroid nodule.  Lungs: Clear to auscultation bilaterally with normal respiratory effort. CV: Nondisplaced PMI.  Heart regular S1/S2, no S3/S4, no murmur.  No peripheral edema.  No carotid bruit.  Normal pedal pulses.  Abdomen: Soft, nontender, no hepatosplenomegaly, no distention.  Skin: Intact without lesions or rashes.  Neurologic: Alert and oriented x 3.  Psych: Normal affect. Extremities: No clubbing or cyanosis.  HEENT: Normal.

## 2010-12-06 ENCOUNTER — Other Ambulatory Visit: Payer: Self-pay | Admitting: *Deleted

## 2010-12-06 ENCOUNTER — Ambulatory Visit: Payer: Self-pay | Admitting: Obstetrics and Gynecology

## 2010-12-06 DIAGNOSIS — R1084 Generalized abdominal pain: Secondary | ICD-10-CM

## 2010-12-07 ENCOUNTER — Other Ambulatory Visit (INDEPENDENT_AMBULATORY_CARE_PROVIDER_SITE_OTHER): Payer: Self-pay | Admitting: *Deleted

## 2010-12-07 DIAGNOSIS — R079 Chest pain, unspecified: Secondary | ICD-10-CM

## 2010-12-07 LAB — HEPATIC FUNCTION PANEL
AST: 16 U/L (ref 0–37)
Albumin: 3.7 g/dL (ref 3.5–5.2)
Alkaline Phosphatase: 46 U/L (ref 39–117)
Total Protein: 6.9 g/dL (ref 6.0–8.3)

## 2010-12-07 LAB — LIPID PANEL
Cholesterol: 176 mg/dL (ref 0–200)
Triglycerides: 94 mg/dL (ref 0.0–149.0)

## 2010-12-07 NOTE — Group Therapy Note (Signed)
NAME:  [name], [name] NO.:  [id number]  MEDICAL RECORD NO.:  [id number]           PATIENT TYPE:  A  LOCATION:  [facility]                   FACILITY:  [facility]  PHYSICIAN:  Argentina Donovan, MD        DATE OF BIRTH:  1975-09-17  DATE OF SERVICE:  12/06/2010                                 CLINIC NOTE  The patient is a 35 year old gravida 2, para 2-0-0-2 with child age 2 and 38.  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  Wide incision was done, but no napping was carried out.  She was evaluated by GI and other specialists since that time.  Her complaints are multiple, but I think real.  She complains of extreme hot flashes.  FSH and LH evaluations were done on her previously and found to be normal.  She is going to see an endocrinologist for some of her other complaints, but some of the things that bother me are extreme abdominal bloating, which is tympanic, obvious to see, albeit that the patient is quite thin, 5 feet 4 inches, and 115 pounds, but appears today as if she is in advance pregnancy when just look at the bloating that I noticed when she was in here last time that she also had the same problem.  In addition, she has had severe leg pains when she walks. They are not exactly cramps she said, but it is soreness in her legs.  I have told her to stop the __________ patch that someone has put her on and see whether or not that resolves.  In addition, I think we have to get an oncologist to go over her previous history.  She has all those papers with her, and see whether that she needs any further followup on this melanoma that she had.  The night sweats.  She said she had a MRI of the lungs, and she had a partial pneumonectomy for apparent abscess several years ago, but she thinks it was only one lung.  So, I am going to get PA and lateral x-ray of the lungs.  She describes waking up in a sweat because she wets the pillows are wet  each morning.  Her complaints are numerous, but I think real.  She is terribly concerned about this previous melanoma because she just recently had a close friend who passed away from melanoma, and I think that this has affected the way she is feeling in regard to this.  So in conclusion with impression is post melanoma surgery, post partial pneumonectomy, abdominal bloating, very noticeable, new onset of leg pain.  Physical examination unrevealed for that point.  PLAN: 1. Refer to gastroenterologist to evaluate bloating. 2. Send to medical oncologist to go over her past history and see if     any further followup needs to be done with regard to her melanoma. 3. Chest x-ray to rule out any pulmonary possibility of causes for her     night sweats. 4. Continue followup with cardiologist for palpitations.          ______________________________ Argentina Donovan, MD  PR/MEDQ  D:  12/06/2010  T:  12/07/2010  Job:  [id number]

## 2010-12-08 ENCOUNTER — Other Ambulatory Visit: Payer: Self-pay | Admitting: Obstetrics and Gynecology

## 2010-12-08 ENCOUNTER — Ambulatory Visit (HOSPITAL_COMMUNITY)
Admission: RE | Admit: 2010-12-08 | Discharge: 2010-12-08 | Disposition: A | Discharge status: Home / Self Care | Payer: Self-pay | Source: Ambulatory Visit | Attending: Obstetrics and Gynecology | Admitting: Obstetrics and Gynecology

## 2010-12-08 DIAGNOSIS — R61 Generalized hyperhidrosis: Secondary | ICD-10-CM | POA: Insufficient documentation

## 2010-12-14 ENCOUNTER — Telehealth: Payer: Self-pay | Admitting: Cardiology

## 2010-12-14 NOTE — Telephone Encounter (Signed)
Pt aware.

## 2010-12-14 NOTE — Telephone Encounter (Signed)
Test result

## 2010-12-15 ENCOUNTER — Other Ambulatory Visit: Payer: Self-pay | Admitting: Hematology and Oncology

## 2010-12-15 ENCOUNTER — Encounter (HOSPITAL_BASED_OUTPATIENT_CLINIC_OR_DEPARTMENT_OTHER): Payer: Self-pay | Admitting: Oncology

## 2010-12-15 DIAGNOSIS — D485 Neoplasm of uncertain behavior of skin: Secondary | ICD-10-CM

## 2010-12-15 DIAGNOSIS — C439 Malignant melanoma of skin, unspecified: Secondary | ICD-10-CM

## 2010-12-15 DIAGNOSIS — C4359 Malignant melanoma of other part of trunk: Secondary | ICD-10-CM

## 2010-12-15 LAB — CBC WITH DIFFERENTIAL/PLATELET
BASO%: 0.5 % (ref 0.0–2.0)
EOS%: 0.5 % (ref 0.0–7.0)
HCT: 38.8 % (ref 34.8–46.6)
MCH: 30.7 pg (ref 25.1–34.0)
MCHC: 34.4 g/dL (ref 31.5–36.0)
MCV: 89.2 fL (ref 79.5–101.0)
MONO%: 3.8 % (ref 0.0–14.0)
NEUT%: 76.7 % (ref 38.4–76.8)
RDW: 13.3 % (ref 11.2–14.5)
lymph#: 1.8 10*3/uL (ref 0.9–3.3)

## 2010-12-15 LAB — COMPREHENSIVE METABOLIC PANEL
ALT: 9 U/L (ref 0–35)
AST: 14 U/L (ref 0–37)
Alkaline Phosphatase: 47 U/L (ref 39–117)
Calcium: 9.5 mg/dL (ref 8.4–10.5)
Chloride: 103 mEq/L (ref 96–112)
Creatinine, Ser: 0.85 mg/dL (ref 0.50–1.10)

## 2010-12-19 ENCOUNTER — Other Ambulatory Visit: Payer: Self-pay | Admitting: Hematology and Oncology

## 2010-12-19 ENCOUNTER — Encounter (HOSPITAL_COMMUNITY): Payer: Self-pay

## 2010-12-19 ENCOUNTER — Encounter (HOSPITAL_COMMUNITY)
Admission: RE | Admit: 2010-12-19 | Discharge: 2010-12-19 | Disposition: A | Discharge status: Home / Self Care | Payer: Self-pay | Source: Ambulatory Visit | Attending: Hematology and Oncology | Admitting: Hematology and Oncology

## 2010-12-19 DIAGNOSIS — K7689 Other specified diseases of liver: Secondary | ICD-10-CM | POA: Insufficient documentation

## 2010-12-19 DIAGNOSIS — M549 Dorsalgia, unspecified: Secondary | ICD-10-CM | POA: Insufficient documentation

## 2010-12-19 DIAGNOSIS — I700 Atherosclerosis of aorta: Secondary | ICD-10-CM | POA: Insufficient documentation

## 2010-12-19 DIAGNOSIS — R634 Abnormal weight loss: Secondary | ICD-10-CM | POA: Insufficient documentation

## 2010-12-19 DIAGNOSIS — C7800 Secondary malignant neoplasm of unspecified lung: Secondary | ICD-10-CM | POA: Insufficient documentation

## 2010-12-19 DIAGNOSIS — C4359 Malignant melanoma of other part of trunk: Secondary | ICD-10-CM

## 2010-12-19 DIAGNOSIS — R112 Nausea with vomiting, unspecified: Secondary | ICD-10-CM | POA: Insufficient documentation

## 2010-12-19 DIAGNOSIS — R197 Diarrhea, unspecified: Secondary | ICD-10-CM | POA: Insufficient documentation

## 2010-12-19 MED ORDER — IOHEXOL 300 MG/ML  SOLN
100.0000 mL | Freq: Once | INTRAMUSCULAR | Status: AC | PRN
  Administered 2010-12-19: 100 mL via INTRAVENOUS

## 2010-12-19 MED ORDER — FLUDEOXYGLUCOSE F - 18 (FDG) INJECTION
18.5000 | Freq: Once | INTRAVENOUS | Status: AC | PRN
  Administered 2010-12-19: 18.5 via INTRAVENOUS

## 2010-12-20 ENCOUNTER — Other Ambulatory Visit: Payer: Self-pay

## 2010-12-21 ENCOUNTER — Other Ambulatory Visit: Payer: Self-pay

## 2010-12-21 ENCOUNTER — Encounter (HOSPITAL_BASED_OUTPATIENT_CLINIC_OR_DEPARTMENT_OTHER): Payer: Self-pay | Admitting: Hematology and Oncology

## 2010-12-21 DIAGNOSIS — C439 Malignant melanoma of skin, unspecified: Secondary | ICD-10-CM

## 2010-12-21 DIAGNOSIS — Z0189 Encounter for other specified special examinations: Secondary | ICD-10-CM

## 2010-12-27 ENCOUNTER — Encounter (INDEPENDENT_AMBULATORY_CARE_PROVIDER_SITE_OTHER): Payer: Self-pay | Admitting: Thoracic Surgery

## 2010-12-27 DIAGNOSIS — D381 Neoplasm of uncertain behavior of trachea, bronchus and lung: Secondary | ICD-10-CM

## 2010-12-28 NOTE — Assessment & Plan Note (Signed)
OFFICE VISIT  Bertini, Shereece DOB:  04-25-1976                                        December 27, 2010 CHART #:  [id number]  Her blood pressure today was 112/76, pulse 76, respirations 18, sats 94%.  She has been on propranolol by Dr. Shirlee Latch.  In 2009, she had a level III melanoma that was treated in [place] with wide excision. Sampling at that time they were worried about of.  We saw her and did a bronchoscopy and mediastinoscopy for mediastinal adenopathy, which was completely benign.  When she was seen in [place] they were somewhat concerned about a left upper lobe nodule and did a right thoracotomy and apparently according to her a wedge got a left upper lobe lung abscess.  She now has a nodular 1.8 cm density in the area of her left upper lobe and a PET scan was positive of 5.7 on the SUV.  She is referred here for possible evaluation.  She has had some night sweats, fever, chills, and had extensive workup in [place] with no evidence after her excision of any fungus at least according to her history.  Given re-excision of this lesion would be a big operation because of scarring and the best thing that he need to do is try to do a needle biopsy and we will send this for culture at the same time.  I will plan to have this done and she also will be seeing Dr. Daiva Eves of Infectious Disease.  We will see her back again after a needle biopsy.  Ines Bloomer, M.D. Electronically Signed  DPB/MEDQ  D:  12/27/2010  T:  12/28/2010  Job:  [id number]

## 2010-12-29 ENCOUNTER — Other Ambulatory Visit: Payer: Self-pay | Admitting: Thoracic Surgery

## 2010-12-29 DIAGNOSIS — R911 Solitary pulmonary nodule: Secondary | ICD-10-CM

## 2011-01-03 ENCOUNTER — Ambulatory Visit (HOSPITAL_COMMUNITY)
Admission: RE | Admit: 2011-01-03 | Discharge: 2011-01-03 | Disposition: A | Discharge status: Home / Self Care | Payer: Self-pay | Source: Ambulatory Visit | Attending: Thoracic Surgery | Admitting: Thoracic Surgery

## 2011-01-03 ENCOUNTER — Other Ambulatory Visit: Payer: Self-pay | Admitting: Thoracic Surgery

## 2011-01-03 ENCOUNTER — Other Ambulatory Visit: Payer: Self-pay | Admitting: Interventional Radiology

## 2011-01-03 DIAGNOSIS — R918 Other nonspecific abnormal finding of lung field: Secondary | ICD-10-CM

## 2011-01-03 DIAGNOSIS — Z85820 Personal history of malignant melanoma of skin: Secondary | ICD-10-CM | POA: Insufficient documentation

## 2011-01-03 DIAGNOSIS — J984 Other disorders of lung: Secondary | ICD-10-CM | POA: Insufficient documentation

## 2011-01-03 DIAGNOSIS — Z9889 Other specified postprocedural states: Secondary | ICD-10-CM

## 2011-01-03 DIAGNOSIS — R911 Solitary pulmonary nodule: Secondary | ICD-10-CM

## 2011-01-03 DIAGNOSIS — R634 Abnormal weight loss: Secondary | ICD-10-CM | POA: Insufficient documentation

## 2011-01-03 LAB — CBC
HCT: 39.5 % (ref 36.0–46.0)
MCH: 30.2 pg (ref 26.0–34.0)
MCV: 87.2 fL (ref 78.0–100.0)
Platelets: 187 10*3/uL (ref 150–400)
RDW: 13 % (ref 11.5–15.5)

## 2011-01-03 LAB — APTT: aPTT: 26 seconds (ref 24–37)

## 2011-01-04 ENCOUNTER — Ambulatory Visit: Payer: Self-pay | Admitting: Cardiology

## 2011-01-06 LAB — TISSUE CULTURE
Culture: NO GROWTH
Gram Stain: NONE SEEN

## 2011-01-09 ENCOUNTER — Encounter: Payer: Self-pay | Admitting: Cardiology

## 2011-01-09 ENCOUNTER — Ambulatory Visit (INDEPENDENT_AMBULATORY_CARE_PROVIDER_SITE_OTHER): Payer: Self-pay | Admitting: Thoracic Surgery

## 2011-01-09 DIAGNOSIS — R599 Enlarged lymph nodes, unspecified: Secondary | ICD-10-CM

## 2011-01-10 NOTE — Telephone Encounter (Signed)
Patient refuse monitor due to no Insurance.

## 2011-01-10 NOTE — Letter (Signed)
January 09, 2011    Re:  ALEC, MCPHEE              DOB:  [birth year]    Ms. Muntean returns today.  She had some temperature and headaches and some mild hemoptysis over the weekend after a needle biopsy.  Her path shows no evidence of any cancer.  It is just inflammatory.  We will refer to Dr. Daiva Eves for his opinion.  Her temperature is 98.8 today. Her blood pressure is 125/82, pulse 85, respirations 16, sats are 95%. We will get a chest x-ray when she returns and also hope we that time check on her cultures.  Her white count was 10,200 on January 03, 2011.  Ines Bloomer, M.D. Electronically Signed  DPB/MEDQ  D:  01/09/2011  T:  01/10/2011  Job:  [id number]  cc:   Laurice Record, M.D. Acey Lav, MD

## 2011-01-15 ENCOUNTER — Encounter: Payer: Self-pay | Admitting: Infectious Disease

## 2011-01-15 ENCOUNTER — Other Ambulatory Visit: Payer: Self-pay | Admitting: Infectious Disease

## 2011-01-15 ENCOUNTER — Ambulatory Visit (INDEPENDENT_AMBULATORY_CARE_PROVIDER_SITE_OTHER): Payer: Self-pay | Admitting: Infectious Disease

## 2011-01-15 ENCOUNTER — Telehealth: Payer: Self-pay | Admitting: Pulmonary Disease

## 2011-01-15 VITALS — BP 148/103 | HR 80 | Temp 98.2°F | Ht 65.0 in | Wt 114.0 lb

## 2011-01-15 DIAGNOSIS — R7611 Nonspecific reaction to tuberculin skin test without active tuberculosis: Secondary | ICD-10-CM

## 2011-01-15 DIAGNOSIS — J852 Abscess of lung without pneumonia: Secondary | ICD-10-CM

## 2011-01-15 DIAGNOSIS — J849 Interstitial pulmonary disease, unspecified: Secondary | ICD-10-CM | POA: Insufficient documentation

## 2011-01-15 DIAGNOSIS — J984 Other disorders of lung: Secondary | ICD-10-CM

## 2011-01-15 DIAGNOSIS — J84115 Respiratory bronchiolitis interstitial lung disease: Secondary | ICD-10-CM

## 2011-01-15 HISTORY — DX: Abscess of lung without pneumonia: J85.2

## 2011-01-15 LAB — SEDIMENTATION RATE: Sed Rate: 1 mm/hr (ref 0–22)

## 2011-01-15 LAB — CBC WITH DIFFERENTIAL/PLATELET
Basophils Absolute: 0.1 10*3/uL (ref 0.0–0.1)
Basophils Relative: 1 % (ref 0–1)
Eosinophils Absolute: 0.1 10*3/uL (ref 0.0–0.7)
Hemoglobin: 13.5 g/dL (ref 12.0–15.0)
MCH: 29.3 pg (ref 26.0–34.0)
MCHC: 32.8 g/dL (ref 30.0–36.0)
Neutro Abs: 7.6 10*3/uL (ref 1.7–7.7)
Neutrophils Relative %: 72 % (ref 43–77)
Platelets: 248 10*3/uL (ref 150–400)

## 2011-01-15 LAB — COMPREHENSIVE METABOLIC PANEL
ALT: 9 U/L (ref 0–35)
AST: 15 U/L (ref 0–37)
Albumin: 4.2 g/dL (ref 3.5–5.2)
Alkaline Phosphatase: 52 U/L (ref 39–117)
Potassium: 3.8 mEq/L (ref 3.5–5.3)
Sodium: 138 mEq/L (ref 135–145)
Total Bilirubin: 0.3 mg/dL (ref 0.3–1.2)
Total Protein: 7 g/dL (ref 6.0–8.3)
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
## 2011-01-15 NOTE — Telephone Encounter (Signed)
Spoke with pt and sched her to see RA on 02/15/11- this date okay with pt, I had offerd her 01/30/11 in [place] and she had another appt this date. Will call sooner if feels this is needed. Will forward to Dr Vassie Loll, so he can be aware and look at notes from Dr. Daiva Eves.

## 2011-01-15 NOTE — Assessment & Plan Note (Signed)
See above discussion regarding pathology findings of interstitial fibrosis and history of exposure history of exposure to various inhalational and agents that could cause interstitial lung disease organizing pneumonia due to a noninfectious cause.

## 2011-01-15 NOTE — Assessment & Plan Note (Signed)
At some point she does need to be treated for latent tuberculosis but we first need to find out if there is any evidence of active tuberculosis.

## 2011-01-15 NOTE — Assessment & Plan Note (Signed)
Would really like to obtain the records from [facility]. The patient indeed had a lung abscess I would've expected her to have received antibiotics after resection of the abscess. Also be curious if any organisms were isolated from cultures obtained in the operating room she did show me a culture from sputum that showed Haemophilus influenza. I wonder whether AFB and fungal cultures have been sent. I I do wonder whether her area of hypermetabolic activity constitutes an infectious disease such as tuberculosis or fungal pathogen such as histoplasmosis, blastomycosis or cryptococcus. It would seem unusual however given her extensive workup in the past. Her symptoms of malaise and weight loss preceded her initial bronchoscopy by year by her account. All those cultures failed to grow any organism and the pathology at least per her report from [facility] did not suggest any of these infections. For thoroughness sake I will check a serum cryptococcal angina now expected to be negative also send a urine Histoplasma antigen urine Blastomyces antigens and [facility]  [place]. I will refer her back to Dr. Marko Stai because I do wonder if she does have a form of his interstitial lung disease with fibrosis found on pathology reported she does have a history of inhalational exposures. Varus therapies are not yet ready to pull the trigger on. Treatment for tuberculosis in part because I am still skeptical of this being the explanation for her symptoms.

## 2011-01-15 NOTE — Telephone Encounter (Signed)
Pl make sooner appt

## 2011-01-16 LAB — CRYPTOCOCCAL ANTIGEN: Crypto Ag: NEGATIVE

## 2011-01-16 NOTE — Telephone Encounter (Signed)
[email or web address] for appointment with RA January 18, 2011 at 3:30pm.

## 2011-01-16 NOTE — Telephone Encounter (Signed)
Is it okay to overbook you on 01/29/11? Is this too late? Pls advise thanks

## 2011-01-16 NOTE — Telephone Encounter (Signed)
Cancel smithson on 7/5 3.30 - I saw her already & schedule this pt please, JR.

## 2011-01-18 ENCOUNTER — Encounter: Payer: Self-pay | Admitting: Pulmonary Disease

## 2011-01-18 ENCOUNTER — Ambulatory Visit (INDEPENDENT_AMBULATORY_CARE_PROVIDER_SITE_OTHER): Payer: Self-pay | Admitting: Pulmonary Disease

## 2011-01-18 VITALS — BP 112/70 | HR 73 | Temp 98.2°F | Ht 65.0 in | Wt 112.8 lb

## 2011-01-18 DIAGNOSIS — J984 Other disorders of lung: Secondary | ICD-10-CM

## 2011-01-18 DIAGNOSIS — R7611 Nonspecific reaction to tuberculin skin test without active tuberculosis: Secondary | ICD-10-CM

## 2011-01-18 DIAGNOSIS — F17200 Nicotine dependence, unspecified, uncomplicated: Secondary | ICD-10-CM

## 2011-01-18 NOTE — Progress Notes (Signed)
  Subjective:    Patient ID: [name], female    DOB: 1976-06-15, 35 y.o.   MRN: [id number]  HPI 32/ F for FU of Left upper lobe air space disease and mediastinal lymphadenopathy.    She presented in  January 2009 with night sweats and decreased appetite and 16 pounds wt loss from 123 in January to about 107 in 11/09. She noted a change in her bowel habits and some bloating of her stomach. An ultrasound of the abdomen and pelvis showed a simple cyst in her left ovary. Blood counts showed mild leukocytosis and TSH was normal. Dr. Alphonzo Lemmings performed an upper and lower endoscopy due to diarrhea and this was nondiagnostic. Apparently, she also had normal thyroid celiac testing and autoimmune testing. She had an excision biopsy of a mole on her back that turned outt o be malignant melanoma, clark III & was re-excised. A CT of the abdomen did not show any significant lymphadenopathy. A PPD test was placed and was marginally positive at 10 mm .  CT chest  >> central left upper lobe opacity with 1.1 x 0.9 cm AP window LN. No hilar lymphadenopathy was noticed. MRI of the brain and spinal cord was normal. Two subcentimeter right thyroid lobe nodules were noted which were too small for histologic sampling.  ANA, RA ,Urine histo Ag neg, ANCA neg. ACE level was low Bronchoscopy 11/09>>In the left upper lobe, a whitish endobronchial lesion was visualized in the anterior segment and one of the subsegments of the anterior segment.  Nondiagnostic biopsy, afb cx neg  Rpt CT 12/09>> Significant improvement in the nodular opacity within the anteromedial left upper lobe most consistent with improving inflammatory process. Decrease in mediastinal nodes.  12/09 Bscopy neg, Mediastinoscopy neg  3/10 CT angio >> regressing LUL lesion, no PE She subsequently underwent VATS wedge resection at [facility]. She was lost to FU & then returned to Oncology ([facility]) who repeated imaging. This showed recurernt parenchymal opacities in the  LUL, SUV 5.7 - with mildly enalrged left apratracheal Lns , not hypermetabolic. CT guided LN biopsy was performed - neg for malignancy & afb & showed interstitial fibrosis.  Her main complaint is body aches & pain & weight loss & night sweats. Rpt labs show ESR of 1, low CRP & no leucocytosis She continues to smoke 7-8 cigs/d   Review of Systems Pt denies any significant  nasal congestion or excess secretions, fever, chills, sweats, unintended wt loss, pleuritic or exertional cp, orthopnea pnd or leg swelling.  Pt also denies any obvious fluctuation in symptoms with weather or environmental change or other alleviating or aggravating factors.    Pt denies any increase in rescue therapy over baseline, denies waking up needing it or having early am exacerbations or coughing/wheezing/ or dyspnea      Objective:   Physical Exam Gen. Pleasant, well-nourished, in no distress, normal affect ENT - no lesions, no post nasal drip Neck: No JVD, no thyromegaly, no carotid bruits Lungs: no use of accessory muscles, no dullness to percussion, clear without rales or rhonchi  Cardiovascular: Rhythm regular, heart sounds  normal, no murmurs or gallops, no peripheral edema Abdomen: soft and non-tender, no hepatosplenomegaly, BS normal. Musculoskeletal: No deformities, no cyanosis or clubbing Neuro:  alert, non focal        Assessment & Plan:

## 2011-01-18 NOTE — Telephone Encounter (Signed)
Spoke with pt and verified msg- she is able to come in today, so appt was sched for 3:30 pm.

## 2011-01-18 NOTE — Patient Instructions (Signed)
CT chest in 4 months OK to start INH in my opinion Not enough evidence to start steroid SMOKING CESSATION!

## 2011-01-19 ENCOUNTER — Encounter: Payer: Self-pay | Admitting: Pulmonary Disease

## 2011-01-19 NOTE — Assessment & Plan Note (Signed)
OK to use INH prophylaxis x 9 mnths

## 2011-01-19 NOTE — Assessment & Plan Note (Signed)
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 I doubt that this is infectious. TB  Or fungal etiology has been ruled out. Doubt sarcoid - no granulomas on multiple biopsies Will hold off on empiric prednisone - since her problems are mainly systemic - wt loss , night sweats & bodyaches. Lung function  appears ok. Will reimage in 4 mnths Smoking cessation apramount

## 2011-01-19 NOTE — Assessment & Plan Note (Signed)
Smoking cessation paramount here 

## 2011-01-22 ENCOUNTER — Other Ambulatory Visit: Payer: Self-pay | Admitting: Thoracic Surgery

## 2011-01-22 DIAGNOSIS — C3410 Malignant neoplasm of upper lobe, unspecified bronchus or lung: Secondary | ICD-10-CM

## 2011-01-23 ENCOUNTER — Ambulatory Visit
Admission: RE | Admit: 2011-01-23 | Discharge: 2011-01-23 | Disposition: A | Discharge status: Home / Self Care | Payer: Self-pay | Source: Ambulatory Visit | Attending: Thoracic Surgery | Admitting: Thoracic Surgery

## 2011-01-23 ENCOUNTER — Ambulatory Visit (INDEPENDENT_AMBULATORY_CARE_PROVIDER_SITE_OTHER): Payer: Self-pay | Admitting: Thoracic Surgery

## 2011-01-23 DIAGNOSIS — C3410 Malignant neoplasm of upper lobe, unspecified bronchus or lung: Secondary | ICD-10-CM

## 2011-01-23 DIAGNOSIS — D381 Neoplasm of uncertain behavior of trachea, bronchus and lung: Secondary | ICD-10-CM

## 2011-01-30 ENCOUNTER — Ambulatory Visit: Payer: Self-pay | Admitting: Pulmonary Disease

## 2011-01-31 LAB — FUNGUS CULTURE W SMEAR: Fungal Smear: NONE SEEN

## 2011-02-02 ENCOUNTER — Encounter: Payer: Self-pay | Admitting: Infectious Disease

## 2011-02-09 NOTE — Assessment & Plan Note (Signed)
OFFICE VISIT  Bates, Stephanie DOB:  May 09, 1976                                        January 23, 2011 CHART #:  [id number]  [name] came today and we got a chest x-ray that showed distant process in the left upper lobe is resolved.  He did see an area of staple line, so I would think that this probably was inflammatory process and has resolved as this goes along with the needle biopsy results.  I will refer her back to Dr. Daiva Eves and Dr. Reginia Naas care and we will see her again if there are any problems.  Her blood pressure is 143/97, pulse 83, respirations 20, sats were 98%.  Ines Bloomer, M.D. Electronically Signed  DPB/MEDQ  D:  01/23/2011  T:  01/24/2011  Job:  [id number]  cc:   Oretha Milch, MD Acey Lav, MD

## 2011-02-15 ENCOUNTER — Ambulatory Visit: Payer: Self-pay | Admitting: Pulmonary Disease

## 2011-02-15 LAB — AFB CULTURE WITH SMEAR (NOT AT ARMC)

## 2011-03-14 ENCOUNTER — Encounter: Payer: Self-pay | Admitting: Infectious Disease

## 2011-03-14 ENCOUNTER — Ambulatory Visit (INDEPENDENT_AMBULATORY_CARE_PROVIDER_SITE_OTHER): Payer: Self-pay | Admitting: Infectious Disease

## 2011-03-14 DIAGNOSIS — J852 Abscess of lung without pneumonia: Secondary | ICD-10-CM

## 2011-03-14 DIAGNOSIS — R7611 Nonspecific reaction to tuberculin skin test without active tuberculosis: Secondary | ICD-10-CM

## 2011-03-14 MED ORDER — ISONIAZID 300 MG PO TABS: ORAL_TABLET | ORAL | Status: DC

## 2011-03-14 NOTE — Assessment & Plan Note (Signed)
resolved 

## 2011-03-14 NOTE — Patient Instructions (Signed)
Please drop by the [facility] to pick up Isoniazid to treate latent Tuberculosis

## 2011-03-14 NOTE — Progress Notes (Signed)
Subjective:    Patient ID: [name], female    DOB: [birth year], 35 y.o.   MRN: [id number]  HPI  89 year old lady whom I met at [facility] on the consult in November 2009. At the time she had a several month history of night sweats, weight loss of over 16 pounds and unexplained leukocytosis to 18K . She had GI workup with colonoscopy and biopsies, EGD done by Dr. Alphonzo Lemmings, serologies, all of which were unrevealing. 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 While at [facility] she was ruled out for TB with serial sputa and bronchoscopy by Dr. Vassie Loll that revealed a white colored endobronchial lesion. Her cultures from BAL, sputa, biopsy have failed to grow fungi or TB, path was nondiagnsostic. HEr WBC had come down while on antibiotics in the hospital day one Urine histo ag, serum crypto ags, were negative, ANA and ANCA were negative. 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 In the interim she was discovered to have an abnormal lesion on PET scan and was taken to the operating room at [facility]. Apparently gross pus was encountered she underwent a partial pneumonectomy. She had been seen by Dr. Imogene Burn at [facility] prior to surgery. She states that she was not placed on antibiotics after surgery. I do not have records however of that surgery or the path report or any microbiological studies. This took place in 2010. In any case she felt quite well approximately 2 weeks after having the pneumonectomy and was feeling back to normal until last spring around February 2011 when she again began complaining of weight loss and drenching night sweats. She established care with family medicine here in [place]. And underwent a workup. She was found to have abnormal [place] beta hCG was initially thought to possibly be pregnant but  subsequent testing revealed a stenotic case. She has had workup done by Dr. Chestine Spore with endocrinology. In addition to the workup done by her primary care physicians in family medicine. 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 She states she's also been found a thyroid nodule but the biopsies been negative and her thyroid function tests were normal with the exception of her T3 which was slightly elevated. Since then she has undergone further workup by oncology including CT scans chest abdomen and pelvis. CT scan showed  abnormality along the site of her prior surgery her lungs. As well as one lesion within her liver. PET scan showed hypermetabolic areas along the area of her surgery in her lungs. She has since undergone CT-guided biopsy of this lesion. 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 AFB and fungal stains and cultures have been negative to date bacterial culture was negative. She is actually feeling better has gained 10 pounds back from a 20 pound that she lost since last February. She did when I last saw her continue to however to have night sweats and does occasionally have low-grade temperatures.  She saw [name] again and he was highly skeptical of any significant ILD. I had sent off fungal serologies which were negative as well. She is doing well continueing to feel well though she did have night sweating last night. She has not had a  measured fever. She has not seen her endocrinologist recently and clearlyneeds plug in with them. I recommended her starting INH via the [facility] and have written script for this for her.  Review of Systems  Constitutional: Negative for fever, chills, diaphoresis, activity change, appetite change, fatigue and unexpected weight change.  HENT: Negative for congestion, sore throat, rhinorrhea, sneezing, trouble swallowing and sinus pressure.   Eyes: Negative for  photophobia and visual disturbance.  Respiratory: Negative for cough, chest tightness, shortness of breath, wheezing and stridor.   Cardiovascular: Negative for chest pain, palpitations and leg swelling.  Gastrointestinal: Negative for nausea, vomiting, abdominal pain, diarrhea, constipation, blood in stool, abdominal distention and anal bleeding.  Genitourinary: Negative for dysuria, hematuria, flank pain and difficulty urinating.  Musculoskeletal: Negative for myalgias, back pain, joint swelling, arthralgias and gait problem.  Skin: Negative for color change, pallor, rash and wound.  Neurological: Negative for dizziness, tremors, weakness and light-headedness.  Hematological: Negative for adenopathy. Does not bruise/bleed easily.  Psychiatric/Behavioral: Negative for behavioral problems, confusion, sleep disturbance, dysphoric mood, decreased concentration and agitation.       Objective:   Physical Exam  Constitutional: She is oriented to person, place, and time. She appears well-developed and well-nourished. No distress.  HENT:  Head: Normocephalic and atraumatic.  Mouth/Throat: Oropharynx is clear and moist. No oropharyngeal exudate.  Eyes: Conjunctivae and EOM are normal. Pupils are equal, round, and reactive to light. No scleral icterus.  Neck: Normal range of motion. Neck supple. No JVD present.  Cardiovascular: Normal rate, regular rhythm and normal heart sounds.  Exam reveals no gallop and no friction rub.   No murmur heard. Pulmonary/Chest: Effort normal and breath sounds normal. No respiratory distress. She has no wheezes. She has no rales. She exhibits no tenderness.  Abdominal: She exhibits no distension and no mass. There is no tenderness. There is no rebound and no guarding.  Musculoskeletal: She exhibits no edema and no tenderness.  Lymphadenopathy:    She has no cervical adenopathy.  Neurological: She is alert and oriented to person, place, and time. She has normal  reflexes. She exhibits normal muscle tone. Coordination normal.  Skin: Skin is warm and dry. She is not diaphoretic. No erythema. No pallor.  Psychiatric: She has a normal mood and affect. Her behavior is normal. Judgment and thought content normal.          Assessment & Plan:

## 2011-03-14 NOTE — Assessment & Plan Note (Signed)
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 Will forward note to Ward Roxan Hockey

## 2011-04-17 LAB — AFB CULTURE WITH SMEAR (NOT AT ARMC)
Acid Fast Smear: NONE SEEN
Acid Fast Smear: NONE SEEN
Acid Fast Smear: NONE SEEN

## 2011-04-17 LAB — LACTATE DEHYDROGENASE: LDH: 135 U/L (ref 94–250)

## 2011-04-17 LAB — BASIC METABOLIC PANEL
BUN: 11 mg/dL (ref 6–23)
BUN: 6 mg/dL (ref 6–23)
CO2: 25 mEq/L (ref 19–32)
Calcium: 9.2 mg/dL (ref 8.4–10.5)
Creatinine, Ser: 0.91 mg/dL (ref 0.4–1.2)
GFR calc Af Amer: >60 mL/min (ref >60)
GFR calc Af Amer: >60 mL/min (ref >60)
GFR calc non Af Amer: >60 mL/min (ref >60)
GFR calc non Af Amer: >60 mL/min (ref >60)
GFR calc non Af Amer: >60 mL/min (ref >60)
Glucose, Bld: 88 mg/dL (ref 70–99)
Glucose, Bld: 95 mg/dL (ref 70–99)
Potassium: 3.7 mEq/L (ref 3.5–5.1)
Potassium: 4 mEq/L (ref 3.5–5.1)
Potassium: 4.1 mEq/L (ref 3.5–5.1)
Sodium: 138 mEq/L (ref 135–145)

## 2011-04-17 LAB — CBC
HCT: 41.3 % (ref 36.0–46.0)
HCT: 41.6 % (ref 36.0–46.0)
Hemoglobin: 13.1 g/dL (ref 12.0–15.0)
Hemoglobin: 14 g/dL (ref 12.0–15.0)
MCHC: 33.7 g/dL (ref 30.0–36.0)
MCV: 90.7 fL (ref 78.0–100.0)
Platelets: 190 10*3/uL (ref 150–400)
Platelets: 202 10*3/uL (ref 150–400)
RBC: 4.27 MIL/uL (ref 3.87–5.11)
RBC: 4.56 MIL/uL (ref 3.87–5.11)
RBC: 4.57 MIL/uL (ref 3.87–5.11)
RDW: 13.5 % (ref 11.5–15.5)
RDW: 13.7 % (ref 11.5–15.5)
WBC: 6.8 10*3/uL (ref 4.0–10.5)
WBC: 8.6 10*3/uL (ref 4.0–10.5)
WBC: 9.1 10*3/uL (ref 4.0–10.5)

## 2011-04-17 LAB — URINALYSIS, ROUTINE W REFLEX MICROSCOPIC
Bilirubin Urine: NEGATIVE
Ketones, ur: NEGATIVE mg/dL
Nitrite: NEGATIVE
Protein, ur: NEGATIVE mg/dL
Specific Gravity, Urine: >1.046 — ABNORMAL HIGH (ref 1.005–1.030)
Urobilinogen, UA: 1 mg/dL (ref 0.0–1.0)

## 2011-04-17 LAB — HISTOPLASMA ANTIGEN, URINE: Histoplasma Antigen, urine: <2 {EIA'U}

## 2011-04-17 LAB — CULTURE, RESPIRATORY W GRAM STAIN

## 2011-04-17 LAB — DIFFERENTIAL
Basophils Absolute: 0.1 10*3/uL (ref 0.0–0.1)
Eosinophils Relative: 0 % (ref 0–5)
Lymphocytes Relative: 12 % (ref 12–46)
Lymphocytes Relative: 37 % (ref 12–46)
Lymphs Abs: 2.5 10*3/uL (ref 0.7–4.0)
Monocytes Absolute: 1.1 10*3/uL — ABNORMAL HIGH (ref 0.1–1.0)
Monocytes Relative: 6 % (ref 3–12)
Neutrophils Relative %: 53 % (ref 43–77)

## 2011-04-17 LAB — TECHNOLOGIST SMEAR REVIEW

## 2011-04-17 LAB — EXPECTORATED SPUTUM ASSESSMENT W GRAM STAIN, RFLX TO RESP C

## 2011-04-17 LAB — FUNGUS CULTURE W SMEAR: Fungal Smear: NONE SEEN

## 2011-04-17 LAB — RHEUMATOID FACTOR: Rhuematoid fact SerPl-aCnc: <20 IU/mL (ref 0–20)

## 2011-04-17 LAB — CULTURE, BLOOD (ROUTINE X 2)
Culture: NO GROWTH
Culture: NO GROWTH

## 2011-04-17 LAB — HCG, QUANTITATIVE, PREGNANCY: hCG, Beta Chain, Quant, S: <2 m[IU]/mL (ref <5)

## 2011-04-17 LAB — P CARINII SMEAR DFA

## 2011-04-17 LAB — ANGIOTENSIN CONVERTING ENZYME: Angiotensin-Converting Enzyme: 16 U/L (ref 9–67)

## 2011-04-17 LAB — HEPATIC FUNCTION PANEL
ALT: 12 U/L (ref 0–35)
Alkaline Phosphatase: 40 U/L (ref 39–117)
Indirect Bilirubin: 0.3 mg/dL (ref 0.3–0.9)
Total Bilirubin: 0.4 mg/dL (ref 0.3–1.2)

## 2011-04-17 LAB — HEPATITIS PANEL, ACUTE
Hep A IgM: NEGATIVE
Hep B C IgM: NEGATIVE
Hepatitis B Surface Ag: NEGATIVE

## 2011-04-17 LAB — HIV ANTIBODY (ROUTINE TESTING W REFLEX): HIV: NONREACTIVE

## 2011-04-17 LAB — ANTI-NEUTROPHIL ANTIBODY

## 2011-04-20 LAB — CBC
HCT: 41.3 % (ref 36.0–46.0)
MCV: 91.4 fL (ref 78.0–100.0)
RBC: 4.52 MIL/uL (ref 3.87–5.11)
WBC: 7.6 10*3/uL (ref 4.0–10.5)

## 2011-04-20 LAB — AFB CULTURE WITH SMEAR (NOT AT ARMC): Acid Fast Smear: NONE SEEN

## 2011-04-20 LAB — TISSUE CULTURE: Culture: NO GROWTH

## 2011-04-20 LAB — FUNGUS CULTURE W SMEAR: Fungal Smear: NONE SEEN

## 2011-04-20 LAB — COMPREHENSIVE METABOLIC PANEL
AST: 21 U/L (ref 0–37)
BUN: 10 mg/dL (ref 6–23)
CO2: 25 mEq/L (ref 19–32)
Chloride: 104 mEq/L (ref 96–112)
Creatinine, Ser: 0.75 mg/dL (ref 0.4–1.2)
GFR calc Af Amer: >60 mL/min (ref >60)
GFR calc non Af Amer: >60 mL/min (ref >60)
Total Bilirubin: 0.6 mg/dL (ref 0.3–1.2)

## 2011-04-20 LAB — TYPE AND SCREEN

## 2011-04-20 LAB — APTT: aPTT: 28 seconds (ref 24–37)

## 2011-05-21 ENCOUNTER — Other Ambulatory Visit: Payer: Self-pay

## 2012-01-15 ENCOUNTER — Ambulatory Visit (INDEPENDENT_AMBULATORY_CARE_PROVIDER_SITE_OTHER): Payer: Self-pay | Admitting: Family Medicine

## 2012-01-15 ENCOUNTER — Encounter: Payer: Self-pay | Admitting: Family Medicine

## 2012-01-15 VITALS — BP 146/82 | HR 85 | Ht 65.0 in | Wt 116.0 lb

## 2012-01-15 DIAGNOSIS — R634 Abnormal weight loss: Secondary | ICD-10-CM

## 2012-01-15 DIAGNOSIS — R51 Headache: Secondary | ICD-10-CM

## 2012-01-15 DIAGNOSIS — R030 Elevated blood-pressure reading, without diagnosis of hypertension: Secondary | ICD-10-CM

## 2012-01-15 DIAGNOSIS — J984 Other disorders of lung: Secondary | ICD-10-CM

## 2012-01-15 DIAGNOSIS — E349 Endocrine disorder, unspecified: Secondary | ICD-10-CM

## 2012-01-15 LAB — CBC WITH DIFFERENTIAL/PLATELET
Eosinophils Relative: 1 % (ref 0–5)
HCT: 37.4 % (ref 36.0–46.0)
Hemoglobin: 13.1 g/dL (ref 12.0–15.0)
Lymphocytes Relative: 25 % (ref 12–46)
Lymphs Abs: 2.4 10*3/uL (ref 0.7–4.0)
MCV: 84.4 fL (ref 78.0–100.0)
Monocytes Absolute: 0.6 10*3/uL (ref 0.1–1.0)
RBC: 4.43 MIL/uL (ref 3.87–5.11)
WBC: 9.4 10*3/uL (ref 4.0–10.5)

## 2012-01-15 LAB — COMPREHENSIVE METABOLIC PANEL
ALT: 15 U/L (ref 0–35)
Albumin: 4.3 g/dL (ref 3.5–5.2)
BUN: 7 mg/dL (ref 6–23)
CO2: 28 mEq/L (ref 19–32)
Calcium: 9.2 mg/dL (ref 8.4–10.5)
Chloride: 107 mEq/L (ref 96–112)
Creat: 0.82 mg/dL (ref 0.50–1.10)

## 2012-01-15 LAB — TSH: TSH: 1.249 u[IU]/mL (ref 0.350–4.500)

## 2012-01-15 NOTE — Progress Notes (Signed)
  Subjective:    Patient ID: [name], female    DOB: Apr 02, 1976, 36 y.o.   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   She presents to the office today with approximately 30 complaints that she has typed out on a sheet of paper. This is a list of all symptoms that the patient has had in the last year since she last saw a physician. These complaints are very generalized and include almost every system of the body. Specifically, her current symptoms include pressure in her head, throat, and lower extremities that makes her feel like she is going to "pass out." Additionally, she has transient chest pain, blurred vision, heat intolerance, cold intolerance, and petechiae on her left shin. She thinks that this is all part of a systemic endocrine disease, and states that she has a problem with both her pituitary gland and thyroid - although she has never been told exactly what the problem is. She claims to have been in the process of an extensive endocrine work-up by Dr. Margaretmary Bayley in 2012, after she was referred to him by her previous PCP Helane Rima, DO. Her endocrine work-up was halted in July 2012, because she could not afford labs test or follow-up visits. She presents today to re-initiate a work up that she hopes will clarify her exact endocrine disease or cause of her myriad symptoms.   Review of Systems Negative unless stated in the HPI.      Objective:   Physical Exam  Constitutional: She appears well-developed and well-nourished.  Non-toxic appearance. She does not appear ill. She appears distressed.  HENT:  Head: Normocephalic and atraumatic.  Right Ear: External ear normal.  Left Ear: External ear normal.  Eyes: Conjunctivae and EOM are normal. Pupils are equal, round, and  reactive to light.  Neck: Normal range of motion. Neck supple. No thyromegaly present.  Pulmonary/Chest: Effort normal and breath sounds normal.  Abdominal: Soft. Bowel sounds are normal.  Lymphadenopathy:    She has no cervical adenopathy.  Skin: Petechiae and rash noted. She is not diaphoretic. No pallor.          Well healed scar on left posterior scapular/latissimus dorsi   Psychiatric: She has a normal mood and affect.   BP 146/82  Pulse 85  Ht 5\' 5"  (1.651 m)  Wt 116 lb (52.617 kg)  BMI 19.30 kg/m2        Assessment & Plan:  [name] is a 36 year old female who presents with many generalized symptoms and concern for an endocrine disease, but fortunately is very well appearing on exam.

## 2012-01-15 NOTE — Patient Instructions (Addendum)
Dear [name],   Thank you for coming to clinic today. Please read below regarding the issues that we discussed.   Endocrine Problems: I realize that you are in the middle of having this worked up with another physician. We will start this process again, but not quite to the same degree as the endocrinologist. We will start with a few screening labs and go from there. I will send you a letter with the results, and we can discuss them at your next visit.   Please follow up in clinic in 2-4 weeks. Please call earlier if you have any questions or concerns.   Sincerely,   Dr. Clinton Sawyer

## 2012-01-16 ENCOUNTER — Encounter: Payer: Self-pay | Admitting: Family Medicine

## 2012-01-16 NOTE — Assessment & Plan Note (Signed)
Patient's weight appears to have been very stable over the last year. Therefore,  I will move this problem to her medical history.   Wt Readings from Last 5 Encounters:  01/15/12 116 lb (52.617 kg)  03/14/11 116 lb 8 oz (52.844 kg)  01/18/11 112 lb 12.8 oz (51.166 kg)  01/15/11 114 lb (51.71 kg)  12/04/10 112 lb 6.4 oz (50.984 kg)

## 2012-01-22 ENCOUNTER — Encounter: Payer: Self-pay | Admitting: Family Medicine

## 2012-01-25 ENCOUNTER — Encounter: Payer: Self-pay | Admitting: Family Medicine

## 2012-01-25 DIAGNOSIS — R030 Elevated blood-pressure reading, without diagnosis of hypertension: Secondary | ICD-10-CM

## 2012-01-25 DIAGNOSIS — R51 Headache: Secondary | ICD-10-CM | POA: Insufficient documentation

## 2012-01-25 DIAGNOSIS — E349 Endocrine disorder, unspecified: Secondary | ICD-10-CM | POA: Insufficient documentation

## 2012-01-25 DIAGNOSIS — R519 Headache, unspecified: Secondary | ICD-10-CM | POA: Insufficient documentation

## 2012-01-25 HISTORY — DX: Headache: R51

## 2012-01-25 HISTORY — DX: Elevated blood-pressure reading, without diagnosis of hypertension: R03.0

## 2012-01-25 NOTE — Assessment & Plan Note (Signed)
BP Readings from Last 3 Encounters:  01/15/12 146/82  03/14/11 129/92  01/18/11 112/70   Possibly related to headache and stress; Recheck at f/u visit

## 2012-01-25 NOTE — Assessment & Plan Note (Addendum)
Despite abnormal lab value findings in the past, she appears clinically well on exam. She has clearly has survived for a year without any serious problems since she has not seen a physician or been to the ED. I called the office of Dr. Margaretmary Bayley, endocrinologist to seek advice and was told that Dr. Chestine Spore only met her once and ordered lab tests that were never completed. Therefore, I will order a CBC, CMET, and TSH for screening labs and reassess in 4 weeks.

## 2012-01-25 NOTE — Assessment & Plan Note (Signed)
This is just one of many symptoms that the patient presented with today. There was no evidence on physical exam of a secondary cause, so I will treat it as a primary headache. It may be related to HTN as her BP was slightly elevated today. I will not treat her for HTN at this time, because she was anxious in the office today. However, this will be followed up on. For now, she can use NSAIDS for tension type headache.

## 2012-02-13 ENCOUNTER — Ambulatory Visit (INDEPENDENT_AMBULATORY_CARE_PROVIDER_SITE_OTHER): Payer: Self-pay | Admitting: Family Medicine

## 2012-02-13 ENCOUNTER — Encounter: Payer: Self-pay | Admitting: Family Medicine

## 2012-02-13 VITALS — BP 127/83 | HR 102 | Ht 65.0 in | Wt 115.0 lb

## 2012-02-13 DIAGNOSIS — E349 Endocrine disorder, unspecified: Secondary | ICD-10-CM

## 2012-02-13 DIAGNOSIS — R350 Frequency of micturition: Secondary | ICD-10-CM

## 2012-02-13 DIAGNOSIS — M549 Dorsalgia, unspecified: Secondary | ICD-10-CM

## 2012-02-13 HISTORY — DX: Dorsalgia, unspecified: M54.9

## 2012-02-13 LAB — POCT UA - MICROSCOPIC ONLY

## 2012-02-13 LAB — POCT URINALYSIS DIPSTICK
Protein, UA: NEGATIVE
Spec Grav, UA: 1.01
Urobilinogen, UA: 0.2
pH, UA: 6.5

## 2012-02-13 NOTE — Assessment & Plan Note (Addendum)
We discussed the results of her exam and lab testing completely normal. I told her there I have no evidence of that she has an endocrine disorder. However I am happy to refer her to another endocrinologist. She seemed is Frustrated that I was not able to find any abnormalities, and did not seem happy that I told her that she was very well appearing. It was as if she wanted something to be wrong. After I made the offer to refer her to another endocrinologist, she said "I think that I'm going to start new with everyone." I understood this to mean that she no longer wanted to see me as her primary care physician. I told her that we would be here to help if needed.

## 2012-02-13 NOTE — Progress Notes (Signed)
  Subjective:    Patient ID: [name], female    DOB: Nov 17, 1975, 36 y.o.   MRN: [id number]  HPI  36 year old female who presents with multiple complaints. Primarily she is concerned about bilateral lower back pain. She says it is been worsening over the last few days. She denies that it feels like a muscle spasm. She denies any falls or trauma. She denies any history of similar back pain. This is associated with worsening polyuria. She denies dysuria or history of urinary tract infections. She denies fever, nausea, vomiting, and abdominal pain.   Review of Systems  Constitutional: Positive for appetite change.  Eyes: Negative.   Respiratory: Negative.   Cardiovascular: Negative.   Gastrointestinal: Negative.   Genitourinary: Positive for frequency. Negative for hematuria.  Musculoskeletal: Positive for back pain.  Neurological: Negative.   Hematological: Bruises/bleeds easily.       Objective:   Physical Exam  Constitutional: She appears well-developed and well-nourished. No distress.  Cardiovascular: Normal rate and regular rhythm.   Pulmonary/Chest: Effort normal and breath sounds normal.  Abdominal: Soft. Normal appearance and bowel sounds are normal. There is no splenomegaly or hepatomegaly. There is no tenderness. There is no CVA tenderness.  Skin: Rash noted. Rash is papular. She is not diaphoretic.       Red papular rash on right shin.    Urinalysis    Component Value Date/Time   COLORURINE YELLOW 02/05/2010 2112   APPEARANCEUR CLOUDY* 02/05/2010 2112   LABSPEC 1.012 02/05/2010 2112   PHURINE 5.5 02/05/2010 2112   GLUCOSEU NEGATIVE 02/05/2010 2112   HGBUR NEGATIVE 02/05/2010 2112   BILIRUBINUR NEG 02/13/2012 1618   BILIRUBINUR NEGATIVE 02/05/2010 2112   KETONESUR NEGATIVE 02/05/2010 2112   PROTEINUR NEGATIVE 02/05/2010 2112   UROBILINOGEN 0.2 02/13/2012 1618   UROBILINOGEN 0.2 02/05/2010 2112   NITRITE NEG 02/13/2012 1618   NITRITE NEGATIVE 02/05/2010 2112   LEUKOCYTESUR  Trace 02/13/2012 1618        Assessment & Plan:

## 2012-02-13 NOTE — Assessment & Plan Note (Signed)
Despite symptoms consistent with urinary tract infection, the patient had a completely normal urine. I suspect the symptoms she's feeling may be related to a pain Disorder or somatizations. She seems to have vague complaints and all systems, and makes it very difficult to focus on what may be the real cause. I told her that she feels like a muscle spasm, I'm happy to prescribe Flexeril. However she denied it was a muscle spasm and seemed very frustrated by my suggestion.

## 2012-05-12 ENCOUNTER — Encounter: Payer: Self-pay | Admitting: Obstetrics & Gynecology

## 2012-05-12 ENCOUNTER — Ambulatory Visit (INDEPENDENT_AMBULATORY_CARE_PROVIDER_SITE_OTHER): Payer: Self-pay | Admitting: Obstetrics & Gynecology

## 2012-05-12 VITALS — BP 118/79 | HR 83 | Temp 98.1°F | Ht 65.0 in | Wt 115.4 lb

## 2012-05-12 DIAGNOSIS — N898 Other specified noninflammatory disorders of vagina: Secondary | ICD-10-CM

## 2012-05-12 DIAGNOSIS — N938 Other specified abnormal uterine and vaginal bleeding: Secondary | ICD-10-CM

## 2012-05-12 DIAGNOSIS — N949 Unspecified condition associated with female genital organs and menstrual cycle: Secondary | ICD-10-CM

## 2012-05-12 LAB — CBC
HCT: 36 % (ref 36.0–46.0)
Hemoglobin: 12.3 g/dL (ref 12.0–15.0)
MCV: 84.5 fL (ref 78.0–100.0)
RDW: 13.8 % (ref 11.5–15.5)
WBC: 7.4 10*3/uL (ref 4.0–10.5)

## 2012-05-12 MED ORDER — METRONIDAZOLE 500 MG PO TABS: 500.0000 mg | ORAL_TABLET | Freq: Two times a day (BID) | ORAL | Status: DC

## 2012-05-12 NOTE — Progress Notes (Signed)
  Subjective:    Patient ID: [name], female    DOB: [birth year], 36 y.o.   MRN: [id number]  HPI  She has been having periods that last 14 days and then only 1 week of non-bleeding between periods. This started in July, the same time that she started re-dating her ex-husband.  A TSH was normal recently.   Review of Systems     Objective:   Physical Exam  Frothy cervical discharge NSSA, NT, mobile Normal adnexa      Assessment & Plan:   DUB- check cervical cultures, CBC, pelvic u/s Frothy discharge- treat with flagyl

## 2012-05-13 LAB — WET PREP, GENITAL
Trich, Wet Prep: NONE SEEN
Yeast Wet Prep HPF POC: NONE SEEN

## 2012-05-28 ENCOUNTER — Telehealth: Payer: Self-pay | Admitting: *Deleted

## 2012-05-28 NOTE — Telephone Encounter (Signed)
Called pt to discuss the need for pelvic US as ordered by Dr. Marice Potter. This needs to be done prior to her next clinic visit on 11/27.  Pt states that she is very busy with work, school and care of her children. She will need to check with her boss to see when she can be off of work for the exam.  Pt asked if it was a necessary test because she does not have insurance. Her understanding from last visit was that Dr. Marice Potter would order the [place] if the pt wanted. Pt now states that she is fine without it unless Dr. Marice Potter really feels it is necessary. I told pt that I will check with Dr. Marice Potter and call her back. Pt voiced understanding.

## 2012-05-29 NOTE — Telephone Encounter (Signed)
After speaking with Dr. Marice Potter, I called pt and informed her that the pelvic US is not imperative and it will be fine for her not to have it. Pt will keep appt on 11/27 for Endo Bx as scheduled. She voiced understanding and had no additional questions.

## 2012-06-10 ENCOUNTER — Encounter: Payer: Self-pay | Admitting: Obstetrics & Gynecology

## 2012-06-11 ENCOUNTER — Other Ambulatory Visit: Payer: Self-pay | Admitting: Obstetrics & Gynecology

## 2012-06-17 ENCOUNTER — Encounter: Payer: Self-pay | Admitting: Home Health Services

## 2012-07-02 ENCOUNTER — Other Ambulatory Visit: Payer: Self-pay | Admitting: Obstetrics & Gynecology

## 2012-08-30 ENCOUNTER — Other Ambulatory Visit: Payer: Self-pay

## 2012-10-13 ENCOUNTER — Encounter: Payer: Self-pay | Admitting: *Deleted

## 2012-10-13 ENCOUNTER — Ambulatory Visit (INDEPENDENT_AMBULATORY_CARE_PROVIDER_SITE_OTHER): Payer: Self-pay | Admitting: Obstetrics & Gynecology

## 2012-10-13 ENCOUNTER — Other Ambulatory Visit (HOSPITAL_COMMUNITY)
Admission: RE | Admit: 2012-10-13 | Discharge: 2012-10-13 | Disposition: A | Discharge status: Home / Self Care | Payer: Self-pay | Source: Ambulatory Visit | Attending: Obstetrics & Gynecology | Admitting: Obstetrics & Gynecology

## 2012-10-13 VITALS — BP 132/82 | HR 95 | Temp 97.9°F

## 2012-10-13 DIAGNOSIS — N938 Other specified abnormal uterine and vaginal bleeding: Secondary | ICD-10-CM

## 2012-10-13 DIAGNOSIS — N949 Unspecified condition associated with female genital organs and menstrual cycle: Secondary | ICD-10-CM

## 2012-10-13 LAB — CBC
MCH: 28.7 pg (ref 26.0–34.0)
MCHC: 33.9 g/dL (ref 30.0–36.0)
MCV: 84.7 fL (ref 78.0–100.0)
Platelets: 186 10*3/uL (ref 150–400)
RBC: 4.39 MIL/uL (ref 3.87–5.11)
RDW: 14.2 % (ref 11.5–15.5)

## 2012-10-13 NOTE — Progress Notes (Signed)
  Subjective:    Patient ID: [name], female    DOB: 02-15-1976, 37 y.o.   MRN: [id number]  HPI  37 yo lady with a h/o DUB, seen in 10/13. Her TSH and CBC were normal. She did not get the u/s yet due to finances. She is here for an EMBX. She gives a h/o a 2 mm pituitary gland. She previously saw the endocrinologist Dr. Chestine Spore but has not followed up with him.  Review of Systems Her pap 2012 was normal with negative HR HPV    Objective:   Physical Exam   UPT negative, consent signed, time out done Cervix prepped with betadine and grasped with a single tooth tenaculum Uterus sounded to 9 cm Pipelle used for 2 passes with a moderate amount of tissue obtained. She tolerated the procedure well.       Assessment & Plan:  DUB- await Embx. I still recommend a gyn u/s. I will repeat some blood work since she has anemia.

## 2012-10-14 LAB — FOLLICLE STIMULATING HORMONE: FSH: 3.7 m[IU]/mL

## 2012-10-15 ENCOUNTER — Other Ambulatory Visit (HOSPITAL_COMMUNITY): Payer: Self-pay

## 2012-10-15 ENCOUNTER — Ambulatory Visit (HOSPITAL_COMMUNITY)
Admission: RE | Admit: 2012-10-15 | Discharge: 2012-10-15 | Disposition: A | Discharge status: Home / Self Care | Payer: Self-pay | Source: Ambulatory Visit | Attending: Obstetrics & Gynecology | Admitting: Obstetrics & Gynecology

## 2012-10-15 DIAGNOSIS — N8000 Endometriosis of the uterus, unspecified: Secondary | ICD-10-CM | POA: Insufficient documentation

## 2012-10-15 DIAGNOSIS — N938 Other specified abnormal uterine and vaginal bleeding: Secondary | ICD-10-CM

## 2012-10-15 DIAGNOSIS — N920 Excessive and frequent menstruation with regular cycle: Secondary | ICD-10-CM | POA: Insufficient documentation

## 2012-10-21 ENCOUNTER — Telehealth: Payer: Self-pay | Admitting: *Deleted

## 2012-10-21 NOTE — Telephone Encounter (Signed)
Pt left message requesting results of Endo Bx.

## 2012-10-22 ENCOUNTER — Encounter: Payer: Self-pay | Admitting: Obstetrics & Gynecology

## 2012-10-22 NOTE — Telephone Encounter (Signed)
Spoke to patient and gave result as requested. Patient requested follow up appt to discuss further management of DUB; appt made at front desk with heather.  patient agrees and satisfied.

## 2012-10-27 ENCOUNTER — Encounter: Payer: Self-pay | Admitting: Obstetrics & Gynecology

## 2012-11-19 ENCOUNTER — Encounter: Payer: Self-pay | Admitting: Obstetrics & Gynecology

## 2012-11-19 ENCOUNTER — Ambulatory Visit (INDEPENDENT_AMBULATORY_CARE_PROVIDER_SITE_OTHER): Payer: Self-pay | Admitting: Obstetrics & Gynecology

## 2012-11-19 VITALS — BP 135/82 | HR 83 | Temp 98.3°F | Ht 65.0 in | Wt 113.6 lb

## 2012-11-19 DIAGNOSIS — N938 Other specified abnormal uterine and vaginal bleeding: Secondary | ICD-10-CM

## 2012-11-19 DIAGNOSIS — N949 Unspecified condition associated with female genital organs and menstrual cycle: Secondary | ICD-10-CM

## 2012-11-19 MED ORDER — NORETHINDRONE 0.35 MG PO TABS: 1.0000 | ORAL_TABLET | Freq: Every day | ORAL | Status: DC

## 2012-11-19 NOTE — Addendum Note (Signed)
Addended by: Allie Bossier on: 11/19/2012 03:42 PM   Modules accepted: Orders

## 2012-11-19 NOTE — Progress Notes (Signed)
  Subjective:    Patient ID: [name], female    DOB: [birth year], 37 y.o.   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 Her w/u has been negative except possible endometriosis.  Today she would like for me to look at a "lump" in her left groin. It has been there for at least a month. It is some bit tender.   Review of Systems     Objective:   Physical Exam   Folliculitis on shaved mons.     Assessment & Plan:  DUB-I have discussed treatment options including POPs (smoker), Mirena, Novasure endometrial ablation. She opts for POPs today. She is trying to quit smoking. I have recommended trimming with scissors instead of shaving.

## 2013-02-18 ENCOUNTER — Encounter: Payer: Self-pay | Admitting: Infectious Disease

## 2013-02-18 ENCOUNTER — Ambulatory Visit (INDEPENDENT_AMBULATORY_CARE_PROVIDER_SITE_OTHER): Payer: Self-pay | Admitting: Infectious Disease

## 2013-02-18 VITALS — BP 125/84 | HR 78 | Temp 99.0°F | Wt 116.0 lb

## 2013-02-18 DIAGNOSIS — A150 Tuberculosis of lung: Secondary | ICD-10-CM

## 2013-02-18 DIAGNOSIS — Z227 Latent tuberculosis: Secondary | ICD-10-CM

## 2013-02-18 DIAGNOSIS — R911 Solitary pulmonary nodule: Secondary | ICD-10-CM

## 2013-02-18 LAB — COMPLETE METABOLIC PANEL WITH GFR
ALT: 11 U/L (ref 0–35)
AST: 16 U/L (ref 0–37)
Albumin: 4.2 g/dL (ref 3.5–5.2)
Alkaline Phosphatase: 46 U/L (ref 39–117)
BUN: 8 mg/dL (ref 6–23)
CO2: 29 mEq/L (ref 19–32)
Calcium: 9.1 mg/dL (ref 8.4–10.5)
Chloride: 102 mEq/L (ref 96–112)
Creat: 0.69 mg/dL (ref 0.50–1.10)
GFR, Est African American: >89 mL/min
GFR, Est Non African American: >89 mL/min
Glucose, Bld: 77 mg/dL (ref 70–99)
Potassium: 4.1 mEq/L (ref 3.5–5.3)
Sodium: 137 mEq/L (ref 135–145)
Total Bilirubin: 0.3 mg/dL (ref 0.3–1.2)
Total Protein: 6.6 g/dL (ref 6.0–8.3)

## 2013-02-18 MED ORDER — VITAMIN B-6 50 MG PO TABS: 50.0000 mg | ORAL_TABLET | Freq: Every day | ORAL | Status: DC

## 2013-02-18 MED ORDER — ISONIAZID 300 MG PO TABS: 300.0000 mg | ORAL_TABLET | Freq: Every day | ORAL | Status: DC

## 2013-02-18 NOTE — Progress Notes (Signed)
Subjective:    Patient ID: [name], female    DOB: 1975-09-29, 37 y.o.   MRN: [id number]  HPI   40 year old lady whom I met at [facility] on the consult in November 2009. At the time she had a several month history of night sweats, weight loss of over 16 pounds and unexplained leukocytosis to 18K . She had GI workup with colonoscopy and biopsies, EGD done by Dr. Alphonzo Lemmings, serologies, all of which were unrevealing. 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 While at [facility] she was ruled out for TB with serial sputa and bronchoscopy by Dr. Vassie Loll that revealed a white colored endobronchial lesion. Her cultures from BAL, sputa, biopsy have failed to grow fungi or TB, path was nondiagnsostic. HEr WBC had come down while on antibiotics in the hospital day one Urine histo ag, serum crypto ags, were negative, ANA and ANCA were negative. 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 In the interim she was discovered to have an abnormal lesion on PET scan and was taken to the operating room at [facility]. Apparently gross pus was encountered she underwent a partial pneumonectomy. She had been seen by Dr. Imogene Burn at [facility] prior to surgery. She states that she was not placed on antibiotics after surgery. I do not have records however of that surgery or the path report or any microbiological studies. This took place in 2010. In any case she felt quite well approximately 2 weeks after having the pneumonectomy and was feeling back to normal until last spring around February 2011 when she again began complaining of weight loss and drenching night sweats. She established care with family medicine here in [place]. And underwent a workup. She was found to have abnormal Lehigh beta hCG was initially thought to possibly be pregnant but  subsequent testing revealed a stenotic case. She has had workup done by Dr. Chestine Spore with endocrinology. In addition to the workup done by her primary care physicians in family medicine. 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 She states she's also been found a thyroid nodule but the biopsies been negative and her thyroid function tests were normal with the exception of her T3 which was slightly elevated. Since then she has undergone further workup by oncology including CT scans chest abdomen and pelvis. CT scan showed  abnormality along the site of her prior surgery her lungs. As well as one lesion within her liver. PET scan showed hypermetabolic areas along the area of her surgery in her lungs. She has since undergone CT-guided biopsy of this lesion. 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 AFB and fungal stains and cultures have been negative to date bacterial culture was negative. She is actually feeling better has gained 10 pounds back from a 20 pound that she lost since last February. She did when I last saw her continue to however to have night sweats and does occasionally have low-grade temperatures.  She saw [name] again and he was highly skeptical of any significant ILD. I had sent off fungal serologies which were negative as well.   I last saw her 2 years ago and referred her to [facility] for rx of her LTB but  she had trouble with interface with [facility] (she claims they insisted on repeat IGRA etc). She returns with desire to complete rx for LTB so that if she needs immunosuppressive therapy she can receive it   Review of Systems  Constitutional: Negative for fever, chills, diaphoresis, activity change, appetite change, fatigue and unexpected weight change.  HENT: Negative for congestion, sore throat, rhinorrhea, sneezing, trouble swallowing and sinus pressure.   Eyes: Negative for photophobia and  visual disturbance.  Respiratory: Negative for cough, chest tightness, shortness of breath, wheezing and stridor.   Cardiovascular: Negative for chest pain, palpitations and leg swelling.  Gastrointestinal: Negative for nausea, vomiting, abdominal pain, diarrhea, constipation, blood in stool, abdominal distention and anal bleeding.  Genitourinary: Negative for dysuria, hematuria, flank pain and difficulty urinating.  Musculoskeletal: Negative for myalgias, back pain, joint swelling, arthralgias and gait problem.  Skin: Negative for color change, pallor, rash and wound.  Neurological: Negative for dizziness, tremors, weakness and light-headedness.  Hematological: Negative for adenopathy. Does not bruise/bleed easily.  Psychiatric/Behavioral: Negative for behavioral problems, confusion, sleep disturbance, dysphoric mood, decreased concentration and agitation.       Objective:   Physical Exam  Constitutional: She is oriented to person, place, and time. She appears well-developed and well-nourished. No distress.  HENT:  Head: Normocephalic and atraumatic.  Mouth/Throat: Oropharynx is clear and moist. No oropharyngeal exudate.  Eyes: Conjunctivae and EOM are normal. Pupils are equal, round, and reactive to light. No scleral icterus.  Neck: Normal range of motion. Neck supple. No JVD present.  Cardiovascular: Normal rate, regular rhythm and normal heart sounds.  Exam reveals no gallop and no friction rub.   No murmur heard. Pulmonary/Chest: Effort normal and breath sounds normal. No respiratory distress. She has no wheezes. She has no rales. She exhibits no tenderness.  Abdominal: She exhibits no distension and no mass. There is no tenderness. There is no rebound and no guarding.  Musculoskeletal: She exhibits no edema and no tenderness.  Lymphadenopathy:    She has no cervical adenopathy.  Neurological: She is alert and oriented to person, place, and time. She has normal reflexes. She exhibits  normal muscle tone. Coordination normal.  Skin: Skin is warm and dry. She is not diaphoretic. No erythema. No pallor.  Psychiatric: She has a normal mood and affect. Her behavior is normal. Judgment and thought content normal.          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

## 2013-02-19 ENCOUNTER — Telehealth: Payer: Self-pay | Admitting: Pulmonary Disease

## 2013-02-19 NOTE — Telephone Encounter (Signed)
I spoke with pt. She did have scans this past weekend at [facility]. This is able to be pulled in care everywhere. Please advise RA thanks

## 2013-02-19 NOTE — Telephone Encounter (Signed)
I received note from dr Zenaida Niece dam about her starting TB prophylaxis. Has she gotten any more scans since 2012 ? If not, she needs FU with me after CT with contrast

## 2013-02-21 ENCOUNTER — Telehealth: Payer: Self-pay | Admitting: Pulmonary Disease

## 2013-02-21 NOTE — Telephone Encounter (Signed)
CT report from [facility] 02/14/13 >> unchanged LUL 18 mm nodule from 10/20/2008 Pl obtain images to review

## 2013-02-23 NOTE — Telephone Encounter (Signed)
I called and spoke with [facility] in [place]. They will place images on disc and mail over to our office. Nothing further needed

## 2013-03-05 ENCOUNTER — Telehealth: Payer: Self-pay | Admitting: Licensed Clinical Social Worker

## 2013-03-05 NOTE — Telephone Encounter (Signed)
She can have some megace 635mg  daily, or if there is more a nausea problem then zofran 4mg  qid prn

## 2013-03-05 NOTE — Telephone Encounter (Signed)
Patient states since starting the TB medication she has no appetite and would like an RX for something to stimulate her appetite.

## 2013-03-06 ENCOUNTER — Other Ambulatory Visit: Payer: Self-pay | Admitting: Licensed Clinical Social Worker

## 2013-03-06 ENCOUNTER — Telehealth: Payer: Self-pay | Admitting: *Deleted

## 2013-03-06 DIAGNOSIS — R630 Anorexia: Secondary | ICD-10-CM

## 2013-03-06 MED ORDER — MEGESTROL ACETATE 625 MG/5ML PO SUSP: 625.0000 mg | Freq: Every day | ORAL | Status: DC

## 2013-03-06 NOTE — Telephone Encounter (Signed)
Thanks Tamika. 

## 2013-03-06 NOTE — Telephone Encounter (Signed)
Unfortunately, There is nothing cheaper or legal that I can prescribe her to help her with her appetite

## 2013-03-06 NOTE — Telephone Encounter (Signed)
Patient called and advised that she can not afford the Megace that Dr Daiva Eves prescribed for her, she is uninsured and it cost $1000. She was wondering if he could call something else in instead. Advised her without insurance the Rx for any appetite stimulant will be costly but that I will ask Dr Daiva Eves if there is anything else and get back to her. She advised a couple hundred dollars is fine but $1000 is just to much.

## 2013-03-06 NOTE — Telephone Encounter (Signed)
She said no to nausea, just lack of appetite I will call that in

## 2013-03-13 NOTE — Telephone Encounter (Signed)
Reviewed CT angio from [facility]- shows nodule LUL/ suprahilar Needs FU Ov to discuss

## 2013-03-13 NOTE — Telephone Encounter (Signed)
Pt appt scheduled nothing further needed

## 2013-03-31 ENCOUNTER — Encounter: Payer: Self-pay | Admitting: Pulmonary Disease

## 2013-03-31 ENCOUNTER — Ambulatory Visit (INDEPENDENT_AMBULATORY_CARE_PROVIDER_SITE_OTHER): Payer: Self-pay | Admitting: Pulmonary Disease

## 2013-03-31 VITALS — BP 122/74 | HR 95 | Ht 65.0 in | Wt 124.5 lb

## 2013-03-31 DIAGNOSIS — J984 Other disorders of lung: Secondary | ICD-10-CM

## 2013-03-31 DIAGNOSIS — Z7189 Other specified counseling: Secondary | ICD-10-CM

## 2013-03-31 DIAGNOSIS — Z716 Tobacco abuse counseling: Secondary | ICD-10-CM

## 2013-03-31 DIAGNOSIS — F17200 Nicotine dependence, unspecified, uncomplicated: Secondary | ICD-10-CM

## 2013-03-31 MED ORDER — VARENICLINE TARTRATE 0.5 MG X 11 & 1 MG X 42 PO MISC: ORAL | Status: DC

## 2013-03-31 MED ORDER — VARENICLINE TARTRATE 1 MG PO TABS: 1.0000 mg | ORAL_TABLET | Freq: Two times a day (BID) | ORAL | Status: DC

## 2013-03-31 NOTE — Patient Instructions (Addendum)
You have to quit smoking Rx for chantix sent - Try nicotine patch first CT chest in 1 year & FU after You can establish with Dr Abner Greenspan or Peggyann Juba next door

## 2013-03-31 NOTE — Assessment & Plan Note (Signed)
Smoking cesation emphasized Rx for chantix given

## 2013-03-31 NOTE — Progress Notes (Signed)
  Subjective:    Patient ID: [name], female    DOB: Oct 11, 1975, 37 y.o.   MRN: [id number]  HPI  32/ F for FU of Left upper lobe nodule.  She presented in January 2009 with night sweats and decreased appetite and 16 pounds wt loss from 123 in January to about 107 in 11/09. She noted a change in her bowel habits and some bloating of her stomach. An ultrasound of the abdomen and pelvis showed a simple cyst in her left ovary. Blood counts showed mild leukocytosis and TSH was normal. Dr. Alphonzo Lemmings performed an upper and lower endoscopy due to diarrhea and this was nondiagnostic. Apparently, she also had normal thyroid celiac testing and autoimmune testing. She had an excision biopsy of a mole on her back that turned outt o be malignant melanoma, clark III & was re-excised. A CT of the abdomen did not show any significant lymphadenopathy. A PPD test was placed and was marginally positive at 10 mm .  CT chest >> central left upper lobe opacity with 1.1 x 0.9 cm AP window LN. No hilar lymphadenopathy was noticed. MRI of the brain and spinal cord was normal. Two subcentimeter right thyroid lobe nodules were noted which were too small for histologic sampling.  ANA, RA ,Urine histo Ag neg, ANCA neg. ACE level was low  Bronchoscopy 11/09>>In the left upper lobe, a whitish endobronchial lesion was visualized in the anterior segment and one of the subsegments of the anterior segment.  Nondiagnostic biopsy, afb cx neg  Rpt CT 12/09>> Significant improvement in the nodular opacity within the anteromedial left upper lobe most consistent with improving inflammatory process. Decrease in mediastinal nodes.  12/09 Bscopy neg, Mediastinoscopy neg  3/10 CT angio >> regressing LUL lesion, no PE  She subsequently underwent VATS wedge resection at [facility].  She was lost to FU & then returned to Oncology ([facility]) -PET scan showed recurernt parenchymal opacities in the LUL, SUV 5.7 - with mildly enalrged left paratracheal Lns ,  not hypermetabolic.  CT guided LN biopsy was performed - neg for malignancy & afb & showed interstitial fibrosis.  Rpt labs show ESR of 1, low CRP & no leucocytosis      03/31/2013  Lost to FU x 2y I received note from dr Zenaida Niece dam about her starting TB prophylaxis. Reviewed CT angio from [facility]- 02/2013  >> unchanged LUL 18 mm nodule from 10/20/2008 She continues to smoke 7-8 cigs/d Has gained 10 lbs back    Review of Systems neg for any significant sore throat, dysphagia, itching, sneezing, nasal congestion or excess/ purulent secretions, fever, chills, sweats, unintended wt loss, pleuritic or exertional cp, hempoptysis, orthopnea pnd or change in chronic leg swelling. Also denies presyncope, palpitations, heartburn, abdominal pain, nausea, vomiting, diarrhea or change in bowel or urinary habits, dysuria,hematuria, rash, arthralgias, visual complaints, headache, numbness weakness or ataxia.     Objective:   Physical Exam  Gen. Pleasant, well-nourished, in no distress ENT - no lesions, no post nasal drip Neck: No JVD, no thyromegaly, no carotid bruits Lungs: no use of accessory muscles, no dullness to percussion, clear without rales or rhonchi  Cardiovascular: Rhythm regular, heart sounds  normal, no murmurs or gallops, no peripheral edema Musculoskeletal: No deformities, no cyanosis or clubbing        Assessment & Plan:

## 2013-03-31 NOTE — Assessment & Plan Note (Signed)
LUL 18 mm nodule - stable on CT  Rpt CT in 1 yr

## 2013-07-21 ENCOUNTER — Ambulatory Visit (INDEPENDENT_AMBULATORY_CARE_PROVIDER_SITE_OTHER): Payer: Self-pay | Admitting: Infectious Disease

## 2013-07-21 ENCOUNTER — Encounter: Payer: Self-pay | Admitting: Infectious Disease

## 2013-07-21 VITALS — BP 156/101 | HR 98 | Temp 98.6°F | Wt 128.0 lb

## 2013-07-21 DIAGNOSIS — R195 Other fecal abnormalities: Secondary | ICD-10-CM

## 2013-07-21 DIAGNOSIS — G44219 Episodic tension-type headache, not intractable: Secondary | ICD-10-CM

## 2013-07-21 DIAGNOSIS — A150 Tuberculosis of lung: Secondary | ICD-10-CM

## 2013-07-21 DIAGNOSIS — Z227 Latent tuberculosis: Secondary | ICD-10-CM

## 2013-07-21 DIAGNOSIS — R1012 Left upper quadrant pain: Secondary | ICD-10-CM

## 2013-07-21 DIAGNOSIS — R232 Flushing: Secondary | ICD-10-CM

## 2013-07-21 DIAGNOSIS — I10 Essential (primary) hypertension: Secondary | ICD-10-CM | POA: Insufficient documentation

## 2013-07-21 DIAGNOSIS — R1011 Right upper quadrant pain: Secondary | ICD-10-CM

## 2013-07-21 DIAGNOSIS — I471 Supraventricular tachycardia: Secondary | ICD-10-CM

## 2013-07-21 HISTORY — DX: Episodic tension-type headache, not intractable: G44.219

## 2013-07-21 HISTORY — DX: Essential (primary) hypertension: I10

## 2013-07-21 LAB — CBC WITH DIFFERENTIAL/PLATELET
BASOS PCT: 1 % (ref 0–1)
Basophils Absolute: 0.1 10*3/uL (ref 0.0–0.1)
Eosinophils Absolute: 0.1 10*3/uL (ref 0.0–0.7)
Eosinophils Relative: 1 % (ref 0–5)
HEMATOCRIT: 40.4 % (ref 36.0–46.0)
HEMOGLOBIN: 13.7 g/dL (ref 12.0–15.0)
LYMPHS PCT: 24 % (ref 12–46)
Lymphs Abs: 2.1 10*3/uL (ref 0.7–4.0)
MCH: 29.7 pg (ref 26.0–34.0)
MCHC: 33.9 g/dL (ref 30.0–36.0)
MCV: 87.6 fL (ref 78.0–100.0)
MONO ABS: 0.5 10*3/uL (ref 0.1–1.0)
MONOS PCT: 6 % (ref 3–12)
NEUTROS ABS: 6.1 10*3/uL (ref 1.7–7.7)
Neutrophils Relative %: 68 % (ref 43–77)
Platelets: 226 10*3/uL (ref 150–400)
RBC: 4.61 MIL/uL (ref 3.87–5.11)
RDW: 13.8 % (ref 11.5–15.5)
WBC: 8.8 10*3/uL (ref 4.0–10.5)

## 2013-07-21 NOTE — Progress Notes (Signed)
Subjective:    Patient ID: [name], female    DOB: [birth year], 38 y.o.   MRN: [id number]  HPI   55 year old lady whom I met at [facility] on the consult in November 2009. At the time she had a several month history of night sweats, weight loss of over 16 pounds and unexplained leukocytosis to 18K . She had GI workup with colonoscopy and biopsies, EGD done by Dr. Georjean Mode, serologies, all of which were unrevealing. When I saw her her PPD had converted to positive (she had history of [place] friend who had LTB as well but no other known contacts) and a LUL cavitary lesion with lymhadenopathy. While at [facility] she was ruled out for TB with serial sputa and bronchoscopy by Dr. Elsworth Soho that revealed a white colored endobronchial lesion. Her cultures from BAL, sputa, biopsy have failed to grow fungi or TB, path was nondiagnsostic. HEr WBC had come down while on antibiotics in the hospital day one Urine histo ag, serum crypto ags, were negative, ANA and ANCA were negative. 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 In the interim she was discovered to have an abnormal lesion on PET scan and was taken to the operating room at [facility]. Apparently gross pus was encountered she underwent a partial pneumonectomy. She had been seen by Dr. Bridgett Larsson at [facility] prior to surgery. She states that she was not placed on antibiotics after surgery. I do not have records however of that surgery or the path report or any microbiological studies. This took place in 2010. In any case she felt quite well approximately 2 weeks after having the pneumonectomy and was feeling back to normal until last spring around February 2011 when she again began complaining of weight loss and drenching night sweats. She established care with family medicine here in [place]. And underwent a workup. She was found to have abnormal [place] beta hCG was initially thought to possibly be pregnant but  subsequent testing revealed a stenotic case. She has had workup done by Dr. Carlis Abbott with endocrinology. In addition to the workup done by her primary care physicians in family medicine. 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 She states she's also been found a thyroid nodule but the biopsies been negative and her thyroid function tests were normal with the exception of her T3 which was slightly elevated. Since then she has undergone further workup by oncology including CT scans chest abdomen and pelvis. CT scan showed  abnormality along the site of her prior surgery her lungs. As well as one lesion within her liver. PET scan showed hypermetabolic areas along the area of her surgery in her lungs. She has since undergone CT-guided biopsy of this lesion. 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 AFB and fungal stains and cultures have been negative to date bacterial culture was negative. She is actually feeling better has gained 10 pounds back from a 20 pound that she lost since last February. She did when I last saw her continue to however to have night sweats and does occasionally have low-grade temperatures.  She saw Kara Mead again and he was highly skeptical of any significant ILD. I had sent off fungal serologies which were negative as well.   I last saw her 2 years ago and referred her to [facility] for rx of her LTB but  she had trouble with interface with [facility] (she claims they insisted on repeat IGRA etc). She returns to see me this fall with with desire to complete rx for LTB so that if she needs immunosuppressive therapy she can receive it.  We initiated rx with INH which she had trouble tolerating and then took megace to help with appetite but then with no menstrual periods, stopped her megace. Still no periods.  She has several NEW complaints today  #1 She has new RUQ and LUQ pain with tenderness at both  sites on exam. She has had loose stools that are more pale colored. Urine is strong smelling but not dark. She has no nausea or vomiting or fevers or weight loss. Pain made better with time and is episodic x 2 weeks.  #2 New headaches that are feeling as a tightness across her head and neck accompanied by high BP. Looking back she states taht she has had similar ha with HTN and also she is having flushing.    Review of Systems  Constitutional: Negative for fever, chills, diaphoresis, activity change, appetite change, fatigue and unexpected weight change.  HENT: Negative for congestion, rhinorrhea, sinus pressure, sneezing, sore throat and trouble swallowing.   Eyes: Negative for photophobia and visual disturbance.  Respiratory: Negative for cough, chest tightness, shortness of breath, wheezing and stridor.   Cardiovascular: Positive for palpitations. Negative for chest pain and leg swelling.  Gastrointestinal: Positive for abdominal pain and diarrhea. Negative for nausea, vomiting, constipation, blood in stool, abdominal distention and anal bleeding.  Genitourinary: Negative for dysuria, hematuria, flank pain and difficulty urinating.  Musculoskeletal: Negative for arthralgias, back pain, gait problem, joint swelling and myalgias.  Skin: Negative for color change, pallor, rash and wound.  Neurological: Positive for headaches. Negative for dizziness, tremors, weakness and light-headedness.  Hematological: Negative for adenopathy. Does not bruise/bleed easily.  Psychiatric/Behavioral: Negative for behavioral problems, confusion, sleep disturbance, dysphoric mood, decreased concentration and agitation.       Objective:   Physical Exam  Constitutional: She is oriented to person, place, and time. She appears well-developed and well-nourished. No distress.  HENT:  Head: Normocephalic and atraumatic.  Mouth/Throat: Oropharynx is clear and moist. No oropharyngeal exudate.  Eyes: Conjunctivae and  EOM are normal. Pupils are equal, round, and reactive to light. No scleral icterus.  Neck: Normal range of motion. Neck supple. No JVD present.  Cardiovascular: Normal rate, regular rhythm and normal heart sounds.  Exam reveals no gallop and no friction rub.   No murmur heard. Pulmonary/Chest: Effort normal and breath sounds normal. No respiratory distress. She has no wheezes. She has no rales. She exhibits no tenderness.  Abdominal: She exhibits no distension and no mass. There is no hepatosplenomegaly. There is tenderness in the right upper quadrant and left upper quadrant. There is no rigidity, no rebound, no guarding, no tenderness at McBurney's point and negative Murphy's sign.  Musculoskeletal: She exhibits no edema and no tenderness.  Lymphadenopathy:    She has no cervical adenopathy.  Neurological: She is alert and oriented to person, place, and time. She has normal reflexes. She exhibits normal muscle tone. Coordination normal.  Skin: Skin is warm and dry. She is not diaphoretic. No erythema. No pallor.  Psychiatric: She has a normal mood and affect. Her behavior is normal. Judgment and thought content normal.          Assessment & Plan:   Triad of paroxysmal HTN, tachycardia, flushing headaches: she needs workup for pheochromocytoma  --check  24 hour urine metanephrines and cateacholamines I spent greater than 40 minutes with the patient including greater than 50% of time in face to face counsel of the patient and in coordination of their care.   RUQ and LUQ pain: --check CMP and UA --monitor  Latent Tb: continue  INH plus b6  And try to push to 9 months  Lung nodule: see above  Need for flu vaccine: give flu vaccine

## 2013-07-21 NOTE — Patient Instructions (Signed)
I am concerned with your triad of episodic flushing, hypertension, high heart rate that you could possibly have Pheochromocytoma  We need to obtain a 24 hour urine test in next day if possible esp since you are in midst of symptoms:  Here is more info on this possible diagnosis that we need to work up:    What is a pheochromocytoma? - A pheochromocytoma is a rare type of tumor. It usually happens in or near the adrenal glands. These are small organs that sit on top of each kidney (figure 1).  A pheochromocytoma makes too much of a hormone called "adrenaline." This is the "fight or flight" hormone. Having too much of this hormone can cause attacks of very high blood pressure, fast heart rate, sweating, and headaches. Some people call these attacks "spells."  Usually, pheochromocytomas happen in adults. But they sometimes happen in children and they can run in families. People with rare diseases such as multiple endocrine neoplasia (also called "MEN") and von Hippel-Lindau disease are more likely to have pheochromocytomas.  They are more likely to be cancerous in children than in adults. But a pheochromocytoma can be serious even if there is no cancer. The "fight or flight" hormone it makes can damage the heart and even cause death.  What are the symptoms of a pheochromocytoma? - Symptoms are different for each person. Adults and children can have different symptoms.  Common symptoms include:  ?Headaches - These can be mild or severe, and last a long or short time. ?Sweating ?Fast heartbeat, or feeling like the heart is beating very hard or skips beats ?High blood pressure ?Shaking ?Looking pale ?Feeling short of breath, weak, or both ?Feeling fearful or anxious ?Belly pain or swelling - This is more likely in children. Some people with pheochromocytomas do not have any symptoms.  Many people with pheochromocytomas have very high blood pressure that can be dangerous. Blood pressure can  go up and down if a person changes position or for other reasons.  Will I need tests? - Yes. Your doctor or nurse will talk with you and do an exam. He or she will also do lab tests to measure the hormone levels in your body. These include:  ?Blood tests ?Urine tests Before you have tests, tell the doctor or nurse about any medicines you take. This includes prescription and over-the-counter medicines. You might need to stop taking them before having tests. Some medicines change hormone levels, and this could make the test results wrong.  If blood or urine tests show someone has a pheochromocytoma, doctors do tests to find it in the body. These include:  ?CT or MRI scans of the adrenal glands ?Other imaging tests, if the CT or MRI does not show a tumor in the adrenal glands. How is a pheochromocytoma treated? - A pheochromocytoma is treated with surgery to remove it.  Before surgery, doctors give medicine to get the blood pressure and heart rate under control. This helps prevent serious problems during surgery. Medicine is given for about 2 weeks before surgery.  Pheochromoctyomas are usually not cancerous. But if a pheochromocytoma is cancerous, doctors take out as much of it as possible. They can also treat cancer with radiation, chemotherapy, or other treatments.  Most people are cured with surgery. But a pheochromocytoma can also come back, even after surgery. Sometimes, pheochromocytomas that were not cancerous at first come back as cancer. After treatment, plan to get checkups as often as the doctor recommends.  What if  I am pregnant? - If you are pregnant, the doctor can give medicine to help control symptoms from the pheochromocytoma. You might have surgery to remove it if you are less than [redacted] weeks pregnant.  When your baby has grown enough to live outside your body, the doctor can do a C-section (surgery to get the baby out). He or she can remove the tumor at the same time. If you have  a pheochromocytoma, having a C-section is safer than giving birth through your vagina.  More on this topic  Patient information: High blood pressure in adults (The Basics) Patient information: Tachycardia (The Basics)

## 2013-07-22 LAB — URINALYSIS, ROUTINE W REFLEX MICROSCOPIC
BILIRUBIN URINE: NEGATIVE
GLUCOSE, UA: NEGATIVE mg/dL
Hgb urine dipstick: NEGATIVE
Ketones, ur: NEGATIVE mg/dL
Leukocytes, UA: NEGATIVE
Nitrite: NEGATIVE
PH: 5.5 (ref 5.0–8.0)
Protein, ur: NEGATIVE mg/dL
SPECIFIC GRAVITY, URINE: 1.016 (ref 1.005–1.030)
Urobilinogen, UA: 0.2 mg/dL (ref 0.0–1.0)

## 2013-07-22 LAB — COMPLETE METABOLIC PANEL WITH GFR
ALK PHOS: 51 U/L (ref 39–117)
ALT: 14 U/L (ref 0–35)
AST: 20 U/L (ref 0–37)
Albumin: 4.6 g/dL (ref 3.5–5.2)
BILIRUBIN TOTAL: 0.3 mg/dL (ref 0.3–1.2)
BUN: 11 mg/dL (ref 6–23)
CO2: 27 mEq/L (ref 19–32)
Calcium: 9.1 mg/dL (ref 8.4–10.5)
Chloride: 105 mEq/L (ref 96–112)
Creat: 0.78 mg/dL (ref 0.50–1.10)
GFR, Est African American: >89 mL/min
GLUCOSE: 84 mg/dL (ref 70–99)
Potassium: 4 mEq/L (ref 3.5–5.3)
SODIUM: 140 meq/L (ref 135–145)
TOTAL PROTEIN: 6.9 g/dL (ref 6.0–8.3)

## 2013-07-23 ENCOUNTER — Other Ambulatory Visit (INDEPENDENT_AMBULATORY_CARE_PROVIDER_SITE_OTHER): Payer: Self-pay

## 2013-07-23 DIAGNOSIS — I10 Essential (primary) hypertension: Secondary | ICD-10-CM

## 2013-07-26 LAB — CATECHOLAMINES, FRACTIONATED, URINE, 24 HOUR
CALCULATED TOTAL (E+ NE): 49 ug/(24.h) (ref 26–121)
Creatinine, Urine mg/day-CATEUR: 1.22 g/(24.h) (ref 0.63–2.50)
DOPAMINE, 24 HR URINE: 367 ug/(24.h) (ref 52–480)
EPINEPHRINE, 24 HR URINE: 7 ug/(24.h) (ref 2–24)
NOREPINEPHRINE, 24 HR UR: 42 ug/(24.h) (ref 15–100)
TOTAL VOLUME - CF 24HR U: 2000 mL

## 2013-07-27 LAB — METANEPHRINES, URINE, 24 HOUR
Metaneph Total, Ur: 486 mcg/24 h (ref 115–695)
Metanephrines, Ur: 152 mcg/24 h (ref 36–190)
Normetanephrine, 24H Ur: 334 mcg/24 h (ref 35–482)

## 2013-09-29 ENCOUNTER — Telehealth: Payer: Self-pay | Admitting: *Deleted

## 2013-09-29 NOTE — Telephone Encounter (Signed)
Patient requesting a letter for [facility] stating she did have a reactive skin test but was not positive for TB. She will need note by October 05, 2013.   Please call when ready.     [phone]   Laverle Patter, RN

## 2013-10-01 ENCOUNTER — Telehealth: Payer: Self-pay | Admitting: *Deleted

## 2013-10-01 ENCOUNTER — Encounter: Payer: Self-pay | Admitting: Infectious Disease

## 2013-10-01 NOTE — Telephone Encounter (Addendum)
Requesting letter for [facility] that she does not have active TB.  Pt also requesting Flu Vaccine when she comes to pick up letter.  Pt will need a call back with letter is ready for pick up and a Nurse Injection appt.

## 2013-10-01 NOTE — Telephone Encounter (Signed)
Pt requested that letter be faxed to [phone].  Pt will obtain Flu vaccine at [facility] or [facility].

## 2014-03-24 ENCOUNTER — Ambulatory Visit (INDEPENDENT_AMBULATORY_CARE_PROVIDER_SITE_OTHER): Payer: Self-pay | Admitting: Obstetrics & Gynecology

## 2014-03-24 ENCOUNTER — Encounter: Payer: Self-pay | Admitting: Obstetrics & Gynecology

## 2014-03-24 VITALS — BP 122/64 | HR 82 | Temp 98.3°F | Resp 20 | Ht 65.5 in | Wt 120.1 lb

## 2014-03-24 DIAGNOSIS — Z0000 Encounter for general adult medical examination without abnormal findings: Secondary | ICD-10-CM

## 2014-03-24 MED ORDER — NORETHINDRONE 0.35 MG PO TABS: 1.0000 | ORAL_TABLET | Freq: Every day | ORAL | Status: DC

## 2014-03-24 NOTE — Progress Notes (Signed)
Subjective:    [name] is a 38 y.o. female who presents for an annual exam. The patient has no complaints today. The patient is sexually active. GYN screening history: last pap: was normal. The patient wears seatbelts: yes. The patient participates in regular exercise: yes. Has the patient ever been transfused or tattooed?: not asked. The patient reports that there is not domestic violence in her life.   Menstrual History: OB History   Grav Para Term Preterm Abortions TAB SAB Ect Mult Living   2 2 1 1      2       Menarche age: 89  Patient's last menstrual period was 03/13/2014.    The following portions of the patient's history were reviewed and updated as appropriate: allergies, current medications, past family history, past medical history, past social history, past surgical history and problem list.  Review of Systems A comprehensive review of systems was negative.    Objective:    BP 122/64  Pulse 82  Temp(Src) 98.3 F (36.8 C) (Oral)  Resp 20  Ht 5' 5.5" (1.664 m)  Wt 120 lb 1.6 oz (54.477 kg)  BMI 19.67 kg/m2  LMP 03/13/2014  General Appearance:    Alert, cooperative, no distress, appears stated age  Head:    Normocephalic, without obvious abnormality, atraumatic  Eyes:    PERRL, conjunctiva/corneas clear, EOM's intact, fundi    benign, both eyes  Ears:    Normal TM's and external ear canals, both ears  Nose:   Nares normal, septum midline, mucosa normal, no drainage    or sinus tenderness  Throat:   Lips, mucosa, and tongue normal; teeth and gums normal  Neck:   Supple, symmetrical, trachea midline, no adenopathy;    thyroid:  no enlargement/tenderness/nodules; no carotid   bruit or JVD  Back:     Symmetric, no curvature, ROM normal, no CVA tenderness  Lungs:     Clear to auscultation bilaterally, respirations unlabored  Chest Wall:    No tenderness or deformity   Heart:    Regular rate and rhythm, S1 and S2 normal, no murmur, rub   or gallop  Breast Exam:    No  tenderness, masses, or nipple abnormality  Abdomen:     Soft, non-tender, bowel sounds active all four quadrants,    no masses, no organomegaly  Genitalia:    Normal female without lesion, discharge or tenderness, NSSA, NT, mobile, normal adnexal exam     Extremities:   Extremities normal, atraumatic, no cyanosis or edema  Pulses:   2+ and symmetric all extremities  Skin:   Skin color, texture, turgor normal, no rashes or lesions  Lymph nodes:   Cervical, supraclavicular, and axillary nodes normal  Neurologic:   CNII-XII intact, normal strength, sensation and reflexes    throughout  .    Assessment:    Healthy female exam.    Plan:     Breast self exam technique reviewed and patient encouraged to perform self-exam monthly. Thin prep Pap smear. with cotesting POPs refilled

## 2014-03-24 NOTE — Progress Notes (Signed)
Pt ran out of OCP's in April - desires refill. Last clinic visit 11/2012 for Endo Bx results.  Last Pap in 2012.

## 2014-03-26 LAB — CYTOLOGY - PAP

## 2014-04-21 ENCOUNTER — Telehealth: Payer: Self-pay | Admitting: Pulmonary Disease

## 2014-04-21 DIAGNOSIS — R911 Solitary pulmonary nodule: Secondary | ICD-10-CM

## 2014-04-21 NOTE — Telephone Encounter (Signed)
Order has been placed for the ct scan and lab needed.  Note placed that the pt will need to see RA after for results.

## 2014-04-28 ENCOUNTER — Ambulatory Visit (INDEPENDENT_AMBULATORY_CARE_PROVIDER_SITE_OTHER)
Admission: RE | Admit: 2014-04-28 | Discharge: 2014-04-28 | Disposition: A | Discharge status: Home / Self Care | Payer: Self-pay | Source: Ambulatory Visit | Attending: Pulmonary Disease | Admitting: Pulmonary Disease

## 2014-04-28 DIAGNOSIS — R911 Solitary pulmonary nodule: Secondary | ICD-10-CM

## 2014-04-28 MED ORDER — IOHEXOL 300 MG/ML  SOLN
80.0000 mL | Freq: Once | INTRAMUSCULAR | Status: AC | PRN
  Administered 2014-04-28: 80 mL via INTRAVENOUS

## 2014-05-20 ENCOUNTER — Encounter: Payer: Self-pay | Admitting: Pulmonary Disease

## 2014-05-20 ENCOUNTER — Ambulatory Visit (INDEPENDENT_AMBULATORY_CARE_PROVIDER_SITE_OTHER): Payer: Self-pay | Admitting: Pulmonary Disease

## 2014-05-20 VITALS — BP 123/86 | HR 88 | Temp 97.7°F | Ht 65.5 in | Wt 119.0 lb

## 2014-05-20 DIAGNOSIS — R911 Solitary pulmonary nodule: Secondary | ICD-10-CM

## 2014-05-20 NOTE — Progress Notes (Signed)
   Subjective:    Patient ID: [name], female    DOB: 08/08/1975, 38 y.o.   MRN: [id number]  HPI  7/ F , smoker with h/o melanoma for FU of Left upper lobe nodule s/p VATS wedge resection.  January 2009 - She presented with night sweats and decreased appetite and 16 pounds wt loss from 123 in January to about 107 in 11/09. Marland Kitchen An ultrasound of the abdomen and pelvis showed a simple cyst in her left ovary. TSH was normal. She  an upper and lower endoscopy due to diarrhea and this was nondiagnostic. Apparently, she also had normal thyroid celiac testing and autoimmune testing. 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 A CT of the abdomen did not show any significant lymphadenopathy. A PPD test was placed and was marginally positive at 10 mm .  CT chest 2009>> central left upper lobe opacity with 1.1 x 0.9 cm AP window LN. No hilar lymphadenopathy. MRI of the brain and spinal cord was normal. Two subcentimeter right thyroid lobe nodules were noted which were too small for histologic sampling.  ANA, RA ,Urine histo Ag neg, ANCA neg. ACE level was low  Bronchoscopy 05/2008>>In the left upper lobe, a whitish endobronchial lesion was visualized in the anterior segment and one of the subsegments of the anterior segment.  Nondiagnostic biopsy, afb cx neg  CT 06/2008>> Significant improvement in the nodular opacity within the anteromedial left upper lobe most consistent with improving inflammatory process. Decrease in mediastinal nodes.  06/2008 Bscopy neg, Mediastinoscopy neg  09/2008 CT angio >> regressing LUL lesion, no PE  She subsequently underwent [facility] wedge resection at [facility].   Oncology ([place]) -PET 12/2010  showed recurrent parenchymal opacities in the LUL, SUV 5.7 - with mildly enalrged left paratracheal Lns , not hypermetabolic.  CT guided LN biopsy - neg for malignancy & afb & showed interstitial fibrosis.  Rpt labs  show ESR of 1, low CRP & no leucocytosis   CT angio from [facility]- 02/2013 >> unchanged LUL 18 mm nodule from 10/20/2008   05/20/2014  Chief Complaint  Patient presents with  . Follow-up    after CT scan. Pt states she has SOB and cough when she works in the yard.    She continues to smoke 7-8 cigs/d Has gained 10 lbs back CT 04/2014 regression of LUL nodule, now 11 mm -some endobronchial component  Review of Systems neg for any significant sore throat, dysphagia, itching, sneezing, nasal congestion or excess/ purulent secretions, fever, chills, sweats, unintended wt loss, pleuritic or exertional cp, hempoptysis, orthopnea pnd or change in chronic leg swelling. Also denies presyncope, palpitations, heartburn, abdominal pain, nausea, vomiting, diarrhea or change in bowel or urinary habits, dysuria,hematuria, rash, arthralgias, visual complaints, headache, numbness weakness or ataxia.     Objective:   Physical Exam  Gen. Pleasant, well-nourished, in no distress ENT - no lesions, no post nasal drip Neck: No JVD, no thyromegaly, no carotid bruits Lungs: no use of accessory muscles, no dullness to percussion, clear without rales or rhonchi  Cardiovascular: Rhythm regular, heart sounds  normal, no murmurs or gallops, no peripheral edema Musculoskeletal: No deformities, no cyanosis or clubbing        Assessment & Plan:

## 2014-05-20 NOTE — Patient Instructions (Signed)
CT scan in 1 year You have to STOP smoking!! Call for  Fever or yellow sputum

## 2014-05-21 NOTE — Assessment & Plan Note (Signed)
LUL 18 mm nodule - regressed to 42mm 04/2014 Unclear etio - maybe residual from surgery Smoking cessation discussed

## 2015-05-26 ENCOUNTER — Telehealth: Payer: Self-pay | Admitting: Pulmonary Disease

## 2015-05-26 DIAGNOSIS — R911 Solitary pulmonary nodule: Secondary | ICD-10-CM

## 2015-05-26 NOTE — Telephone Encounter (Signed)
Last seen on 05/20/14 with RA Patient Instructions     CT scan in 1 year You have to STOP smoking!! Call for Fever or yellow sputum   Called and spoke with pt. Informed her that order for CT will be placed. Pt scheduled ov with RA on 08/04/15. Pt voiced understanding and had no further questions. Order placed. Nothing further needed. Will sign off on message.

## 2015-06-21 ENCOUNTER — Telehealth: Payer: Self-pay | Admitting: Pulmonary Disease

## 2015-06-21 NOTE — Telephone Encounter (Signed)
Spoke with pt, wanting to know about her CT appt.  I was able to tell pt when her CT was scheduled but it just says it's at [place].  I'm unsure of what location this is scheduled at and cannot provide an address for this pt.  Pt had wanted this CT done at [facility].    PCC's please advise on location of this.  Thanks!

## 2015-06-22 NOTE — Telephone Encounter (Signed)
Pt was sched at [facility] CT. I rescheduled to [facility] [place] for 1/16 at 3:00. Called pt & gave her appt info. Nothing further is needed.

## 2015-08-01 ENCOUNTER — Other Ambulatory Visit: Payer: Self-pay

## 2015-08-01 ENCOUNTER — Ambulatory Visit (HOSPITAL_BASED_OUTPATIENT_CLINIC_OR_DEPARTMENT_OTHER)
Admission: RE | Admit: 2015-08-01 | Discharge: 2015-08-01 | Disposition: A | Discharge status: Home / Self Care | Payer: Self-pay | Source: Ambulatory Visit | Attending: Pulmonary Disease | Admitting: Pulmonary Disease

## 2015-08-01 DIAGNOSIS — Z85820 Personal history of malignant melanoma of skin: Secondary | ICD-10-CM | POA: Insufficient documentation

## 2015-08-01 DIAGNOSIS — J479 Bronchiectasis, uncomplicated: Secondary | ICD-10-CM | POA: Insufficient documentation

## 2015-08-01 DIAGNOSIS — R911 Solitary pulmonary nodule: Secondary | ICD-10-CM | POA: Insufficient documentation

## 2015-08-01 MED ORDER — IOHEXOL 300 MG/ML  SOLN
75.0000 mL | Freq: Once | INTRAMUSCULAR | Status: AC | PRN
  Administered 2015-08-01: 75 mL via INTRAVENOUS

## 2015-08-04 ENCOUNTER — Ambulatory Visit: Payer: Self-pay | Admitting: Pulmonary Disease

## 2015-08-08 ENCOUNTER — Encounter: Payer: Self-pay | Admitting: Pulmonary Disease

## 2015-08-08 ENCOUNTER — Ambulatory Visit (INDEPENDENT_AMBULATORY_CARE_PROVIDER_SITE_OTHER): Payer: Self-pay | Admitting: Pulmonary Disease

## 2015-08-08 VITALS — BP 112/68 | HR 78 | Ht 65.5 in | Wt 118.4 lb

## 2015-08-08 DIAGNOSIS — R911 Solitary pulmonary nodule: Secondary | ICD-10-CM

## 2015-08-08 DIAGNOSIS — Z716 Tobacco abuse counseling: Secondary | ICD-10-CM

## 2015-08-08 NOTE — Progress Notes (Signed)
Subjective:    Patient ID: [name], female    DOB: 06/28/1976, 40 y.o.   MRN: [id number]  HPI   56/ F , smoker with h/o melanoma for FU of Left upper lobe nodule s/p VATS wedge resection - c/w infectious/inflammatory process   08/08/2015  Chief Complaint  Patient presents with  . Follow-up    Pt states that she is doing well. Pt has occasional cough. Pt denies wheeze/SOB/CP/tightness.    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 Marland Kitchen An ultrasound of the abdomen and pelvis showed a simple cyst in her left ovary. TSH was normal. She had an upper and lower endoscopy due to diarrhea and this was nondiagnostic. Apparently, she also had normal thyroid celiac testing and autoimmune testing. 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 A CT of the abdomen did not show any significant lymphadenopathy. A PPD test was placed and was marginally positive at 10 mm .  CT chest 2009>> central left upper lobe opacity with 1.1 x 0.9 cm AP window LN. No hilar lymphadenopathy. MRI of the brain and spinal cord was normal. Two subcentimeter right thyroid lobe nodules were noted which were too small for histologic sampling.  ANA, RA ,Urine histo Ag neg, ANCA neg. ACE level was low  Bronchoscopy 05/2008>>In the left upper lobe, a whitish endobronchial lesion was visualized in the anterior segment and one of the subsegments of the anterior segment.  Nondiagnostic biopsy, afb cx neg  CT 06/2008>> Significant improvement in the nodular opacity  within the anteromedial left upper lobe most consistent with improving inflammatory process. Decrease in mediastinal nodes.  06/2008 Bscopy neg, Mediastinoscopy neg  09/2008 CT angio >> regressing LUL lesion, no PE  She subsequently underwent VATS wedge resection at [facility] - biopsy report [facility] - neg , chronic inflammation, respiratory bronchiolitis  Oncology ([facility]) -PET 12/2010  showed recurrent parenchymal opacities in the LUL, SUV 5.7 - with mildly enalrged left paratracheal Lns , not hypermetabolic.  CT guided LN biopsy - neg for malignancy & afb & showed interstitial fibrosis.  Rpt labs show ESR of 1, low CRP & no leucocytosis   CT angio from [facility]- 02/2013 >> unchanged LUL 18 mm nodule from 10/20/2008  CT 04/2014 regression of LUL nodule, now 11 mm -some endobronchial component CT chest 07/2015 nodule has resolved, focal bronchiectasis in that area  Review of Systems neg for any significant sore throat, dysphagia, itching, sneezing, nasal congestion or excess/ purulent secretions, fever, chills, sweats, unintended wt loss, pleuritic or exertional cp, hempoptysis, orthopnea pnd or change in chronic leg swelling. Also denies presyncope, palpitations, heartburn, abdominal pain, nausea, vomiting, diarrhea or change in bowel or urinary habits, dysuria,hematuria, rash, arthralgias, visual complaints, headache, numbness weakness or ataxia.     Objective:   Physical Exam  pleasant HEENT - no thrush, no post nasal drip no lymphadenopathy CVS- s1s2 nml, no murmur, no JVD RS- clear, no crackles or rhonchi Abd- soft, non-tender, no organomegaly CNS- non focal Ext - no clubbing, cyanosis  or edema         Assessment & Plan:

## 2015-08-08 NOTE — Patient Instructions (Signed)
CT scan shows nodule resolved You have to QUIT smoking !!

## 2015-08-10 NOTE — Assessment & Plan Note (Signed)
Smoking cessation counseling We discussed options including nicotine gum, electronic cigarette and Chantix

## 2015-08-10 NOTE — Assessment & Plan Note (Signed)
Nodule has resolved does not need follow-up imaging

## 2015-11-04 ENCOUNTER — Telehealth: Payer: Self-pay | Admitting: *Deleted

## 2015-11-04 NOTE — Telephone Encounter (Signed)
No answer, voicemail box full and unable to accept messages.

## 2015-11-07 ENCOUNTER — Encounter: Payer: Self-pay | Admitting: Family Medicine

## 2015-11-07 ENCOUNTER — Ambulatory Visit (INDEPENDENT_AMBULATORY_CARE_PROVIDER_SITE_OTHER): Payer: Self-pay | Admitting: Family Medicine

## 2015-11-07 VITALS — BP 130/80 | HR 85 | Temp 98.7°F | Resp 24 | Ht 65.5 in | Wt 118.2 lb

## 2015-11-07 DIAGNOSIS — Z7721 Contact with and (suspected) exposure to potentially hazardous body fluids: Secondary | ICD-10-CM

## 2015-11-07 DIAGNOSIS — Z23 Encounter for immunization: Secondary | ICD-10-CM

## 2015-11-07 LAB — HEPATITIS B SURFACE ANTIBODY, QUANTITATIVE: HEPATITIS B-POST: 0 m[IU]/mL

## 2015-11-07 NOTE — Progress Notes (Signed)
[place] at [facility] [address], [place], [place] [address] [phone]  Date:  11/07/2015   Name:  [name]   DOB:  04-Jan-1976   MRN:  [id number]  PCP:  Lamar Blinks, MD    Chief Complaint: Annual Exam   History of Present Illness:  [name] is a 40 y.o. 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 Episodic tension type headache 07/21/2013  . Paroxysmal hypertension 07/21/2013  . Back pain 02/13/2012  . Headache(784.0) 01/25/2012  . Endocrine disorder 01/25/2012  . Elevated blood pressure reading without diagnosis of hypertension 01/25/2012  . Family history of coronary artery disease 12/05/2010  . Dizziness 09/08/2010  . Irregular menses 08/24/2010  . FATIGUE 05/18/2010  . PALPITATIONS 05/18/2010  . MALIGNANT MELANOMA SKIN TRUNK EXCEPT SCROTUM 09/22/2008  . CHEST PAIN 09/22/2008  . DYSPLASTIC NEVUS, BACK 06/24/2008  . Solitary pulmonary nodule 06/24/2008  . 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 Tobacco abuse counseling 06/23/2008    Past Medical History  Diagnosis Date  . Migraine headache   . Positive PPD   . Hemorrhoids   . WEIGHT LOSS, ABNORMAL 06/24/2008    Qualifier: Diagnosis of  By: Tommy Medal MD, Neoma Laming: Diagnosis of  By: Walker Kehr MD, Patrick Jupiter    . Lung abscess ([place]) 01/15/2011  . Melanoma ([place])    stage 3  . Hot thyroid nodule   . Menorrhagia   . Pituitary cyst ([place])   . Heart murmur   . Vaso vagal episode     Past Surgical History  Procedure Laterality Date  . Bronchoscopy    . Mediastinoscopy    . Tubal ligation    . Lung removal, partial    . Wide excision      melanoma removed    Social History  Substance Use Topics  . Smoking status: Current Every Day Smoker -- 0.50 packs/day for 23 years    Types: Cigarettes  . Smokeless tobacco: Never Used  . Alcohol Use: No     Comment: none    Family History  Problem Relation Age of Onset  . Coronary artery disease    . Lymphoma    . COPD Mother   . Heart disease Mother   . Heart disease Father   . Diabetes Father   . Heart disease Sister   . Adrenal disorder Sister   . Heart disease Sister   . Pectus carinatum Son   . Heart murmur Daughter     No Known Allergies  Medication list has been reviewed and updated.  No current outpatient prescriptions on file prior to visit.   No current facility-administered medications on file prior to visit.    Review of Systems:  As per HPI- otherwise negative.  Physical Examination: Filed Vitals:   11/07/15 1513  Pulse: 85  Temp: 98.7 F (37.1 C)   Filed Vitals:   11/07/15 1513  Height: 5' 5.5" (1.664 m)  Weight: 118 lb 3.2 oz (53.615 kg)   Body mass index is 19.36 kg/(m^2). Ideal Body Weight: Weight in (lb) to have BMI = 25: 152.2  GEN: WDWN, NAD, Non-toxic, A & O x 3, thin build, looks well HEENT: Atraumatic, Normocephalic. Neck supple. No masses, No LAD.  Bilateral TM wnl, oropharynx normal.  PEERL,EOMI.   Ears and Nose: No external deformity. CV: RRR, No M/G/R. No JVD. No thrill. No extra heart sounds. PULM: CTA B, no wheezes, crackles, rhonchi. No retractions. No resp. distress. No accessory muscle use. ABD: S, NT, ND EXTR: No c/c/e.  Normal strength of all extremiites NEURO Normal gait.  PSYCH: Normally interactive. Conversant. Not depressed or  anxious appearing.  Calm demeanor.  Looks well, passes vision and hearing screens   Assessment and Plan: Exposure to blood or body fluid - Plan: Measles/Mumps/Rubella Immunity, Varicella zoster antibody, IgG, Hepatitis B surface antibody  Immunization due - Plan: Tdap vaccine greater than or equal to 7yo IM  Titers pending as above, Tdap given today Sent message to Dr. Elsworth Soho- ?does her recent lung CT provide evidence that she does NOT have TB?   Signed Lamar Blinks, MD

## 2015-11-07 NOTE — Patient Instructions (Signed)
It was very nice to see you today!  I will be in touch with your titers and will ask your pulmonologist about your TB situation

## 2015-11-07 NOTE — Progress Notes (Signed)
Pre visit review using our clinic tool,if applicable. No additional management support is needed unless otherwise documented below in the visit note.  

## 2015-11-08 LAB — MEASLES/MUMPS/RUBELLA IMMUNITY
Mumps IgG: 32.8 AU/mL — ABNORMAL HIGH (ref <9.00)
Rubella: 4.68 Index — ABNORMAL HIGH (ref <0.90)
Rubeola IgG: >300 AU/mL — ABNORMAL HIGH (ref <25.00)

## 2015-11-08 LAB — VARICELLA ZOSTER ANTIBODY, IGG: VARICELLA IGG: 2155 {index} — AB (ref <135.00)

## 2015-11-10 ENCOUNTER — Encounter: Payer: Self-pay | Admitting: Family Medicine

## 2015-11-11 ENCOUNTER — Encounter: Payer: Self-pay | Admitting: Family Medicine

## 2015-12-06 ENCOUNTER — Ambulatory Visit (INDEPENDENT_AMBULATORY_CARE_PROVIDER_SITE_OTHER): Payer: Self-pay

## 2015-12-06 DIAGNOSIS — Z111 Encounter for screening for respiratory tuberculosis: Secondary | ICD-10-CM

## 2015-12-06 DIAGNOSIS — Z308 Encounter for other contraceptive management: Secondary | ICD-10-CM

## 2015-12-06 MED ORDER — MEDROXYPROGESTERONE ACETATE 150 MG/ML IM SUSP: 150.0000 mg | Freq: Once | INTRAMUSCULAR | Status: DC

## 2015-12-06 NOTE — Progress Notes (Deleted)
Pre visit review using our clinic tool,if applicable. No additional management support is needed unless otherwise documented below in the visit note.   Patient in for Depo Provea Injection. Per order from Dr. Birdie Riddle. Given 150 mg IM Right deltoid. Patient tolerated well.

## 2015-12-07 LAB — QUANTIFERON TB GOLD ASSAY (BLOOD)
INTERFERON GAMMA RELEASE ASSAY: NEGATIVE
Mitogen-Nil: >10 IU/mL
QUANTIFERON NIL VALUE: 0.01 [IU]/mL
Quantiferon Tb Ag Minus Nil Value: 0.03 IU/mL

## 2015-12-21 ENCOUNTER — Encounter: Payer: Self-pay | Admitting: Family Medicine

## 2015-12-23 ENCOUNTER — Telehealth: Payer: Self-pay | Admitting: Family Medicine

## 2015-12-23 ENCOUNTER — Ambulatory Visit (INDEPENDENT_AMBULATORY_CARE_PROVIDER_SITE_OTHER): Payer: Self-pay | Admitting: *Deleted

## 2015-12-23 ENCOUNTER — Other Ambulatory Visit: Payer: Self-pay | Admitting: Family Medicine

## 2015-12-23 DIAGNOSIS — Z1231 Encounter for screening mammogram for malignant neoplasm of breast: Secondary | ICD-10-CM

## 2015-12-23 DIAGNOSIS — Z23 Encounter for immunization: Secondary | ICD-10-CM

## 2015-12-23 DIAGNOSIS — Z1239 Encounter for other screening for malignant neoplasm of breast: Secondary | ICD-10-CM

## 2015-12-23 NOTE — Progress Notes (Signed)
Pre visit review using our clinic review tool, if applicable. No additional management support is needed unless otherwise documented below in the visit note.  Pt tolerated injection well.   Shayle Donahoo J Caldonia Leap, RN  

## 2015-12-23 NOTE — Telephone Encounter (Signed)
Relation to RA:XENM Call back number:[phone] Pharmacy:  Reason for call:  Patient requesting 3D [facility] located at [facility] in [facility]. Please advise

## 2015-12-26 ENCOUNTER — Encounter: Payer: Self-pay | Admitting: Family Medicine

## 2015-12-26 ENCOUNTER — Ambulatory Visit (INDEPENDENT_AMBULATORY_CARE_PROVIDER_SITE_OTHER): Payer: Self-pay | Admitting: Family Medicine

## 2015-12-26 ENCOUNTER — Ambulatory Visit (HOSPITAL_BASED_OUTPATIENT_CLINIC_OR_DEPARTMENT_OTHER)
Admission: RE | Admit: 2015-12-26 | Discharge: 2015-12-26 | Disposition: A | Discharge status: Home / Self Care | Payer: Self-pay | Source: Ambulatory Visit | Attending: Family Medicine | Admitting: Family Medicine

## 2015-12-26 VITALS — BP 119/87 | HR 94 | Temp 98.7°F | Ht 66.0 in | Wt 114.8 lb

## 2015-12-26 DIAGNOSIS — Z1231 Encounter for screening mammogram for malignant neoplasm of breast: Secondary | ICD-10-CM | POA: Insufficient documentation

## 2015-12-26 DIAGNOSIS — R634 Abnormal weight loss: Secondary | ICD-10-CM

## 2015-12-26 DIAGNOSIS — R946 Abnormal results of thyroid function studies: Secondary | ICD-10-CM

## 2015-12-26 DIAGNOSIS — R7989 Other specified abnormal findings of blood chemistry: Secondary | ICD-10-CM

## 2015-12-26 LAB — COMPREHENSIVE METABOLIC PANEL
ALBUMIN: 4.5 g/dL (ref 3.5–5.2)
ALK PHOS: 44 U/L (ref 39–117)
ALT: 14 U/L (ref 0–35)
AST: 19 U/L (ref 0–37)
BUN: 11 mg/dL (ref 6–23)
CHLORIDE: 104 meq/L (ref 96–112)
CO2: 27 mEq/L (ref 19–32)
CREATININE: 0.76 mg/dL (ref 0.40–1.20)
Calcium: 9.4 mg/dL (ref 8.4–10.5)
GFR: 89.17 mL/min (ref >60.00)
GLUCOSE: 100 mg/dL — AB (ref 70–99)
POTASSIUM: 3.9 meq/L (ref 3.5–5.1)
SODIUM: 138 meq/L (ref 135–145)
TOTAL PROTEIN: 7.5 g/dL (ref 6.0–8.3)
Total Bilirubin: 0.7 mg/dL (ref 0.2–1.2)

## 2015-12-26 LAB — TSH: TSH: 0.77 u[IU]/mL (ref 0.35–4.50)

## 2015-12-26 LAB — CBC
HEMATOCRIT: 39.8 % (ref 36.0–46.0)
HEMOGLOBIN: 13.4 g/dL (ref 12.0–15.0)
MCHC: 33.7 g/dL (ref 30.0–36.0)
MCV: 90.3 fl (ref 78.0–100.0)
Platelets: 191 10*3/uL (ref 150.0–400.0)
RBC: 4.4 Mil/uL (ref 3.87–5.11)
RDW: 13 % (ref 11.5–15.5)
WBC: 7.9 10*3/uL (ref 4.0–10.5)

## 2015-12-26 LAB — T4, FREE: Free T4: 0.88 ng/dL (ref 0.60–1.60)

## 2015-12-26 LAB — T3, FREE: T3 FREE: 2.9 pg/mL (ref 2.3–4.2)

## 2015-12-26 NOTE — Progress Notes (Addendum)
[place] at [facility] [address], [place], [place] [address] [phone]  Date:  12/26/2015   Name:  [name]   DOB:  1975/09/14   MRN:  [id number]  PCP:  Lamar Blinks, MD    Chief Complaint: Establish Care   History of Present Illness:  [name] is a 40 y.o. very pleasant female patient who presents with the following:  Seen by myself a couple of months ago for testing needed to enter nursing school.   She has not seen a doctor for a regular check up recently.   She did have a lung abscess in 2010.   This was thought to be related to a melanoma. Was treated and she returned to her normal health.   Negative chest CT in January of this year. She has been released by Dr. 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  She was also noted to have an elevated T4, normal TSH at that time.    Also a couple of years ago they went on a cruise; she noted that "my bowels have been different ever since."   She notes that she tends to have loose stools and diarrhea most of the time.   She will go every day- in the morning. She is quite regular No abd pain She has tried to gain weight but is not able to.  Admits that her appetite is not that great and that she often does not eat much S/p vaginal delivery x2, no other abdominal operations  Wt Readings from Last 3 Encounters:  12/26/15 114 lb 12.8 oz (52.073 kg)  11/07/15 118 lb 3.2 oz (53.615 kg)  08/08/15 118 lb 6.4 oz (53.706 kg)     Patient Active Problem List   Diagnosis Date Noted  . Episodic tension type headache 07/21/2013  . Paroxysmal hypertension 07/21/2013  . Back pain 02/13/2012  . Headache(784.0) 01/25/2012  . Endocrine disorder 01/25/2012  . Elevated blood pressure reading without diagnosis of hypertension 01/25/2012  . Family history of coronary artery disease 12/05/2010  . Dizziness 09/08/2010  . Irregular menses 08/24/2010  .  FATIGUE 05/18/2010  . PALPITATIONS 05/18/2010  . MALIGNANT MELANOMA SKIN TRUNK EXCEPT SCROTUM 09/22/2008  . CHEST PAIN 09/22/2008  . DYSPLASTIC NEVUS, BACK 06/24/2008  . Solitary pulmonary nodule 06/24/2008  . 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 Tobacco abuse counseling 06/23/2008    Past Medical History  Diagnosis Date  . Migraine headache   . Positive PPD   . Hemorrhoids   . WEIGHT LOSS, ABNORMAL 06/24/2008    Qualifier: Diagnosis of  By: Tommy Medal MD, Neoma Laming: Diagnosis of  By: Walker Kehr MD, Patrick Jupiter    . Lung abscess ([place]) 01/15/2011  . Melanoma ([place])     stage 3  . Hot thyroid nodule   . Menorrhagia   . Pituitary cyst ([place])   . Heart murmur   . Vaso vagal episode     Past Surgical History  Procedure Laterality Date  . Bronchoscopy    . Mediastinoscopy    . Tubal ligation    . Lung removal, partial    . Wide excision      melanoma removed    Social History  Substance Use Topics  . Smoking status: Current Every Day Smoker -- 0.50 packs/day for 23 years    Types: Cigarettes  . Smokeless tobacco: Never Used  . Alcohol  Use: No     Comment: none    Family History  Problem Relation Age of Onset  . Coronary artery disease    . Lymphoma    . COPD Mother   . Heart disease Mother   . Heart disease Father   . Diabetes Father   . Heart disease Sister   . Adrenal disorder Sister   . Heart disease Sister   . Pectus carinatum Son   . Heart murmur Daughter     No Known Allergies  Medication list has been reviewed and updated.  No current outpatient prescriptions on file prior to visit.   Current Facility-Administered Medications on File Prior to Visit  Medication Dose Route Frequency Provider Last Rate Last Dose  . medroxyPROGESTERone (DEPO-PROVERA) injection 150 mg  150 mg Intramuscular Once Midge Minium, MD        Review of Systems:  As per HPI- otherwise negative.   Physical Examination: Filed Vitals:   12/26/15 0936 12/26/15 0946  BP: 135/99  119/87  Pulse: 94   Temp: 98.7 F (37.1 C)    Filed Vitals:   12/26/15 0936  Height: '5\' 6"'$  (1.676 m)  Weight: 114 lb 12.8 oz (52.073 kg)   Body mass index is 18.54 kg/(m^2). Ideal Body Weight: Weight in (lb) to have BMI = 25: 154.6  GEN: WDWN, NAD, Non-toxic, A & O x 3, looks well, slim build HEENT: Atraumatic, Normocephalic. Neck supple. No masses, No LAD. Ears and Nose: No external deformity. CV: RRR, No M/G/R. No JVD. No thrill. No extra heart sounds. PULM: CTA B, no wheezes, crackles, rhonchi. No retractions. No resp. distress. No accessory muscle use. ABD: S, NT, ND, +BS. No rebound. No HSM.  Benign belly EXTR: No c/c/e NEURO Normal gait.  PSYCH: Normally interactive. Conversant. Not depressed or anxious appearing.  Calm demeanor.    Assessment and Plan: Elevated serum free T4 level - Plan: TSH, T3, free, T4, free  Loss of weight - Plan: Comprehensive metabolic panel, CBC  She has noted weight loss/ inability to gain and persistent loose stools There was a question of hyperthyroidism in the past Will check thyroid labs, CBC and CMP.  Will start with a conservative eval as she is cash pay.  Await results and will be in touch with her asap   Signed Lamar Blinks, MD

## 2015-12-26 NOTE — Patient Instructions (Signed)
I will check your labs today- we will look for any thyroid problem I will be in touch with your labs asap!  Take care

## 2015-12-26 NOTE — Progress Notes (Signed)
Pre visit review using our clinic review tool, if applicable. No additional management support is needed unless otherwise documented below in the visit note. 

## 2016-01-20 ENCOUNTER — Ambulatory Visit (INDEPENDENT_AMBULATORY_CARE_PROVIDER_SITE_OTHER): Payer: Self-pay | Admitting: *Deleted

## 2016-01-20 DIAGNOSIS — Z23 Encounter for immunization: Secondary | ICD-10-CM

## 2016-01-20 NOTE — Progress Notes (Signed)
Pre visit review using our clinic review tool, if applicable. No additional management support is needed unless otherwise documented below in the visit note.  Patient tolerated injection well.  Sapphire Tygart J Glendel Jaggers, RN 

## 2016-07-24 ENCOUNTER — Ambulatory Visit: Payer: Self-pay

## 2016-07-25 ENCOUNTER — Ambulatory Visit (INDEPENDENT_AMBULATORY_CARE_PROVIDER_SITE_OTHER): Payer: Self-pay | Admitting: Behavioral Health

## 2016-07-25 DIAGNOSIS — Z23 Encounter for immunization: Secondary | ICD-10-CM

## 2016-07-25 NOTE — Progress Notes (Signed)
Pre visit review using our clinic review tool, if applicable. No additional management support is needed unless otherwise documented below in the visit note.  Patient in clinic today for 3rd Hepatitis B vaccination per Lab Note 11/07/15. IM given in Right Deltoid. Patient tolerated injection well.

## 2016-09-17 ENCOUNTER — Encounter: Payer: Self-pay | Admitting: Family Medicine

## 2016-09-17 ENCOUNTER — Ambulatory Visit (INDEPENDENT_AMBULATORY_CARE_PROVIDER_SITE_OTHER): Payer: BLUE CROSS/BLUE SHIELD | Admitting: Family Medicine

## 2016-09-17 ENCOUNTER — Encounter: Payer: Self-pay | Admitting: Internal Medicine

## 2016-09-17 ENCOUNTER — Telehealth: Payer: Self-pay | Admitting: Family Medicine

## 2016-09-17 VITALS — BP 110/60 | HR 82 | Temp 99.0°F | Ht 65.6 in | Wt 126.8 lb

## 2016-09-17 DIAGNOSIS — Z8639 Personal history of other endocrine, nutritional and metabolic disease: Secondary | ICD-10-CM

## 2016-09-17 DIAGNOSIS — K921 Melena: Secondary | ICD-10-CM | POA: Diagnosis not present

## 2016-09-17 LAB — IFOBT (OCCULT BLOOD): IFOBT: NEGATIVE

## 2016-09-17 NOTE — Patient Instructions (Signed)
I am going to refer you to GI to make sure all is well Let me know if any concerns or changes in the meantime

## 2016-09-17 NOTE — Progress Notes (Signed)
[place] at [facility], [place], [place] [address] [phone]  Date:  09/17/2016   Name:  [name]   DOB:  1975-11-02   MRN:  [id number]  PCP:  Lamar Blinks, MD    Chief Complaint: Rectal Bleeding (x4 days)   History of Present Illness:  [name] is a 41 y.o. very pleasant female patient who presents with the following:  She has noted some blood in her stools- she generally has a BM every morning. This past Friday she noted that the toilet water appeared red.  She had some blood on the TP Saturday when she wiped.    She notes that she has often had diarrhea since 2014  She is not aware of any family history of colon cancer- her mother did have some polpys, and an aunt had stomach cancer She is eating well, she has gained some weight which is good  She has noted some blood in her stool on approx 10 occasions over the last 2 years.  However she had always chalked this up to constipation and probable hemorrhoids from past operations She does not have any belly pain and is overall feeling fine  Wt Readings from Last 3 Encounters:  09/17/16 126 lb 12.8 oz (57.5 kg)  12/26/15 114 lb 12.8 oz (52.1 kg)  11/07/15 118 lb 3.2 oz (53.6 kg)     She did have melanoma several years ago  Lab Results  Component Value Date   TSH 0.77 12/26/2015     Patient Active Problem List   Diagnosis Date Noted  . Episodic tension type headache 07/21/2013  . Paroxysmal hypertension 07/21/2013  . Back pain 02/13/2012  . Headache(784.0) 01/25/2012  . Endocrine disorder 01/25/2012  . Elevated blood pressure reading without diagnosis of hypertension 01/25/2012  . Family history of coronary artery disease 12/05/2010  . Dizziness 09/08/2010  . Irregular menses 08/24/2010  . FATIGUE 05/18/2010  . PALPITATIONS 05/18/2010  . MALIGNANT MELANOMA SKIN TRUNK EXCEPT SCROTUM 09/22/2008  . CHEST PAIN 09/22/2008  . DYSPLASTIC NEVUS, BACK  06/24/2008  . Solitary pulmonary nodule 06/24/2008  . 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 Tobacco abuse counseling 06/23/2008    Past Medical History:  Diagnosis Date  . Heart murmur   . Hemorrhoids   . Hot thyroid nodule   . Lung abscess ([place]) 01/15/2011  . Melanoma ([place])    stage 3  . Menorrhagia   . Migraine headache   . Pituitary cyst ([place])   . Positive PPD   . Vaso vagal episode   . WEIGHT LOSS, ABNORMAL 06/24/2008   Qualifier: Diagnosis of  By: Tommy Medal MD, Neoma Laming: Diagnosis of  By: Walker Kehr MD, Patrick Jupiter      Past Surgical History:  Procedure Laterality Date  . BRONCHOSCOPY    . LUNG REMOVAL, PARTIAL    . MEDIASTINOSCOPY    . TUBAL LIGATION    . wide excision     melanoma removed    Social History  Substance Use Topics  . Smoking status: Current Every Day Smoker    Packs/day: 0.50    Years: 23.00    Types: Cigarettes  . Smokeless tobacco: Never Used  . Alcohol use No     Comment: none    Family History  Problem Relation Age of Onset  . Coronary artery disease    . Lymphoma    . COPD Mother   . Heart disease Mother   .  Heart disease Father   . Diabetes Father   . Heart disease Sister   . Adrenal disorder Sister   . Heart disease Sister   . Pectus carinatum Son   . Heart murmur Daughter     No Known Allergies  Medication list has been reviewed and updated.  No current outpatient prescriptions on file prior to visit.   No current facility-administered medications on file prior to visit.     Review of Systems:  As per HPI- otherwise negative.   Physical Examination: Vitals:   09/17/16 1607  BP: 110/60  Pulse: 82  Temp: 99 F (37.2 C)   Vitals:   09/17/16 1607  Weight: 126 lb 12.8 oz (57.5 kg)  Height: 5' 5.6" (1.666 m)   Body mass index is 20.72 kg/m. Ideal Body Weight: Weight in (lb) to have BMI = 25: 152.7  GEN: WDWN, NAD, Non-toxic, A & O x 3 HEENT: Atraumatic, Normocephalic. Neck supple. No masses, No LAD. Ears and Nose:  No external deformity. CV: RRR, No M/G/R. No JVD. No thrill. No extra heart sounds. PULM: CTA B, no wheezes, crackles, rhonchi. No retractions. No resp. distress. No accessory muscle use. ABD: S, NT, ND, +BS. No rebound. No HSM.  Benign belly EXTR: No c/c/e NEURO Normal gait.  PSYCH: Normally interactive. Conversant. Not depressed or anxious appearing.  Calm demeanor.  Rectal exam: small external hemorrhoids, no evidence of acute bleeding. DRE is normal, no gross blood Results for orders placed or performed in visit on 09/17/16  IFOBT POC (occult bld, rslt in office)  Result Value Ref Range   IFOBT Negative      Assessment and Plan: Blood in stool - Plan: IFOBT POC (occult bld, rslt in office), Ambulatory referral to Gastroenterology, CBC  History of thyroid disorder - Plan: TSH  Referral to GI for recurrent blood in stool Also will check her TSH and CBC today Will plan further follow- up pending labs.   Signed Lamar Blinks, MD

## 2016-09-17 NOTE — Progress Notes (Signed)
Pre visit review using our clinic tool,if applicable. No additional management support is needed unless otherwise documented below in the visit note.  

## 2016-09-17 NOTE — Telephone Encounter (Signed)
Pt called in to schedule an appt with PCP for blood in stool for 4 days. Scheduled pt but also transferred to [facility].

## 2016-09-17 NOTE — Telephone Encounter (Signed)
Patient Name: [name] DOB: [birth year] Initial Comment Caller states has blood in stool Nurse Assessment Nurse: [name], RN, [name] Date/Time [name] Time): 09/17/2016 9:14:28 AM Confirm and document reason for call. If symptomatic, describe symptoms. ---Caller states she has blood in her stool, she first noticed this past Friday. Said toilet water was even red on Friday and large amount of blood in the stool. Since than she has seen a small amount of blood in her stool since than. Says her stool consistency is never the same everyday. This morning she did not see any blood in the stool. No fever or any other symptoms currently. Does the patient have any new or worsening symptoms? ---Yes Will a triage be completed? ---Yes Related visit to physician within the last 2 weeks? ---No Does the PT have any chronic conditions? (i.e. diabetes, asthma, etc.) ---No Is the patient pregnant or possibly pregnant? (Ask all females between the ages of 39-55) ---No Is this a behavioral health or substance abuse call? ---No Guidelines Guideline Title Affirmed Question Affirmed Notes Rectal Bleeding MILD rectal bleeding (more than just a few drops or streaks) Final Disposition User See PCP When Office is Open (within 3 days) [name], RN, [name] Comments Pt reports she already has an appt scheduled for today at 3:45p with [name] Referrals REFERRED TO PCP OFFICE Disagree/Comply: Comply

## 2016-09-18 LAB — CBC
HCT: 39.2 % (ref 36.0–46.0)
Hemoglobin: 13.2 g/dL (ref 12.0–15.0)
MCHC: 33.7 g/dL (ref 30.0–36.0)
MCV: 89.4 fl (ref 78.0–100.0)
Platelets: 190 10*3/uL (ref 150.0–400.0)
RBC: 4.38 Mil/uL (ref 3.87–5.11)
RDW: 13.4 % (ref 11.5–15.5)
WBC: 9.8 10*3/uL (ref 4.0–10.5)

## 2016-09-18 LAB — TSH: TSH: 1.13 u[IU]/mL (ref 0.35–4.50)

## 2016-10-25 ENCOUNTER — Ambulatory Visit: Payer: BLUE CROSS/BLUE SHIELD | Admitting: Internal Medicine

## 2016-11-15 ENCOUNTER — Encounter: Payer: Self-pay | Admitting: Family Medicine

## 2016-11-15 ENCOUNTER — Ambulatory Visit (HOSPITAL_BASED_OUTPATIENT_CLINIC_OR_DEPARTMENT_OTHER)
Admission: RE | Admit: 2016-11-15 | Discharge: 2016-11-15 | Disposition: A | Discharge status: Home / Self Care | Payer: BLUE CROSS/BLUE SHIELD | Source: Ambulatory Visit | Attending: Family Medicine | Admitting: Family Medicine

## 2016-11-15 ENCOUNTER — Telehealth: Payer: Self-pay | Admitting: *Deleted

## 2016-11-15 ENCOUNTER — Ambulatory Visit (INDEPENDENT_AMBULATORY_CARE_PROVIDER_SITE_OTHER): Payer: BLUE CROSS/BLUE SHIELD | Admitting: Family Medicine

## 2016-11-15 VITALS — BP 130/82 | HR 128 | Temp 98.8°F | Ht 65.5 in | Wt 127.0 lb

## 2016-11-15 DIAGNOSIS — R509 Fever, unspecified: Secondary | ICD-10-CM

## 2016-11-15 DIAGNOSIS — R590 Localized enlarged lymph nodes: Secondary | ICD-10-CM | POA: Insufficient documentation

## 2016-11-15 DIAGNOSIS — R059 Cough, unspecified: Secondary | ICD-10-CM

## 2016-11-15 DIAGNOSIS — R000 Tachycardia, unspecified: Secondary | ICD-10-CM

## 2016-11-15 DIAGNOSIS — R062 Wheezing: Secondary | ICD-10-CM

## 2016-11-15 DIAGNOSIS — R05 Cough: Secondary | ICD-10-CM | POA: Diagnosis not present

## 2016-11-15 LAB — CBC
HEMATOCRIT: 40.2 % (ref 36.0–46.0)
Hemoglobin: 13.5 g/dL (ref 12.0–15.0)
MCHC: 33.5 g/dL (ref 30.0–36.0)
MCV: 89.7 fl (ref 78.0–100.0)
Platelets: 179 10*3/uL (ref 150.0–400.0)
RBC: 4.48 Mil/uL (ref 3.87–5.11)
RDW: 13.5 % (ref 11.5–15.5)
WBC: 10.1 10*3/uL (ref 4.0–10.5)

## 2016-11-15 LAB — POCT INFLUENZA A/B
INFLUENZA A, POC: NEGATIVE
INFLUENZA B, POC: NEGATIVE

## 2016-11-15 LAB — D-DIMER, QUANTITATIVE: D-Dimer, Quant: 0.9 mcg/mL FEU — ABNORMAL HIGH (ref <0.50)

## 2016-11-15 MED ORDER — ALBUTEROL SULFATE HFA 108 (90 BASE) MCG/ACT IN AERS: 2.0000 | INHALATION_SPRAY | Freq: Four times a day (QID) | RESPIRATORY_TRACT | 0 refills | Status: DC | PRN

## 2016-11-15 MED ORDER — IPRATROPIUM-ALBUTEROL 0.5-2.5 (3) MG/3ML IN SOLN
3.0000 mL | Freq: Four times a day (QID) | RESPIRATORY_TRACT | Status: DC
  Administered 2016-11-15: 3 mL via RESPIRATORY_TRACT

## 2016-11-15 MED ORDER — IOPAMIDOL (ISOVUE-370) INJECTION 76%
100.0000 mL | Freq: Once | INTRAVENOUS | Status: AC | PRN
  Administered 2016-11-15: 100 mL via INTRAVENOUS

## 2016-11-15 MED ORDER — HYDROCODONE-HOMATROPINE 5-1.5 MG/5ML PO SYRP: 5.0000 mL | ORAL_SOLUTION | Freq: Three times a day (TID) | ORAL | 0 refills | Status: DC | PRN

## 2016-11-15 MED ORDER — PREDNISONE 20 MG PO TABS: ORAL_TABLET | ORAL | 0 refills | Status: DC

## 2016-11-15 NOTE — Patient Instructions (Addendum)
We will get a D dimer for you to help ensure that you do not have a pulmonary embolus.  I will be in touch with this result later on today We will use albuterol as needed for wheezing and cough, prednisone, and the cough syrup as needed

## 2016-11-15 NOTE — Progress Notes (Signed)
[place] at [facility], [place], [place] [address] [phone]  Date:  11/15/2016   Name:  [name]   DOB:  1976/01/24   MRN:  [id number]  PCP:  Lamar Blinks, MD    Chief Complaint: Nasal Congestion (c/o nasal congestion, prod cough with green/gray mucus. )   History of Present Illness:  [name] is a 41 y.o. very pleasant female patient who presents with the following:  Last seen here about 2 months ago with blood in her stool- referred to GI History of hypertension, palpitations, tobacco abuse 2 days ago she noted onset of a scratchy throat and mild cough. Yesterday when she awoke she began coughing up "grey stuff," and also coughing up green and yellow mucus. She then noted onset of nasal drainage and nasal congestion, wheezing, and back pain esp with cough  She had a lobectomy due to lung abscess and had been a pt of Dr. Elsworth Soho in the past.  She had her lobectomy in 2010 -at first her lung mass was thought to be metestatic melanoma but turned out to be just an abscess.  She follow-up with Dr. Elsworth Soho until last year when he released her from pulmonology care.  However he did caution her to seek care for any lung problem so she wanted to be seen for this illness.    She has had a temp up to 99.9 Taking ibuprofen She felt somewhat achy but not as severe as would be expected with flu Has noted some SOB Her husband was seen yesterday with illness as well- similar sx Menses are current and she is s/p BTL  She is a smoker- 1/4 ppd No recent travel No history of DVT or PE History of melanoma   Patient Active Problem List   Diagnosis Date Noted  . Episodic tension type headache 07/21/2013  . Paroxysmal hypertension 07/21/2013  . Back pain 02/13/2012  . Headache(784.0) 01/25/2012  . Endocrine disorder 01/25/2012  . Elevated blood pressure reading without diagnosis of hypertension 01/25/2012  . Family history of  coronary artery disease 12/05/2010  . Dizziness 09/08/2010  . Irregular menses 08/24/2010  . FATIGUE 05/18/2010  . PALPITATIONS 05/18/2010  . MALIGNANT MELANOMA SKIN TRUNK EXCEPT SCROTUM 09/22/2008  . CHEST PAIN 09/22/2008  . DYSPLASTIC NEVUS, BACK 06/24/2008  . Solitary pulmonary nodule 06/24/2008  . 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 Tobacco abuse counseling 06/23/2008    Past Medical History:  Diagnosis Date  . Heart murmur   . Hemorrhoids   . Hot thyroid nodule   . Lung abscess ([place]) 01/15/2011  . Melanoma ([place])    stage 3  . Menorrhagia   . Migraine headache   . Pituitary cyst ([place])   . Positive PPD   . Vaso vagal episode   . WEIGHT LOSS, ABNORMAL 06/24/2008   Qualifier: Diagnosis of  By: Tommy Medal MD, Neoma Laming: Diagnosis of  By: Walker Kehr MD, Patrick Jupiter      Past Surgical History:  Procedure Laterality Date  . BRONCHOSCOPY    . LUNG REMOVAL, PARTIAL    . MEDIASTINOSCOPY    . TUBAL LIGATION    . wide excision     melanoma removed    Social History  Substance Use Topics  . Smoking status: Current Every Day Smoker    Packs/day: 0.50    Years: 23.00    Types: Cigarettes  . Smokeless tobacco: Never Used  . Alcohol use  No     Comment: none    Family History  Problem Relation Age of Onset  . COPD Mother   . Heart disease Mother   . Heart disease Father   . Diabetes Father   . Heart disease Sister   . Adrenal disorder Sister   . Heart disease Sister   . Pectus carinatum Son   . Heart murmur Daughter   . Coronary artery disease    . Lymphoma      No Known Allergies  Medication list has been reviewed and updated.  No current outpatient prescriptions on file prior to visit.   No current facility-administered medications on file prior to visit.     Review of Systems:  As per HPI- otherwise negative. Pulse Readings from Last 3 Encounters:  11/15/16 (!) 128  09/17/16 82  12/26/15 94    Physical Examination: Vitals:   11/15/16 1303  BP: 130/82   Pulse: (!) 128  Temp: 98.8 F (37.1 C)   Vitals:   11/15/16 1303  Weight: 127 lb (57.6 kg)  Height: 5' 5.5" (1.664 m)   Body mass index is 20.81 kg/m. Ideal Body Weight: Weight in (lb) to have BMI = 25: 152.2  GEN: WDWN, NAD, Non-toxic, A & O x 3, slim build, looks well HEENT: Atraumatic, Normocephalic. Neck supple. No masses, No LAD. Ears and Nose: No external deformity. CV: RRR- tachycardic but improved after neb, No M/G/R. No JVD. No thrill. No extra heart sounds. PULM:mild bilateral wheezing. nocrackles, rhonchi. No retractions. No resp. distress. No accessory muscle use. ABD: S, NT, ND, +BS. No rebound. No HSM. EXTR: No c/c/e NEURO Normal gait.  PSYCH: Normally interactive. Conversant. Not depressed or anxious appearing.  Calm demeanor.  Able to reproduce the pain in her back by pressing on her mid thoracic muscles   EKG: rate of 102, otherwise compared with 2012 and did not appreciate any significant differnece  Dg Chest 2 View  Result Date: 11/15/2016 CLINICAL DATA:  Cough and fever over the last 2 days. Productive cough. EXAM: CHEST  2 VIEW COMPARISON:  CT 08/01/2015 FINDINGS: Heart size is normal. There is been previous pulmonary resection in the left upper lobe. Lungs are clear. No infiltrate, collapse or effusion. No abnormal bone finding. IMPRESSION: No active disease.  Previous pulmonary resection left upper lobe. Electronically Signed   By: Nelson Chimes M.D.   On: 11/15/2016 13:36   duoneb today- she felt better, pulse to 96 after treatment   Assessment and Plan: Cough - Plan: DG Chest 2 View, albuterol (PROVENTIL HFA;VENTOLIN HFA) 108 (90 Base) MCG/ACT inhaler, HYDROcodone-homatropine (HYCODAN) 5-1.5 MG/5ML syrup, CBC  Wheezing - Plan: DG Chest 2 View, ipratropium-albuterol (DUONEB) 0.5-2.5 (3) MG/3ML nebulizer solution 3 mL, albuterol (PROVENTIL HFA;VENTOLIN HFA) 108 (90 Base) MCG/ACT inhaler, predniSONE (DELTASONE) 20 MG tablet  Low grade fever - Plan: POCT  Influenza A/B  Tachycardia - Plan: EKG 12-Lead, D-Dimer, Quantitative, CT Angio Chest W/Cm &/Or Wo Cm, CANCELED: POCT urine pregnancy  Here today with likely illness- chest congestion, wheeze and URI sx.  However she also has back pain, SOB and tachycardia.  Would like to rule out PE- pt agreeable to getting D dimer and understands that it may lead to a CT scan  Also rx for albuterol, hydocan syrup (cautioned re sedation) and prednisone  Meds ordered this encounter  Medications  . ipratropium-albuterol (DUONEB) 0.5-2.5 (3) MG/3ML nebulizer solution 3 mL  . albuterol (PROVENTIL HFA;VENTOLIN HFA) 108 (90 Base) MCG/ACT inhaler  Sig: Inhale 2 puffs into the lungs every 6 (six) hours as needed for wheezing or shortness of breath.    Dispense:  1 Inhaler    Refill:  0  . predniSONE (DELTASONE) 20 MG tablet    Sig: Take 2 pills a day for 3 days, then 1 a day for 3 days    Dispense:  9 tablet    Refill:  0  . HYDROcodone-homatropine (HYCODAN) 5-1.5 MG/5ML syrup    Sig: Take 5 mLs by mouth every 8 (eight) hours as needed for cough.    Dispense:  90 mL    Refill:  0     Signed Lamar Blinks, MD  Received her D dimer- it is positive  Results for orders placed or performed in visit on 11/15/16  D-Dimer, Quantitative  Result Value Ref Range   D-Dimer, Quant 0.90 (H) <0.50 mcg/mL FEU  POCT Influenza A/B  Result Value Ref Range   Influenza A, POC Negative Negative   Influenza B, POC Negative Negative    Called pt- will refer for a CT angiogram - she is in agreement  Received CT results  Dg Chest 2 View  Result Date: 11/15/2016 CLINICAL DATA:  Cough and fever over the last 2 days. Productive cough. EXAM: CHEST  2 VIEW COMPARISON:  CT 08/01/2015 FINDINGS: Heart size is normal. There is been previous pulmonary resection in the left upper lobe. Lungs are clear. No infiltrate, collapse or effusion. No abnormal bone finding. IMPRESSION: No active disease.  Previous pulmonary resection  left upper lobe. Electronically Signed   By: Nelson Chimes M.D.   On: 11/15/2016 13:36   Ct Angio Chest W/cm &/or Wo Cm  Result Date: 11/15/2016 CLINICAL DATA:  Back pain. Tachycardia. Elevated D-dimer. History of melanoma and positive PPD. Smoker. EXAM: CT ANGIOGRAPHY CHEST WITH CONTRAST TECHNIQUE: Multidetector CT imaging of the chest was performed using the standard protocol during bolus administration of intravenous contrast. Multiplanar CT image reconstructions and MIPs were obtained to evaluate the vascular anatomy. CONTRAST:  100 cc Isovue 370 COMPARISON:  Chest radiographs obtained earlier today. Chest CT dated 08/01/2015. FINDINGS: Cardiovascular: Satisfactory opacification of the pulmonary arteries to the segmental level. No evidence of pulmonary embolism. Normal heart size. No pericardial effusion. Mediastinum/Nodes: Mildly enlarged bilateral hilar lymph nodes. The largest right hilar node has a short axis diameter of 10 mm on image number 114 of series 6. The largest left hilar node has a short axis diameter of 12 mm on image number 125 of series 6. No enlarged axillary or upper abdominal lymph nodes. Lungs/Pleura: No lung nodules, airspace consolidation or pleural fluid. Upper Abdomen: Unremarkable. Musculoskeletal: Mild thoracolumbar spine degenerative changes. Bilateral retropectoral breast implants. Review of the MIP images confirms the above findings. IMPRESSION: 1. No pulmonary emboli or acute abnormality. 2. No evidence of pulmonary metastatic disease. 3. Interval mild bilateral hilar adenopathy.  This may be reactive. Electronically Signed   By: Claudie Revering M.D.   On: 11/15/2016 18:11   Called pt- CT is negative for a PE She is relieved and will use medications as planned, she will contact me if not feeling better in the next 1-2 days

## 2016-11-15 NOTE — Telephone Encounter (Signed)
CRITICAL VALUE STICKER  CRITICAL VALUE: D-dimer 0.90  RECEIVER (on-site recipient of call): Dorrene German, RN  DATE & TIME NOTIFIED: 11/15/16 4:07 PM  MESSENGER (representative from lab): Jonelle Sidle from [place]  MD NOTIFIED: Lamar Blinks, MD  TIME OF NOTIFICATION: 4:08 PM  RESPONSE: MD to contact patient w/ additional instructions

## 2017-04-12 NOTE — Progress Notes (Signed)
[place] at [facility], [place], [place] [address] [phone]  Date:  04/15/2017   Name:  [name]   DOB:  [birth year]   MRN:  [id number]  PCP:  Stephanie Mclean, MD    Chief Complaint: Palpitations (c/o heart palpitations that is noticed more with activity. )   History of Present Illness:  [name] is a 41 y.o. 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 Elsworth Soho in the past.  She had her lobectomy in 2010 -at first her lung mass was thought to be metestatic melanoma but turned out to be just an abscess.  She follow-up with Dr. Elsworth Soho until last year when he released her from pulmonology care.  However he did caution her to seek care for any lung problem so she wanted to be seen for this illness.  Here today with concern of heart palpitations  Here today with concern that she may be at risk of MI Her older sister had an MI last week which really scared her Her father had a serious MI at 13 and CABG at 20 [name] has noticed some symptoms that are concerning her- She has noted a squeezing feeling in her arms and her throat over the last few years.  It does not follow any particular pattern- may occur more when she is stressed  She has noted some palpations over the last weeks or months.  Palpitations =  her heart seems to be "beating really hard."  Also, if she stands up after squatting down she will feel SOB and have palpations.  She has never done a cardiac stress test but would like to do so  Pt is a smoker- she does desire to quit and plans to try using a nicotine patch She may notice some "pressure or tightness" in her chest - states that she does not have pain She is s/pt BTL We did see her for illness in May and she was noted to have heart palpations and tachycardia, but normal EKG  She is  fasting this am except for coffee   [name] cannot specifically tell me the last time she had her symptom.  Advised her that if she is having chest discomfort of tightness now, I would recommend that she go to the ER. She declines to do this BP Readings from Last 3 Encounters:  04/15/17 134/80  11/15/16 130/82  09/17/16 110/60   Pulse Readings from Last 3 Encounters:  04/15/17 (!) 127  11/15/16 (!) 128  09/17/16 82     Patient Active Problem List   Diagnosis Date Noted  . Episodic tension type headache 07/21/2013  . Paroxysmal hypertension 07/21/2013  . Back pain 02/13/2012  . Headache(784.0) 01/25/2012  . Endocrine disorder 01/25/2012  . Elevated blood pressure reading without diagnosis of hypertension 01/25/2012  . Family history of coronary artery disease 12/05/2010  . Dizziness 09/08/2010  . Irregular menses 08/24/2010  . FATIGUE 05/18/2010  . PALPITATIONS 05/18/2010  . MALIGNANT MELANOMA SKIN TRUNK EXCEPT SCROTUM 09/22/2008  . CHEST PAIN 09/22/2008  . DYSPLASTIC NEVUS, BACK 06/24/2008  . Solitary pulmonary nodule 06/24/2008  . 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 Tobacco abuse counseling 06/23/2008    Past Medical History:  Diagnosis Date  . Heart murmur   . Hemorrhoids   .  Hot thyroid nodule   . Lung abscess ([place]) 01/15/2011  . Melanoma ([place])    stage 3  . Menorrhagia   . Migraine headache   . Pituitary cyst ([place])   . Positive PPD   . Vaso vagal episode   . WEIGHT LOSS, ABNORMAL 06/24/2008   Qualifier: Diagnosis of  By: Tommy Medal MD, Neoma Laming: Diagnosis of  By: Walker Kehr MD, Patrick Jupiter      Past Surgical History:  Procedure Laterality Date  . BRONCHOSCOPY    . LUNG REMOVAL, PARTIAL    . MEDIASTINOSCOPY    . TUBAL LIGATION    . wide excision     melanoma removed    Social History  Substance Use Topics  . Smoking status: Current Every Day Smoker    Packs/day: 0.50    Years: 23.00    Types: Cigarettes  . Smokeless tobacco: Never Used  . Alcohol use No      Comment: none    Family History  Problem Relation Age of Onset  . COPD Mother   . Heart disease Mother   . Heart disease Father   . Diabetes Father   . Heart disease Sister   . Adrenal disorder Sister   . Heart disease Sister   . Pectus carinatum Son   . Heart murmur Daughter   . Coronary artery disease Unknown   . Lymphoma Unknown     No Known Allergies  Medication list has been reviewed and updated.  No current outpatient prescriptions on file prior to visit.   No current facility-administered medications on file prior to visit.     Review of Systems:  As per HPI- otherwise negative.   Physical Examination: Vitals:   04/15/17 0926  BP: 134/80  Pulse: (!) 127  Temp: 98.7 F (37.1 C)  SpO2: 99%   Vitals:   04/15/17 0926  Weight: 130 lb 6.4 oz (59.1 kg)  Height: 5\' 5"  (1.651 m)   Body mass index is 21.7 kg/m. Ideal Body Weight: Weight in (lb) to have BMI = 25: 149.9  GEN: WDWN, NAD, Non-toxic, A & O x 3, slim build, looks well HEENT: Atraumatic, Normocephalic. Neck supple. No masses, No LAD.  Bilateral TM wnl, oropharynx normal.  PEERL,EOMI.   Ears and Nose: No external deformity. CV: RRR, No M/G/R. No JVD. No thrill. No extra heart sounds. PULM: CTA B, no wheezes, crackles, rhonchi. No retractions. No resp. distress. No accessory muscle use. ABD: S, NT, ND, +BS. No rebound. No HSM. EXTR: No c/c/e NEURO Normal gait.  PSYCH: Normally interactive. Conversant. Not depressed or anxious appearing.  Calm demeanor.   EKG: rate of 89. PR is minimally short,  Otherwise no changes or concerning findings when compared with EKG from May of this year  Assessment and Plan: Palpitations - Plan: EKG 12-Lead, TSH, Troponin I, Ambulatory referral to Cardiology  Family history of premature CAD - Plan: Ambulatory referral to Cardiology  Tachycardia - Plan: Ambulatory referral to Cardiology  Screening for hyperlipidemia - Plan: Lipid panel  Screening for deficiency  anemia - Plan: CBC  Screening for diabetes mellitus - Plan: Comprehensive metabolic panel, Hemoglobin A1c  Immunization due - Plan: Flu Vaccine QUAD 36+ mos IM (Fluarix & Fluzone Quad PF  Chest discomfort - Plan: Ambulatory referral to Cardiology  Here today with concern of palpitations and intermittent chest discomfort.  Obtained a troponin today which is negative EKG does not show any acute findings Encourage tobacco cessation, and will refer to  cardiology asap.  Decided against simply ordering an ETT as I am not sure if other testing- such as echo- may be more appropriate in her case Advised a baby aspirin daily for now Will plan further follow- up pending labs. She will seek care if any change or worsening of her symptoms in the meantime   Signed Lamar Blinks, MD  Results for orders placed or performed in visit on 04/15/17  CBC  Result Value Ref Range   WBC 7.3 4.0 - 10.5 K/uL   RBC 4.34 3.87 - 5.11 Mil/uL   Platelets 199.0 150.0 - 400.0 K/uL   Hemoglobin 13.0 12.0 - 15.0 g/dL   HCT 38.4 36.0 - 46.0 %   MCV 88.4 78.0 - 100.0 fl   MCHC 33.9 30.0 - 36.0 g/dL   RDW 13.9 11.5 - 15.5 %  Comprehensive metabolic panel  Result Value Ref Range   Sodium 137 135 - 145 mEq/L   Potassium 3.9 3.5 - 5.1 mEq/L   Chloride 106 96 - 112 mEq/L   CO2 25 19 - 32 mEq/L   Glucose, Bld 90 70 - 99 mg/dL   BUN 7 6 - 23 mg/dL   Creatinine, Ser 0.72 0.40 - 1.20 mg/dL   Total Bilirubin 0.5 0.2 - 1.2 mg/dL   Alkaline Phosphatase 45 39 - 117 U/L   AST 16 0 - 37 U/L   ALT 11 0 - 35 U/L   Total Protein 6.7 6.0 - 8.3 g/dL   Albumin 4.3 3.5 - 5.2 g/dL   Calcium 9.5 8.4 - 10.5 mg/dL   GFR 94.31 >60.00 mL/min  Hemoglobin A1c  Result Value Ref Range   Hgb A1c MFr Bld 5.4 4.6 - 6.5 %  Lipid panel  Result Value Ref Range   Cholesterol 159 0 - 200 mg/dL   Triglycerides 71.0 0.0 - 149.0 mg/dL   HDL 52.10 >39.00 mg/dL   VLDL 14.2 0.0 - 40.0 mg/dL   LDL Cholesterol 92 0 - 99 mg/dL   Total CHOL/HDL  Ratio 3    NonHDL 106.45   TSH  Result Value Ref Range   TSH 0.41 0.35 - 4.50 uIU/mL  Troponin I  Result Value Ref Range   TNIDX 0.00 0.00 - 0.06 ug/l

## 2017-04-15 ENCOUNTER — Encounter: Payer: Self-pay | Admitting: Family Medicine

## 2017-04-15 ENCOUNTER — Ambulatory Visit (INDEPENDENT_AMBULATORY_CARE_PROVIDER_SITE_OTHER): Payer: Self-pay | Admitting: Family Medicine

## 2017-04-15 VITALS — BP 134/80 | HR 127 | Temp 98.7°F | Ht 65.0 in | Wt 130.4 lb

## 2017-04-15 DIAGNOSIS — Z130 Encounter for screening for diseases of the blood and blood-forming organs and certain disorders involving the immune mechanism: Secondary | ICD-10-CM

## 2017-04-15 DIAGNOSIS — Z23 Encounter for immunization: Secondary | ICD-10-CM

## 2017-04-15 DIAGNOSIS — R0789 Other chest pain: Secondary | ICD-10-CM

## 2017-04-15 DIAGNOSIS — Z131 Encounter for screening for diabetes mellitus: Secondary | ICD-10-CM

## 2017-04-15 DIAGNOSIS — Z13220 Encounter for screening for lipoid disorders: Secondary | ICD-10-CM

## 2017-04-15 DIAGNOSIS — R000 Tachycardia, unspecified: Secondary | ICD-10-CM

## 2017-04-15 DIAGNOSIS — R002 Palpitations: Secondary | ICD-10-CM

## 2017-04-15 DIAGNOSIS — Z8249 Family history of ischemic heart disease and other diseases of the circulatory system: Secondary | ICD-10-CM

## 2017-04-15 LAB — TSH: TSH: 0.41 u[IU]/mL (ref 0.35–4.50)

## 2017-04-15 LAB — CBC
HEMATOCRIT: 38.4 % (ref 36.0–46.0)
Hemoglobin: 13 g/dL (ref 12.0–15.0)
MCHC: 33.9 g/dL (ref 30.0–36.0)
MCV: 88.4 fl (ref 78.0–100.0)
Platelets: 199 10*3/uL (ref 150.0–400.0)
RBC: 4.34 Mil/uL (ref 3.87–5.11)
RDW: 13.9 % (ref 11.5–15.5)
WBC: 7.3 10*3/uL (ref 4.0–10.5)

## 2017-04-15 LAB — COMPREHENSIVE METABOLIC PANEL
ALT: 11 U/L (ref 0–35)
AST: 16 U/L (ref 0–37)
Albumin: 4.3 g/dL (ref 3.5–5.2)
Alkaline Phosphatase: 45 U/L (ref 39–117)
BUN: 7 mg/dL (ref 6–23)
CHLORIDE: 106 meq/L (ref 96–112)
CO2: 25 mEq/L (ref 19–32)
Calcium: 9.5 mg/dL (ref 8.4–10.5)
Creatinine, Ser: 0.72 mg/dL (ref 0.40–1.20)
GFR: 94.31 mL/min (ref >60.00)
GLUCOSE: 90 mg/dL (ref 70–99)
POTASSIUM: 3.9 meq/L (ref 3.5–5.1)
SODIUM: 137 meq/L (ref 135–145)
Total Bilirubin: 0.5 mg/dL (ref 0.2–1.2)
Total Protein: 6.7 g/dL (ref 6.0–8.3)

## 2017-04-15 LAB — LIPID PANEL
CHOL/HDL RATIO: 3
CHOLESTEROL: 159 mg/dL (ref 0–200)
HDL: 52.1 mg/dL (ref >39.00)
LDL Cholesterol: 92 mg/dL (ref 0–99)
NONHDL: 106.45
Triglycerides: 71 mg/dL (ref 0.0–149.0)
VLDL: 14.2 mg/dL (ref 0.0–40.0)

## 2017-04-15 LAB — TROPONIN I: TNIDX: 0 ug/l (ref 0.00–0.06)

## 2017-04-15 LAB — HEMOGLOBIN A1C: HEMOGLOBIN A1C: 5.4 % (ref 4.6–6.5)

## 2017-04-15 NOTE — Patient Instructions (Addendum)
We will get labs and a stat Troponin I for you today I am also going to refer you to cardiology to help [place] determine any further testing that you may need Please do work on quitting smoking- this is the best think you can do to help protect your heart  Start on a baby aspirin daily until you see cardiology   You got your flu shot today

## 2017-04-23 ENCOUNTER — Ambulatory Visit (INDEPENDENT_AMBULATORY_CARE_PROVIDER_SITE_OTHER): Payer: Self-pay | Admitting: Cardiology

## 2017-04-23 ENCOUNTER — Encounter: Payer: Self-pay | Admitting: Cardiology

## 2017-04-23 VITALS — BP 122/80 | HR 88 | Resp 10 | Ht 65.0 in | Wt 130.8 lb

## 2017-04-23 DIAGNOSIS — R002 Palpitations: Secondary | ICD-10-CM

## 2017-04-23 DIAGNOSIS — R0789 Other chest pain: Secondary | ICD-10-CM

## 2017-04-23 DIAGNOSIS — Z85820 Personal history of malignant melanoma of skin: Secondary | ICD-10-CM | POA: Insufficient documentation

## 2017-04-23 DIAGNOSIS — Z8249 Family history of ischemic heart disease and other diseases of the circulatory system: Secondary | ICD-10-CM

## 2017-04-23 DIAGNOSIS — R000 Tachycardia, unspecified: Secondary | ICD-10-CM

## 2017-04-23 HISTORY — DX: Personal history of malignant melanoma of skin: Z85.820

## 2017-04-23 NOTE — Patient Instructions (Addendum)
Medication Instructions:  Your physician recommends that you continue on your current medications as directed. Please refer to the Current Medication list given to you today.  1. Avoid all over-the-counter antihistamines except Claritin/Loratadine and Zyrtec/Cetrizine. 2. Avoid all combination including cold sinus allergies flu decongestant and sleep medications 3. You can use Robitussin DM Mucinex and Mucinex DM for cough. 4. can use Tylenol aspirin ibuprofen and naproxen but no combinations such as sleep or sinus.  Labwork: None   Testing/Procedures: Your physician has requested that you have an echocardiogram. Echocardiography is a painless test that uses sound waves to create images of your heart. It provides your doctor with information about the size and shape of your heart and how well your heart's chambers and valves are working. This procedure takes approximately one hour. There are no restrictions for this procedure.  Your physician has recommended that you wear an event monitor. Event monitors are medical devices that record the heart's electrical activity. Doctors most often [place] these monitors to diagnose arrhythmias. Arrhythmias are problems with the speed or rhythm of the heartbeat. The monitor is a small, portable device. You can wear one while you do your normal daily activities. This is usually used to diagnose what is causing palpitations/syncope (passing out).  Your physician has requested that you have a stress echocardiogram. For further information please visit [email or web address]. Please follow instruction sheet as given.    Follow-Up: Your physician recommends that you schedule a follow-up appointment in: 1 month   Any Other Special Instructions Will Be Listed Below (If Applicable).  Please note that any paperwork needing to be filled out by the provider will need to be addressed at the front desk prior to seeing the provider. Please note that any paperwork FMLA,  Disability or other documents regarding health condition is subject to a $25.00 charge that must be received prior to completion of paperwork in the form of a money order or check.    If you need a refill on your cardiac medications before your next appointment, please call your pharmacy.

## 2017-04-23 NOTE — Progress Notes (Signed)
Cardiology Consultation:    Date:  04/23/2017   ID:  [name], DOB [birth year], MRN [id number]  PCP:  Stephanie Mclean, MD  Cardiologist:  Stephanie Campus, MD   Referring MD: Stephanie Mclean, MD   Chief Complaint  Patient presents with  . Tachycardia  . Abnormal ECG    Past symptoms of arm squeezing  . Family history of Heart Attack    Sister had heart attack 2 weeks ago, and father had his first at age 42  I would like to be checked make sure my heart is okay  History of Present Illness:    [name] is a 41 y.o. female who is being seen today for the evaluation of Atypical chest pain at the request of [name], Stephanie Filler, MD. Patient is a 41 years old female with quite complex past medical history. 8 years ago she was diagnosed with melanoma. At that time she also was fine to have some lung mass concern was about metastatic melanoma however lung resection revealed presence of abscess. She is she's been admitted for however she started complaining of some atypical chest pain. Initially it started only with the right arm. Now from time to time she will get hurting in both of her arms with some radiation towards the neck. That can last up to 6 hours. There are no provoking or relieving factors, no shortness of breath, no sweating associated with the sensation. At the same time she can walk climb stairs and had no difficulties. She does not have tightness squeezing pressure burning in her chest. She does have multiple risk factors for coronary artery disease namely smoking which is still current, strong premature family history for coronary artery disease. Cholesterol is quite reasonable. She does not exercise on a regular basis. She does not snore at night. In terms of palpitations to describe her heart speeding up and sometimes having some skipped beats. There is no dizziness, passing out as assisted with this sensation.  Past Medical History:  Diagnosis Date  . Heart murmur   .  Hemorrhoids   . Hot thyroid nodule   . Lung abscess ([place]) 01/15/2011  . Melanoma ([place])    stage 3  . Menorrhagia   . Migraine headache   . Pituitary cyst ([place])   . Positive PPD   . Vaso vagal episode   . WEIGHT LOSS, ABNORMAL 06/24/2008   Qualifier: Diagnosis of  By: Stephanie Medal MD, [name]: Diagnosis of  By: Stephanie Kehr MD, [name]      Past Surgical History:  Procedure Laterality Date  . BRONCHOSCOPY    . LUNG REMOVAL, PARTIAL    . MEDIASTINOSCOPY    . TUBAL LIGATION    . wide excision     melanoma removed    Current Medications: No outpatient prescriptions have been marked as taking for the 04/23/17 encounter (Office Visit) with Park Liter, MD.     Allergies:   Patient has no known allergies.   Social History   Social History  . Marital status: Divorced    Spouse name: N/A  . Number of children: 2  . Years of education: N/A   Occupational History  .  [employer]   Social History Main Topics  . Smoking status: Current Every Day Smoker    Packs/day: 0.50    Years: 23.00    Types: Cigarettes  . Smokeless tobacco: Never Used  . Alcohol use No     Comment: none  .  Drug use: No     Comment: past  . Sexual activity: Yes    Birth control/ protection: Surgical   Other Topics Concern  . None   Social History Narrative  . None     Family History: The patient's family history includes Adrenal disorder in her sister; CAD in her sister; COPD in her mother; Coronary artery disease in her unknown relative; Diabetes in her father; Heart attack in her sister; Heart disease in her father, mother, sister, and sister; Heart murmur in her daughter; Lymphoma in her unknown relative; Pectus carinatum in her son; Supraventricular tachycardia in her sister. ROS:   Please see the history of present illness.    All 14 point review of systems negative except as described per history of present illness.  EKGs/Labs/Other Studies Reviewed:    The following studies were  reviewed today: EKG showing sinus tachycardia, nonspecific ST-T segment changes, incomplete right bundle branch block   EKG:  EKG is  ordered today.  The ekg ordered today demonstrates normal sinus rhythm, incomplete right bundle branch block, nonspecific ST-T segment changes  Recent Labs: 04/15/2017: ALT 11; BUN 7; Creatinine, Ser 0.72; Hemoglobin 13.0; Platelets 199.0; Potassium 3.9; Sodium 137; TSH 0.41  Recent Lipid Panel    Component Value Date/Time   CHOL 159 04/15/2017 1017   TRIG 71.0 04/15/2017 1017   HDL 52.10 04/15/2017 1017   CHOLHDL 3 04/15/2017 1017   VLDL 14.2 04/15/2017 1017   LDLCALC 92 04/15/2017 1017    Physical Exam:    VS:  BP 122/80   Pulse 88   Resp 10   Ht 5\' 5"  (1.651 m)   Wt 130 lb 12.8 oz (59.3 kg)   LMP 04/13/2017   BMI 21.77 kg/m     Wt Readings from Last 3 Encounters:  04/23/17 130 lb 12.8 oz (59.3 kg)  04/15/17 130 lb 6.4 oz (59.1 kg)  11/15/16 127 lb (57.6 kg)     GEN:  Well nourished, well developed in no acute distress HEENT: Normal NECK: No JVD; No carotid bruits LYMPHATICS: No lymphadenopathy CARDIAC: RRR, no murmurs, no rubs, no gallops RESPIRATORY:  Clear to auscultation without rales, wheezing or rhonchi Poor air entry to the right base. ABDOMEN: Soft, non-tender, non-distended MUSCULOSKELETAL:  No edema; No deformity  SKIN: Warm and dry NEUROLOGIC:  Alert and oriented x 3 PSYCHIATRIC:  Normal affect   ASSESSMENT:    1. Tachycardia   2. Palpitations   3. Atypical chest pain   4. Family history of coronary artery disease   5. History of melanoma    PLAN:    In order of problems listed above:  1. Tachycardia with palpitations: I will ask her to her event recorder 26 at work for arrhythmia with dealing with. 2. Atypical chest pain: I will ask her to have stressed is done we'll do stress echocardiogram I asked her to start taking one baby aspirin every single day. 3. Exertional shortness of breath: Most likely related to  the fact that she had lobectomy done secondary to abscess however I asked her to have echocardiogram to assess her left ventricular ejection fraction. 4. Dyslipidemia: Her cholesterol is only mildly elevated future recommendations intensive management will be based on the results of the test that we scheduling.   Medication Adjustments/Labs and Tests Ordered: Current medicines are reviewed at length with the patient today.  Concerns regarding medicines are outlined above.  Orders Placed This Encounter  Procedures  . Cardiac event monitor  .  EKG 12-Lead  . ECHOCARDIOGRAM COMPLETE  . ECHOCARDIOGRAM STRESS TEST   No orders of the defined types were placed in this encounter.   Signed, Park Liter, MD, [facility]. 04/23/2017 9:56 AM    [facility]

## 2017-04-29 ENCOUNTER — Ambulatory Visit: Payer: Self-pay

## 2017-04-29 NOTE — Progress Notes (Unsigned)
Patient in today for 30 day event monitor placement. I explained with the patient information on changing out devices, and how to replace monitor when needed. She is aware that she will take the monitor off and send it back on 11/14. She relayed back understanding and all questions were answered.

## 2017-05-16 ENCOUNTER — Other Ambulatory Visit (HOSPITAL_BASED_OUTPATIENT_CLINIC_OR_DEPARTMENT_OTHER): Payer: Self-pay

## 2017-05-23 ENCOUNTER — Ambulatory Visit (HOSPITAL_BASED_OUTPATIENT_CLINIC_OR_DEPARTMENT_OTHER)
Admission: RE | Admit: 2017-05-23 | Discharge: 2017-05-23 | Disposition: A | Discharge status: Home / Self Care | Payer: Self-pay | Source: Ambulatory Visit | Attending: Cardiology | Admitting: Cardiology

## 2017-05-23 DIAGNOSIS — R079 Chest pain, unspecified: Secondary | ICD-10-CM | POA: Insufficient documentation

## 2017-05-23 DIAGNOSIS — R000 Tachycardia, unspecified: Secondary | ICD-10-CM

## 2017-05-23 DIAGNOSIS — I5030 Unspecified diastolic (congestive) heart failure: Secondary | ICD-10-CM | POA: Insufficient documentation

## 2017-05-23 NOTE — Progress Notes (Signed)
  Echocardiogram 2D Echocardiogram has been performed.  [name] 05/23/2017, 10:19 AM

## 2017-05-23 NOTE — Progress Notes (Signed)
  Echocardiogram Echocardiogram Stress Test has been performed.  [name] 05/23/2017, 11:10 AM

## 2017-05-24 ENCOUNTER — Ambulatory Visit: Payer: Self-pay | Admitting: Cardiology

## 2017-05-24 ENCOUNTER — Ambulatory Visit (INDEPENDENT_AMBULATORY_CARE_PROVIDER_SITE_OTHER): Payer: Self-pay | Admitting: Cardiology

## 2017-05-24 ENCOUNTER — Encounter: Payer: Self-pay | Admitting: Cardiology

## 2017-05-24 VITALS — BP 112/64 | HR 88 | Resp 14 | Ht 65.0 in | Wt 130.0 lb

## 2017-05-24 DIAGNOSIS — F17200 Nicotine dependence, unspecified, uncomplicated: Secondary | ICD-10-CM

## 2017-05-24 DIAGNOSIS — R0789 Other chest pain: Secondary | ICD-10-CM

## 2017-05-24 DIAGNOSIS — IMO0001 Reserved for inherently not codable concepts without codable children: Secondary | ICD-10-CM

## 2017-05-24 DIAGNOSIS — I739 Peripheral vascular disease, unspecified: Secondary | ICD-10-CM

## 2017-05-24 DIAGNOSIS — R002 Palpitations: Secondary | ICD-10-CM

## 2017-05-24 HISTORY — DX: Peripheral vascular disease, unspecified: I73.9

## 2017-05-24 HISTORY — DX: Reserved for inherently not codable concepts without codable children: IMO0001

## 2017-05-24 HISTORY — DX: Nicotine dependence, unspecified, uncomplicated: F17.200

## 2017-05-24 NOTE — Progress Notes (Signed)
Cardiology Office Note:    Date:  05/24/2017   ID:  [name], DOB 03-12-1976, MRN [id number]  PCP:  Darreld Mclean, MD  Cardiologist:  Jenne Campus, MD    Referring MD: Darreld Mclean, MD   Chief Complaint  Patient presents with  . 1 month follow up  Doing fine  History of Present Illness:    [name] is a 41 y.o. female with palpitations as well as atypical chest pain.  Yesterday she had echocardiogram done which showed preserved left ventricular ejection fraction.  She also had stress test done which showed no evidence of ischemia she did quite well on the treadmill but while walking on the treadmill she develops pain in her calves especially right one.  I suspect she does have claudication.  She also tells me today that she walks she will developed pain in her legs especially right one.  She is still wearing event recorder and we waiting for results of it.  She still continued to smoke but strongly contemplating quitting  Past Medical History:  Diagnosis Date  . Heart murmur   . Hemorrhoids   . Hot thyroid nodule   . Lung abscess ([place]) 01/15/2011  . Melanoma ([place])    stage 3  . Menorrhagia   . Migraine headache   . Pituitary cyst ([place])   . Positive PPD   . Vaso vagal episode   . WEIGHT LOSS, ABNORMAL 06/24/2008   Qualifier: Diagnosis of  By: Tommy Medal MD, Neoma Laming: Diagnosis of  By: Walker Kehr MD, Patrick [place]      Past Surgical History:  Procedure Laterality Date  . BRONCHOSCOPY    . LUNG REMOVAL, PARTIAL    . MEDIASTINOSCOPY    . TUBAL LIGATION    . wide excision     melanoma removed    Current Medications: Current Meds  Medication Sig  . Aspirin-Acetaminophen-Caffeine (GOODY HEADACHE PO) Take by mouth as needed.  Marland Kitchen ibuprofen (ADVIL,MOTRIN) 200 MG tablet Take 200 mg by mouth every 6 (six) hours as needed.     Allergies:   Patient has no known allergies.   Social History   Socioeconomic History  . Marital status: Single    Spouse name: None    . Number of children: 2  . Years of education: None  . Highest education level: None  Social Needs  . Financial resource strain: None  . Food insecurity - worry: None  . Food insecurity - inability: None  . Transportation needs - medical: None  . Transportation needs - non-medical: None  Occupational History    Employer: [employer]  Tobacco Use  . Smoking status: Current Every Day Smoker    Packs/day: 0.50    Years: 23.00    Pack years: 11.50    Types: Cigarettes  . Smokeless tobacco: Never Used  Substance and Sexual Activity  . Alcohol use: No    Comment: none  . Drug use: No    Comment: past  . Sexual activity: Yes    Birth control/protection: Surgical  Other Topics Concern  . None  Social History Narrative  . None     Family History: The patient's family history includes Adrenal disorder in her sister; CAD in her sister; COPD in her mother; Coronary artery disease in her unknown relative; Diabetes in her father; Heart attack in her sister; Heart disease in her father, mother, sister, and sister; Heart murmur in her daughter; Lymphoma in her unknown relative; Pectus carinatum in  her son; Supraventricular tachycardia in her sister. ROS:   Please see the history of present illness.    All 14 point review of systems negative except as described per history of present illness  EKGs/Labs/Other Studies Reviewed:      Recent Labs: 04/15/2017: ALT 11; BUN 7; Creatinine, Ser 0.72; Hemoglobin 13.0; Platelets 199.0; Potassium 3.9; Sodium 137; TSH 0.41  Recent Lipid Panel    Component Value Date/Time   CHOL 159 04/15/2017 1017   TRIG 71.0 04/15/2017 1017   HDL 52.10 04/15/2017 1017   CHOLHDL 3 04/15/2017 1017   VLDL 14.2 04/15/2017 1017   LDLCALC 92 04/15/2017 1017    Physical Exam:    VS:  BP 112/64   Pulse 88   Resp 14   Ht 5\' 5"  (1.651 m)   Wt 130 lb (59 kg)   BMI 21.63 kg/m     Wt Readings from Last 3 Encounters:  05/24/17 130 lb (59 kg)  04/23/17 130 lb  12.8 oz (59.3 kg)  04/15/17 130 lb 6.4 oz (59.1 kg)     GEN:  Well nourished, well developed in no acute distress HEENT: Normal NECK: No JVD; No carotid bruits LYMPHATICS: No lymphadenopathy CARDIAC: RRR, no murmurs, no rubs, no gallops RESPIRATORY:  Clear to auscultation without rales, wheezing or rhonchi  ABDOMEN: Soft, non-tender, non-distended MUSCULOSKELETAL:  No edema; No deformity  SKIN: Warm and dry LOWER EXTREMITIES: no swelling NEUROLOGIC:  Alert and oriented x 3 PSYCHIATRIC:  Normal affect   ASSESSMENT:    1. Atypical chest pain   2. Claudication in peripheral vascular disease (HCC)   3. Smoking   4. Palpitations    PLAN:    In order of problems listed above:  1. Atypical chest pain: So far workup is negative.  Stress test was good in quality and show no evidence of ischemia.  I think the key right now will be modification of her risk factors for coronary artery disease.  I will ask her to keep taking aspirin in the metabolic I gave her some samples, they be debating also about starting statin since she does have some symptoms suggesting peripheral vascular disease and lower extremities will do ABIs and for ABIs will be abnormal he must be on statin. 2. Smoking: I gave her multiple points how she can quit she is determined to be able to do it. 3. Palpitations: Awaiting event recorder.  I see [name] back here in about 3 months but in the meantime will wait for results of event recorder as well as ABIs   Medication Adjustments/Labs and Tests Ordered: Current medicines are reviewed at length with the patient today.  Concerns regarding medicines are outlined above.  No orders of the defined types were placed in this encounter.  Medication changes: No orders of the defined types were placed in this encounter.   Signed, Park Liter, MD, [facility] 05/24/2017 9:34 AM    [place]

## 2017-05-24 NOTE — Patient Instructions (Signed)
Medication Instructions:  Your physician recommends that you continue on your current medications as directed. Please refer to the Current Medication list given to you today.  Labwork: None   Testing/Procedures: Your physician has requested that you have an ankle brachial index (ABI). During this test an ultrasound and blood pressure cuff are used to evaluate the arteries that supply the arms and legs with blood. Allow thirty minutes for this exam. There are no restrictions or special instructions.   Follow-Up: Your physician recommends that you schedule a follow-up appointment in: 3 months   Any Other Special Instructions Will Be Listed Below (If Applicable).  Please note that any paperwork needing to be filled out by the provider will need to be addressed at the front desk prior to seeing the provider. Please note that any paperwork FMLA, Disability or other documents regarding health condition is subject to a $25.00 charge that must be received prior to completion of paperwork in the form of a money order or check.     If you need a refill on your cardiac medications before your next appointment, please call your pharmacy.

## 2017-06-14 ENCOUNTER — Ambulatory Visit (HOSPITAL_BASED_OUTPATIENT_CLINIC_OR_DEPARTMENT_OTHER)
Admission: RE | Admit: 2017-06-14 | Discharge: 2017-06-14 | Disposition: A | Discharge status: Home / Self Care | Payer: Self-pay | Source: Ambulatory Visit | Attending: Family Medicine | Admitting: Family Medicine

## 2017-06-14 DIAGNOSIS — I739 Peripheral vascular disease, unspecified: Secondary | ICD-10-CM

## 2017-06-14 NOTE — Progress Notes (Signed)
Tech performed ABI, All values are within normal limits.

## 2017-09-03 ENCOUNTER — Emergency Department (HOSPITAL_BASED_OUTPATIENT_CLINIC_OR_DEPARTMENT_OTHER): Payer: Self-pay

## 2017-09-03 ENCOUNTER — Other Ambulatory Visit: Payer: Self-pay

## 2017-09-03 ENCOUNTER — Emergency Department (HOSPITAL_COMMUNITY)
Admission: EM | Admit: 2017-09-03 | Discharge: 2017-09-03 | Discharge status: Against Medical Advice | Payer: Self-pay | Attending: Emergency Medicine | Admitting: Emergency Medicine

## 2017-09-03 ENCOUNTER — Encounter (HOSPITAL_BASED_OUTPATIENT_CLINIC_OR_DEPARTMENT_OTHER): Payer: Self-pay | Admitting: *Deleted

## 2017-09-03 ENCOUNTER — Emergency Department (HOSPITAL_BASED_OUTPATIENT_CLINIC_OR_DEPARTMENT_OTHER)
Admission: EM | Admit: 2017-09-03 | Discharge: 2017-09-03 | Disposition: A | Discharge status: Home / Self Care | Payer: Self-pay | Attending: Emergency Medicine | Admitting: Emergency Medicine

## 2017-09-03 ENCOUNTER — Encounter (HOSPITAL_COMMUNITY): Payer: Self-pay

## 2017-09-03 ENCOUNTER — Encounter: Payer: Self-pay | Admitting: Obstetrics & Gynecology

## 2017-09-03 DIAGNOSIS — Z79899 Other long term (current) drug therapy: Secondary | ICD-10-CM | POA: Insufficient documentation

## 2017-09-03 DIAGNOSIS — F17210 Nicotine dependence, cigarettes, uncomplicated: Secondary | ICD-10-CM | POA: Insufficient documentation

## 2017-09-03 DIAGNOSIS — N83209 Unspecified ovarian cyst, unspecified side: Secondary | ICD-10-CM

## 2017-09-03 DIAGNOSIS — N83291 Other ovarian cyst, right side: Secondary | ICD-10-CM | POA: Insufficient documentation

## 2017-09-03 DIAGNOSIS — Z5321 Procedure and treatment not carried out due to patient leaving prior to being seen by health care provider: Secondary | ICD-10-CM | POA: Insufficient documentation

## 2017-09-03 LAB — COMPREHENSIVE METABOLIC PANEL
ALBUMIN: 4.3 g/dL (ref 3.5–5.0)
ALT: 15 U/L (ref 14–54)
AST: 18 U/L (ref 15–41)
Alkaline Phosphatase: 55 U/L (ref 38–126)
Anion gap: 8 (ref 5–15)
BUN: 9 mg/dL (ref 6–20)
CHLORIDE: 105 mmol/L (ref 101–111)
CO2: 23 mmol/L (ref 22–32)
Calcium: 9 mg/dL (ref 8.9–10.3)
Creatinine, Ser: 0.89 mg/dL (ref 0.44–1.00)
GFR calc Af Amer: >60 mL/min (ref >60)
GLUCOSE: 101 mg/dL — AB (ref 65–99)
POTASSIUM: 4 mmol/L (ref 3.5–5.1)
Sodium: 136 mmol/L (ref 135–145)
Total Bilirubin: 0.4 mg/dL (ref 0.3–1.2)
Total Protein: 7.5 g/dL (ref 6.5–8.1)

## 2017-09-03 LAB — LIPASE, BLOOD: LIPASE: 38 U/L (ref 11–51)

## 2017-09-03 LAB — WET PREP, GENITAL
Sperm: NONE SEEN
Trich, Wet Prep: NONE SEEN
Yeast Wet Prep HPF POC: NONE SEEN

## 2017-09-03 LAB — I-STAT BETA HCG BLOOD, ED (MC, WL, AP ONLY): I-stat hCG, quantitative: <5 m[IU]/mL (ref <5)

## 2017-09-03 LAB — CBC
HEMATOCRIT: 36.1 % (ref 36.0–46.0)
Hemoglobin: 12.1 g/dL (ref 12.0–15.0)
MCH: 28.8 pg (ref 26.0–34.0)
MCHC: 33.5 g/dL (ref 30.0–36.0)
MCV: 86 fL (ref 78.0–100.0)
Platelets: 194 10*3/uL (ref 150–400)
RBC: 4.2 MIL/uL (ref 3.87–5.11)
RDW: 14.1 % (ref 11.5–15.5)
WBC: 10.2 10*3/uL (ref 4.0–10.5)

## 2017-09-03 LAB — URINALYSIS, ROUTINE W REFLEX MICROSCOPIC
Bilirubin Urine: NEGATIVE
GLUCOSE, UA: NEGATIVE mg/dL
Hgb urine dipstick: NEGATIVE
KETONES UR: NEGATIVE mg/dL
Leukocytes, UA: NEGATIVE
Nitrite: NEGATIVE
PH: 5 (ref 5.0–8.0)
Protein, ur: NEGATIVE mg/dL
Specific Gravity, Urine: 1.015 (ref 1.005–1.030)

## 2017-09-03 MED ORDER — KETOROLAC TROMETHAMINE 30 MG/ML IJ SOLN
INTRAMUSCULAR | Status: AC
  Filled 2017-09-03: qty 1

## 2017-09-03 MED ORDER — HYDROCODONE-ACETAMINOPHEN 5-325 MG PO TABS: 1.0000 | ORAL_TABLET | Freq: Four times a day (QID) | ORAL | 0 refills | Status: DC | PRN

## 2017-09-03 MED ORDER — KETOROLAC TROMETHAMINE 30 MG/ML IJ SOLN
30.0000 mg | Freq: Once | INTRAMUSCULAR | Status: AC
  Administered 2017-09-03: 30 mg via INTRAVENOUS

## 2017-09-03 MED ORDER — IBUPROFEN 600 MG PO TABS: 600.0000 mg | ORAL_TABLET | Freq: Four times a day (QID) | ORAL | 0 refills | Status: AC | PRN

## 2017-09-03 MED ORDER — ONDANSETRON HCL 4 MG PO TABS: 4.0000 mg | ORAL_TABLET | Freq: Four times a day (QID) | ORAL | 0 refills | Status: DC

## 2017-09-03 MED ORDER — IOPAMIDOL (ISOVUE-300) INJECTION 61%
100.0000 mL | Freq: Once | INTRAVENOUS | Status: AC | PRN
  Administered 2017-09-03: 100 mL via INTRAVENOUS

## 2017-09-03 MED FILL — HYDROCODON-APAP 5-325: 5-325 | 1 days supply | Qty: 8 | Fill #0

## 2017-09-03 MED FILL — ONDANSETRON HCL 4 MG TABLET: 4 | 3 days supply | Qty: 12 | Fill #0

## 2017-09-03 MED FILL — IBUPROFEN 600 MG TABLET: 600 | 7 days supply | Qty: 30 | Fill #0

## 2017-09-03 NOTE — ED Triage Notes (Signed)
Pt reports sudden onset of right flank pain with radiation to her left lower abd, onset 1am, pt went to [place] er at 1am, waited until 4am and left. Pt states they took urine and blood there but she left before she got those results. Normal bm this am, pt states the pain waxes and wanes, right now is 6/10, but was 10/10 earlier and she was sweaty and nauseous with that pain.

## 2017-09-03 NOTE — ED Notes (Signed)
Pt wasn't in the room when Dr Roxanne Mins went in to see her

## 2017-09-03 NOTE — Discharge Instructions (Signed)
Medications: Ibuprofen, Norco, Zofran  Treatment: Take ibuprofen every 6 hours as prescribed.  For severe pain, take 1-2 Norco every 4-6 hours as needed.  Take Zofran every 6 hours as needed for nausea or vomiting.  Do not drink alcohol, drive, operate machinery or participate in any other potentially dangerous activities while taking opiate pain medication as it may make you sleepy. Do not take this medication with any other sedating medications, either prescription or over-the-counter. If you were prescribed Percocet or Vicodin, do not take these with acetaminophen (Tylenol) as it is already contained within these medications and overdose of Tylenol is dangerous.   This medication is an opiate (or narcotic) pain medication and can be habit forming.  Use it as little as possible to achieve adequate pain control.  Do not use or use it with extreme caution if you have a history of opiate abuse or dependence. This medication is intended for your use only - do not give any to anyone else and keep it in a secure place where nobody else, especially children, have access to it. It will also cause or worsen constipation, so you may want to consider taking an over-the-counter stool softener while you are taking this medication.  Follow-up: Please follow-up with your OB/GYN for further evaluation and treatment.  Please return to the emergency department if you develop any new or worsening symptoms.

## 2017-09-03 NOTE — ED Notes (Signed)
Pt complains of acute right lower quad pain tonight, no vomiting or diarrhea, pt says the pain has subsided some since being here

## 2017-09-03 NOTE — ED Provider Notes (Signed)
[facility] EMERGENCY DEPARTMENT Provider Note   CSN: [id number] Arrival date & time: 09/03/17  8841     History   Chief Complaint Chief Complaint  Patient presents with  . Flank Pain    HPI [name] is a 42 y.o. female with history of adenomyosis who presents with sudden onset right lower quadrant pain that began around midnight last night.  Patient reports the pain is been waxing and waning.  It radiates to her low back.  Describes it as a severe, sharp cramp.  She has had associated nausea and sweats.  No vomiting or diarrhea.  She had a normal bowel movement this morning.  She reported a pain prior to urinating, however denies dysuria or hematuria.  Patient reports going to [facility] overnight, but left due to wait.  She had urine and labs done.  She denies taking any medications at home for symptoms.  She has history of ovarian cysts in the past.  Denies any abdominal surgeries except tubal ligation.  She denies any recent abnormal vaginal bleeding or discharge.  Patient states she does get spotting due to her adenomyosis, however it is not currently having.  HPI  Past Medical History:  Diagnosis Date  . Heart murmur   . Hemorrhoids   . Hot thyroid nodule   . Lung abscess ([place]) 01/15/2011  . Melanoma ([place])    stage 3  . Menorrhagia   . Migraine headache   . Pituitary cyst ([place])   . Positive PPD   . Vaso vagal episode   . WEIGHT LOSS, ABNORMAL 06/24/2008   Qualifier: Diagnosis of  By: Tommy Medal MD, Neoma Laming: Diagnosis of  By: Walker Kehr MD, [facility]      Patient Active Problem List   Diagnosis Date Noted  . Claudication in peripheral vascular disease ([place]) 05/24/2017  . Smoking 05/24/2017  . History of melanoma 04/23/2017  . Episodic tension type headache 07/21/2013  . Paroxysmal hypertension 07/21/2013  . Back pain 02/13/2012  . Headache(784.0) 01/25/2012  . Endocrine disorder 01/25/2012  . Elevated blood pressure reading without diagnosis of  hypertension 01/25/2012  . Family history of coronary artery disease 12/05/2010  . Dizziness 09/08/2010  . Irregular menses 08/24/2010  . FATIGUE 05/18/2010  . PALPITATIONS 05/18/2010  . MALIGNANT MELANOMA SKIN TRUNK EXCEPT SCROTUM 09/22/2008  . Atypical chest pain 09/22/2008  . DYSPLASTIC NEVUS, BACK 06/24/2008  . Solitary pulmonary nodule 06/24/2008  . 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 Tobacco abuse counseling 06/23/2008    Past Surgical History:  Procedure Laterality Date  . BRONCHOSCOPY    . LUNG REMOVAL, PARTIAL    . MEDIASTINOSCOPY    . TUBAL LIGATION    . wide excision     melanoma removed    OB History    Gravida Para Term Preterm AB Living   2 2 1 1   2    SAB TAB Ectopic Multiple Live Births                   Home Medications    Prior to Admission medications   Medication Sig Start Date End Date Taking? Authorizing Provider  Aspirin-Acetaminophen-Caffeine (GOODY HEADACHE PO) Take by mouth as needed.    [provider]  HYDROcodone-acetaminophen (NORCO/VICODIN) 5-325 MG tablet Take 1-2 tablets by mouth every 6 (six) hours as needed for severe pain. 09/03/17   Kailia Starry, Bea Graff, PA-C  ibuprofen (ADVIL,MOTRIN) 600 MG tablet Take 1 tablet (600 mg total) by mouth every  6 (six) hours as needed. 09/03/17   Darrion Macaulay, Bea Graff, PA-C  ondansetron (ZOFRAN) 4 MG tablet Take 1 tablet (4 mg total) by mouth every 6 (six) hours. 09/03/17   [name], PA-C    Family History Family History  Problem Relation Age of Onset  . COPD Mother   . Heart disease Mother   . Heart disease Father   . Diabetes Father   . Heart disease Sister   . Heart attack Sister   . CAD Sister   . Adrenal disorder Sister   . Heart disease Sister   . Supraventricular tachycardia Sister   . Pectus carinatum Son   . Heart murmur Daughter   . Coronary artery disease Unknown   . Lymphoma Unknown     Social History Social History   Tobacco Use  . Smoking status: Current Every Day Smoker      Packs/day: 0.50    Years: 23.00    Pack years: 11.50    Types: Cigarettes  . Smokeless tobacco: Never Used  Substance Use Topics  . Alcohol use: No    Comment: none  . Drug use: No    Comment: past     Allergies   Patient has no known allergies.   Review of Systems Review of Systems  Constitutional: Positive for diaphoresis. Negative for chills and fever.  HENT: Negative for facial swelling and sore throat.   Respiratory: Negative for shortness of breath.   Cardiovascular: Negative for chest pain.  Gastrointestinal: Positive for abdominal pain and nausea. Negative for vomiting.  Genitourinary: Negative for dysuria, vaginal bleeding and vaginal discharge.  Musculoskeletal: Negative for back pain.  Skin: Negative for rash and wound.  Neurological: Negative for headaches.  Psychiatric/Behavioral: The patient is not nervous/anxious.      Physical Exam Updated Vital Signs BP (!) 140/98 (BP Location: Right Arm)   Pulse 96   Temp 98 F (36.7 C) (Oral)   Resp 18   Ht 5\' 5"  (1.651 m)   Wt 58.5 kg (129 lb)   LMP 08/21/2017   SpO2 96%   BMI 21.47 kg/m   Physical Exam  Constitutional: She appears well-developed and well-nourished. No distress.  HENT:  Head: Normocephalic and atraumatic.  Mouth/Throat: Oropharynx is clear and moist. No oropharyngeal exudate.  Eyes: Conjunctivae are normal. Pupils are equal, round, and reactive to light. Right eye exhibits no discharge. Left eye exhibits no discharge. No scleral icterus.  Neck: Normal range of motion. Neck supple. No thyromegaly present.  Cardiovascular: Normal rate, regular rhythm, normal heart sounds and intact distal pulses. Exam reveals no gallop and no friction rub.  No murmur heard. Pulmonary/Chest: Effort normal and breath sounds normal. No stridor. No respiratory distress. She has no wheezes. She has no rales.  Abdominal: Soft. Bowel sounds are normal. She exhibits no distension. There is tenderness in the right  lower quadrant and suprapubic area. There is tenderness at McBurney's point. There is no rebound, no guarding and no CVA tenderness.  Genitourinary: Uterus normal. Cervix exhibits discharge (clear with scant blood). Cervix exhibits no motion tenderness. Right adnexum displays no tenderness. Left adnexum displays no tenderness. There is bleeding (scant) in the vagina. Vaginal discharge (clear) found.  Genitourinary Comments: Chaperone present; bilateral adnexal discomfort, however no significant pain or tenderness  Musculoskeletal: She exhibits no edema.  Lymphadenopathy:    She has no cervical adenopathy.  Neurological: She is alert. Coordination normal.  Skin: Skin is warm and dry. No rash noted. She is  not diaphoretic. No pallor.  Psychiatric: She has a normal mood and affect.  Nursing note and vitals reviewed.    ED Treatments / Results  Labs (all labs ordered are listed, but only abnormal results are displayed) Labs Reviewed  WET PREP, GENITAL - Abnormal; Notable for the following components:      Result Value   Clue Cells Wet Prep HPF POC PRESENT (*)    WBC, Wet Prep HPF POC MANY (*)    All other components within normal limits  GC/CHLAMYDIA PROBE AMP ([place]) NOT AT [facility]    EKG  EKG Interpretation None       Radiology Ct Abdomen Pelvis W Contrast  Result Date: 09/03/2017 CLINICAL DATA:  Abdominal pain, right flank pain EXAM: CT ABDOMEN AND PELVIS WITH CONTRAST TECHNIQUE: Multidetector CT imaging of the abdomen and pelvis was performed using the standard protocol following bolus administration of intravenous contrast. CONTRAST:  130mL ISOVUE-300 IOPAMIDOL (ISOVUE-300) INJECTION 61% COMPARISON:  CT abdomen pelvis of 12/19/2010 FINDINGS: Lower chest: Lung bases are clear. The heart is within normal limits in size. Hepatobiliary: The liver enhances with no focal abnormality and no ductal dilatation is seen. No calcified gallstones are noted. Pancreas: Pancreas is normal in  size in the pancreatic duct is not dilated. Spleen: The spleen is unremarkable. Adrenals/Urinary Tract: The adrenal glands appear stable. The kidneys enhance with no calculus or mass and no hydronephrosis is seen. The pelvocaliceal systems are unremarkable. The ureters are normal in caliber. The urinary bladder is moderately urine distended with no abnormality noted. Stomach/Bowel: The stomach is moderately distended with oral contrast media with no abnormality evident. The small bowel is not distended. The colon is largely decompressed. The terminal ileum is unremarkable. The appendix extends caudally toward the tip of the right lobe of liver but no inflammatory process is seen and there is some air within the nondilated appendix. Vascular/Lymphatic: The abdominal aorta is normal in caliber with moderate abdominal aortic atherosclerosis present. No adenopathy is seen. Reproductive: The uterus is normal in size and appears somewhat inhomogeneous. Small uterine fibroids cannot be excluded. In addition there are bilateral ovarian follicles present, and there is some free fluid in the pelvis possibly due to a recently ruptured ovarian cyst. Other: None. Musculoskeletal: The lumbar vertebrae are normal alignment with normal intervertebral disc spaces. The SI joints appear corticated. IMPRESSION: 1. The appendix appears normal with no evidence of acute appendicitis. 2. Bilateral ovarian follicles with some free fluid in the pelvis which may be due to a recently ruptured ovarian cyst. 3. Moderate abdominal aortic atherosclerosis. Electronically Signed   By: Ivar Drape M.D.   On: 09/03/2017 12:19    Procedures Procedures (including critical care time)  Medications Ordered in ED Medications  iopamidol (ISOVUE-300) 61 % injection 100 mL (100 mLs Intravenous Contrast Given 09/03/17 1145)  ketorolac (TORADOL) 30 MG/ML injection 30 mg (30 mg Intravenous Given 09/03/17 1229)     Initial Impression / Assessment and  Plan / ED Course  I have reviewed the triage vital signs and the nursing notes.  Pertinent labs & imaging results that were available during my care of the patient were reviewed by me and considered in my medical decision making (see chart for details).     Patient with suspected ruptured ovarian cyst seen on CT scan.  Labs unremarkable, which were drawn around 3 AM this morning at [facility].  Wet prep shows clue cells, however no change in vaginal discharge, so will defer treatment  at this time.  GC/chlamydia sent and pending.  Will discharge home with pain control including ibuprofen and very short course of Norco.  I reviewed the [place] narcotic database and found no discrepancies.  We will also discharge with Zofran.  Follow-up to OB/GYN for further evaluation and treatment.  Return precautions discussed.  Patient understands and agrees with plan.  Patient vitals stable throughout ED course and condition.  Patient also evaluated by Dr. Ashok Cordia who guided the patient's management and agrees with plan.  Final Clinical Impressions(s) / ED Diagnoses   Final diagnoses:  Ruptured ovarian cyst    ED Discharge Orders        Ordered    HYDROcodone-acetaminophen (NORCO/VICODIN) 5-325 MG tablet  Every 6 hours PRN     09/03/17 1245    ibuprofen (ADVIL,MOTRIN) 600 MG tablet  Every 6 hours PRN     09/03/17 1245    ondansetron (ZOFRAN) 4 MG tablet  Every 6 hours     09/03/17 1245       [name], PA-C 09/03/17 1358    Lajean Saver, MD 09/04/17 1227

## 2017-09-03 NOTE — ED Notes (Addendum)
Pt awoke to a sharp pain in the RLQ and in the lower right back. She describes the pain as radiating across the lower abdomen. Since being in the ED, the pain has subsided, but not completely. Reports nausea no emesis.

## 2017-09-04 LAB — GC/CHLAMYDIA PROBE AMP (~~LOC~~) NOT AT ARMC
Chlamydia: NEGATIVE
Neisseria Gonorrhea: NEGATIVE

## 2017-09-06 ENCOUNTER — Telehealth: Payer: Self-pay | Admitting: *Deleted

## 2017-09-06 ENCOUNTER — Encounter: Payer: Self-pay | Admitting: Obstetrics & Gynecology

## 2017-09-06 ENCOUNTER — Ambulatory Visit: Payer: Self-pay | Admitting: Obstetrics & Gynecology

## 2017-09-06 VITALS — BP 140/84 | HR 95 | Wt 128.3 lb

## 2017-09-06 DIAGNOSIS — Z1151 Encounter for screening for human papillomavirus (HPV): Secondary | ICD-10-CM

## 2017-09-06 DIAGNOSIS — Z124 Encounter for screening for malignant neoplasm of cervix: Secondary | ICD-10-CM

## 2017-09-06 DIAGNOSIS — Z01419 Encounter for gynecological examination (general) (routine) without abnormal findings: Secondary | ICD-10-CM

## 2017-09-06 NOTE — Progress Notes (Signed)
Subjective:    [name] is a 42 y.o. married P2 (21 and 49 yo kids, no grands) female who presents for an annual exam. I diagnosed her with adenomyosis about 4 years based on ultrasound. She still has irregular bleeding. Her periods heavy and painful and long. They last about 4-5 heavy but spotting for another week or two. She had a BTL 21 years ago. She was treated with POPs for about 6 months when they stopped working. She was in the ED recently and had a CT that showed a ruptured ovarian cyst. Her uterus was normal size.  The patient is sexually active. GYN screening history: last pap: was normal. The patient wears seatbelts: yes. The patient participates in regular exercise: yes. Has the patient ever been transfused or tattooed?: yes. The patient reports that there is not domestic violence in her life.   Menstrual History: OB History    Gravida Para Term Preterm AB Living   2 2 1 1   2    SAB TAB Ectopic Multiple Live Births           2      Menarche age: 23 Patient's last menstrual period was 08/21/2017.    The following portions of the patient's history were reviewed and updated as appropriate: allergies, current medications, past family history, past medical history, past social history, past surgical history and problem list.  Review of Systems Pertinent items are noted in HPI.   FH- no breast/gyn/colon cancer Monogamous for 7 years, this is with her ex-husband They own a pool company, [employer]  Mammogram due   Objective:    BP 140/84 (BP Location: Left Arm, Patient Position: Sitting, Cuff Size: Normal)   Pulse 95   Wt 128 lb 4.8 oz (58.2 kg)   LMP 08/21/2017   BMI 21.35 kg/m   General Appearance:    Alert, cooperative, no distress, appears stated age  Head:    Normocephalic, without obvious abnormality, atraumatic  Eyes:    PERRL, conjunctiva/corneas clear, EOM's intact, fundi    benign, both eyes  Ears:    Normal TM's and external ear canals, both ears  Nose:   Nares  normal, septum midline, mucosa normal, no drainage    or sinus tenderness  Throat:   Lips, mucosa, and tongue normal; teeth and gums normal  Neck:   Supple, symmetrical, trachea midline, no adenopathy;    thyroid:  no enlargement/tenderness/nodules; no carotid   bruit or JVD  Back:     Symmetric, no curvature, ROM normal, no CVA tenderness  Lungs:     Clear to auscultation bilaterally, respirations unlabored  Chest Wall:    No tenderness or deformity   Heart:    Regular rate and rhythm, S1 and S2 normal, no murmur, rub   or gallop  Breast Exam:    No tenderness, masses, or nipple abnormality  Abdomen:     Soft, non-tender, bowel sounds active all four quadrants,    no masses, no organomegaly  Genitalia:    Normal female without lesion, discharge or tenderness     Extremities:   Extremities normal, atraumatic, no cyanosis or edema  Pulses:   2+ and symmetric all extremities  Skin:   Skin color, texture, turgor normal, no rashes or lesions  Lymph nodes:   Cervical, supraclavicular, and axillary nodes normal  Neurologic:   CNII-XII intact, normal strength, sensation and reflexes    throughout  .    Assessment:    Healthy female exam.  Plan:     Mammogram. Thin prep Pap smear.   Schedule d&c/ endometrial ablation

## 2017-09-06 NOTE — Telephone Encounter (Signed)
Called patient with mammogram appt. She stated she will reschedule the appointment so she can have the test at [place].

## 2017-09-09 ENCOUNTER — Encounter (HOSPITAL_COMMUNITY): Payer: Self-pay

## 2017-09-10 LAB — CYTOLOGY - PAP
DIAGNOSIS: NEGATIVE
HPV (WINDOPATH): NOT DETECTED

## 2017-09-13 ENCOUNTER — Ambulatory Visit: Payer: Self-pay

## 2017-10-03 ENCOUNTER — Encounter (HOSPITAL_COMMUNITY): Payer: Self-pay

## 2017-10-09 ENCOUNTER — Encounter (HOSPITAL_COMMUNITY): Payer: Self-pay | Admitting: *Deleted

## 2017-10-10 ENCOUNTER — Encounter (HOSPITAL_COMMUNITY): Payer: Self-pay | Admitting: *Deleted

## 2017-10-14 ENCOUNTER — Other Ambulatory Visit: Payer: Self-pay

## 2017-10-14 ENCOUNTER — Encounter (HOSPITAL_COMMUNITY): Payer: Self-pay

## 2017-10-14 ENCOUNTER — Encounter (HOSPITAL_COMMUNITY)
Admission: RE | Admit: 2017-10-14 | Discharge: 2017-10-14 | Disposition: A | Discharge status: Home / Self Care | Payer: Self-pay | Source: Ambulatory Visit | Attending: Obstetrics & Gynecology | Admitting: Obstetrics & Gynecology

## 2017-10-14 DIAGNOSIS — Z01812 Encounter for preprocedural laboratory examination: Secondary | ICD-10-CM | POA: Insufficient documentation

## 2017-10-14 HISTORY — DX: Anemia, unspecified: D64.9

## 2017-10-14 LAB — CBC
HCT: 35.1 % — ABNORMAL LOW (ref 36.0–46.0)
HEMOGLOBIN: 11.9 g/dL — AB (ref 12.0–15.0)
MCH: 29.1 pg (ref 26.0–34.0)
MCHC: 33.9 g/dL (ref 30.0–36.0)
MCV: 85.8 fL (ref 78.0–100.0)
Platelets: 201 10*3/uL (ref 150–400)
RBC: 4.09 MIL/uL (ref 3.87–5.11)
RDW: 14.2 % (ref 11.5–15.5)
WBC: 7.8 10*3/uL (ref 4.0–10.5)

## 2017-10-14 NOTE — Patient Instructions (Addendum)
Your procedure is scheduled on:  Tuesday, April 9  Enter through the Main Entrance of [facility] at: 10 am  Pick up the phone at the desk and dial [phone].  Call this number if you have problems the morning of surgery: [phone].  Remember: Do NOT eat or Do NOT drink clear liquids (including water) after midnight Monday  Take these medicines the morning of surgery with a SIP OF WATER:   None  Do Not smoke 24 hours prior to surgery.  Stop herbal medications, vitamin supplements and ibuprofen 5 days prior to procedure.  Do NOT wear jewelry (body piercing), metal hair clips/bobby pins, make-up, or nail polish. Do NOT wear lotions, powders, or perfumes.  You may wear deoderant. Do NOT shave for 48 hours prior to surgery. Do NOT bring valuables to the hospital.  Have a responsible adult drive you home and stay with you for 24 hours after your procedure.  Home with husband [name] cell [phone].

## 2017-10-21 ENCOUNTER — Encounter (HOSPITAL_COMMUNITY): Payer: Self-pay

## 2017-10-21 NOTE — Anesthesia Preprocedure Evaluation (Signed)
Anesthesia Evaluation  Patient identified by MRN, date of birth, ID band Patient awake    Reviewed: Allergy & Precautions, H&P , Patient's Chart, lab work & pertinent test results, reviewed documented beta blocker date and time   Airway Mallampati: II  TM Distance: >3 FB Neck ROM: full    Dental no notable dental hx.    Pulmonary Current Smoker,    Pulmonary exam normal breath sounds clear to auscultation       Cardiovascular  Rhythm:regular Rate:Normal     Neuro/Psych    GI/Hepatic   Endo/Other    Renal/GU      Musculoskeletal   Abdominal   Peds  Hematology   Anesthesia Other Findings   Reproductive/Obstetrics                             Anesthesia Physical Anesthesia Plan  ASA: II  Anesthesia Plan: General   Post-op Pain Management:    Induction: Intravenous  PONV Risk Score and Plan: 3 and Dexamethasone, Ondansetron, Treatment may vary due to age or medical condition, Scopolamine patch - Pre-op and Midazolam  Airway Management Planned: LMA  Additional Equipment:   Intra-op Plan:   Post-operative Plan:   Informed Consent: I have reviewed the patients History and Physical, chart, labs and discussed the procedure including the risks, benefits and alternatives for the proposed anesthesia with the patient or authorized representative who has indicated his/her understanding and acceptance.   Dental Advisory Given  Plan Discussed with: CRNA and Surgeon  Anesthesia Plan Comments: ( )        Anesthesia Quick Evaluation

## 2017-10-22 ENCOUNTER — Encounter (HOSPITAL_COMMUNITY): Payer: Self-pay

## 2017-10-22 ENCOUNTER — Ambulatory Visit (HOSPITAL_COMMUNITY): Payer: Self-pay | Admitting: Anesthesiology

## 2017-10-22 ENCOUNTER — Encounter (HOSPITAL_COMMUNITY)
Admission: AD | Disposition: A | Discharge status: Home / Self Care | Payer: Self-pay | Source: Ambulatory Visit | Attending: Obstetrics & Gynecology

## 2017-10-22 ENCOUNTER — Other Ambulatory Visit: Payer: Self-pay

## 2017-10-22 ENCOUNTER — Ambulatory Visit (HOSPITAL_COMMUNITY)
Admission: AD | Admit: 2017-10-22 | Discharge: 2017-10-22 | Disposition: A | Discharge status: Home / Self Care | Payer: Self-pay | Source: Ambulatory Visit | Attending: Obstetrics & Gynecology | Admitting: Obstetrics & Gynecology

## 2017-10-22 DIAGNOSIS — N938 Other specified abnormal uterine and vaginal bleeding: Secondary | ICD-10-CM

## 2017-10-22 DIAGNOSIS — N800 Endometriosis of uterus: Secondary | ICD-10-CM

## 2017-10-22 DIAGNOSIS — F17210 Nicotine dependence, cigarettes, uncomplicated: Secondary | ICD-10-CM | POA: Insufficient documentation

## 2017-10-22 HISTORY — PX: DILATION AND CURETTAGE OF UTERUS: SHX78

## 2017-10-22 LAB — PREGNANCY, URINE: PREG TEST UR: NEGATIVE

## 2017-10-22 SURGERY — DILATION AND CURETTAGE
Anesthesia: General

## 2017-10-22 MED ORDER — OXYCODONE HCL 5 MG PO TABS
ORAL_TABLET | ORAL | Status: AC
  Filled 2017-10-22: qty 1

## 2017-10-22 MED ORDER — FENTANYL CITRATE (PF) 100 MCG/2ML IJ SOLN
INTRAMUSCULAR | Status: DC | PRN
  Administered 2017-10-22 (×2): 50 ug via INTRAVENOUS

## 2017-10-22 MED ORDER — LIDOCAINE HCL (CARDIAC) 20 MG/ML IV SOLN
INTRAVENOUS | Status: AC
  Filled 2017-10-22: qty 5

## 2017-10-22 MED ORDER — SCOPOLAMINE 1 MG/3DAYS TD PT72
1.0000 | MEDICATED_PATCH | Freq: Once | TRANSDERMAL | Status: DC
  Administered 2017-10-22: 1.5 mg via TRANSDERMAL

## 2017-10-22 MED ORDER — FENTANYL CITRATE (PF) 100 MCG/2ML IJ SOLN
INTRAMUSCULAR | Status: AC
  Filled 2017-10-22: qty 2

## 2017-10-22 MED ORDER — MIDAZOLAM HCL 2 MG/2ML IJ SOLN
INTRAMUSCULAR | Status: DC | PRN
  Administered 2017-10-22 (×2): 1 mg via INTRAVENOUS

## 2017-10-22 MED ORDER — SUGAMMADEX SODIUM 500 MG/5ML IV SOLN
INTRAVENOUS | Status: AC
  Filled 2017-10-22: qty 5

## 2017-10-22 MED ORDER — KETOROLAC TROMETHAMINE 30 MG/ML IJ SOLN
INTRAMUSCULAR | Status: AC
  Filled 2017-10-22: qty 1

## 2017-10-22 MED ORDER — ONDANSETRON HCL 4 MG/2ML IJ SOLN
INTRAMUSCULAR | Status: DC | PRN
  Administered 2017-10-22: 4 mg via INTRAVENOUS

## 2017-10-22 MED ORDER — MIDAZOLAM HCL 2 MG/2ML IJ SOLN
INTRAMUSCULAR | Status: AC
  Filled 2017-10-22: qty 2

## 2017-10-22 MED ORDER — SCOPOLAMINE 1 MG/3DAYS TD PT72
MEDICATED_PATCH | TRANSDERMAL | Status: AC
  Administered 2017-10-22: 1.5 mg via TRANSDERMAL
  Filled 2017-10-22: qty 1

## 2017-10-22 MED ORDER — ONDANSETRON HCL 4 MG/2ML IJ SOLN
INTRAMUSCULAR | Status: AC
  Filled 2017-10-22: qty 2

## 2017-10-22 MED ORDER — FENTANYL CITRATE (PF) 100 MCG/2ML IJ SOLN
25.0000 ug | INTRAMUSCULAR | Status: DC | PRN
  Administered 2017-10-22: 50 ug via INTRAVENOUS

## 2017-10-22 MED ORDER — DEXAMETHASONE SODIUM PHOSPHATE 4 MG/ML IJ SOLN
INTRAMUSCULAR | Status: DC | PRN
  Administered 2017-10-22: 4 mg via INTRAVENOUS

## 2017-10-22 MED ORDER — ACETAMINOPHEN 160 MG/5ML PO SOLN
ORAL | Status: AC
  Administered 2017-10-22: 975 mg via ORAL
  Filled 2017-10-22: qty 40.6

## 2017-10-22 MED ORDER — LACTATED RINGERS IV SOLN
INTRAVENOUS | Status: DC
  Administered 2017-10-22 (×2): via INTRAVENOUS

## 2017-10-22 MED ORDER — OXYCODONE HCL 5 MG/5ML PO SOLN: 5.0000 mg | Freq: Once | ORAL | Status: AC | PRN

## 2017-10-22 MED ORDER — DEXAMETHASONE SODIUM PHOSPHATE 4 MG/ML IJ SOLN
INTRAMUSCULAR | Status: AC
  Filled 2017-10-22: qty 1

## 2017-10-22 MED ORDER — PROPOFOL 10 MG/ML IV BOLUS
INTRAVENOUS | Status: AC
  Filled 2017-10-22: qty 20

## 2017-10-22 MED ORDER — GLYCOPYRROLATE 0.2 MG/ML IJ SOLN
INTRAMUSCULAR | Status: DC | PRN
  Administered 2017-10-22: 0.1 mg via INTRAVENOUS

## 2017-10-22 MED ORDER — OXYCODONE-ACETAMINOPHEN 5-325 MG PO TABS: 1.0000 | ORAL_TABLET | Freq: Four times a day (QID) | ORAL | 0 refills | Status: DC | PRN

## 2017-10-22 MED ORDER — LIDOCAINE HCL (CARDIAC) 20 MG/ML IV SOLN
INTRAVENOUS | Status: DC | PRN
  Administered 2017-10-22: 80 mg via INTRAVENOUS

## 2017-10-22 MED ORDER — PROPOFOL 10 MG/ML IV BOLUS
INTRAVENOUS | Status: DC | PRN
  Administered 2017-10-22: 150 mg via INTRAVENOUS

## 2017-10-22 MED ORDER — ACETAMINOPHEN 160 MG/5ML PO SOLN
975.0000 mg | Freq: Once | ORAL | Status: AC
  Administered 2017-10-22: 975 mg via ORAL

## 2017-10-22 MED ORDER — OXYCODONE HCL 5 MG PO TABS
5.0000 mg | ORAL_TABLET | Freq: Once | ORAL | Status: AC | PRN
  Administered 2017-10-22: 5 mg via ORAL

## 2017-10-22 MED ORDER — BUPIVACAINE HCL 0.5 % IJ SOLN
INTRAMUSCULAR | Status: DC | PRN
  Administered 2017-10-22: 30 mL

## 2017-10-22 MED ORDER — KETOROLAC TROMETHAMINE 30 MG/ML IJ SOLN
INTRAMUSCULAR | Status: DC | PRN
  Administered 2017-10-22: 30 mg via INTRAVENOUS

## 2017-10-22 SURGICAL SUPPLY — 15 items
CNTNR SPEC C3OZ STD GRAD LEK (MISCELLANEOUS) ×1 IMPLANT
CONT SPEC 3OZ W/LID STRL (MISCELLANEOUS) ×3
DILATOR CANAL MILEX (MISCELLANEOUS) IMPLANT
GLOVE BIO SURGEON STRL SZ 6.5 (GLOVE) ×2 IMPLANT
GLOVE BIO SURGEONS STRL SZ 6.5 (GLOVE) ×1
GLOVE BIOGEL PI IND STRL 7.0 (GLOVE) ×1 IMPLANT
GLOVE BIOGEL PI INDICATOR 7.0 (GLOVE) ×2
GOWN STRL REUS W/TWL LRG LVL3 (GOWN DISPOSABLE) ×6 IMPLANT
HANDPIECE ABLA MINERVA ENDO (MISCELLANEOUS) ×2 IMPLANT
NDL SPNL 18GX3.5 QUINCKE PK (NEEDLE) ×1 IMPLANT
NEEDLE SPNL 18GX3.5 QUINCKE PK (NEEDLE) ×3 IMPLANT
PACK VAGINAL MINOR WOMEN LF (CUSTOM PROCEDURE TRAY) ×3 IMPLANT
PAD OB MATERNITY 4.3X12.25 (PERSONAL CARE ITEMS) ×3 IMPLANT
PAD PREP 24X48 CUFFED NSTRL (MISCELLANEOUS) ×3 IMPLANT
TOWEL OR 17X24 6PK STRL BLUE (TOWEL DISPOSABLE) ×6 IMPLANT

## 2017-10-22 NOTE — H&P (Signed)
[name] is a 42 y.o. married P2 (21 and 3 yo kids, no grands) female who presents for a dilation and curettage and endometrial ablation. I diagnosed her with adenomyosis about 4 years based on ultrasound. She still has irregular bleeding. Her periods heavy and painful and long. They last about 4-5 heavy but spotting for another week or two. She had a BTL 21 years ago. She was treated with POPs for about 6 months when they stopped working. She was in the ED recently and had a CT that showed a ruptured ovarian cyst. Her uterus was normal size     Patient's last menstrual period was 10/09/2017 (exact date).    Past Medical History:  Diagnosis Date  . Anemia   . Heart murmur    no problems  . Hemorrhoids   . Hot thyroid nodule 2009   needle bx negative  . Lung abscess ([place]) 01/15/2011  . Melanoma ([place])    stage 3 - mid back  . Menorrhagia   . Migraine headache   . Pituitary cyst ([place])   . 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 SVD (spontaneous vaginal delivery)    x 2  . Vaso vagal episode   . WEIGHT LOSS, ABNORMAL 06/24/2008   Qualifier: Diagnosis of  By: Tommy Medal MD, Neoma Laming: Diagnosis of  By: Walker Kehr MD, Patrick Jupiter      Past Surgical History:  Procedure Laterality Date  . BREAST SURGERY     augmentation  . BRONCHOSCOPY    . COLONOSCOPY    . LUNG REMOVAL, PARTIAL Left 2010   upper left lobe  . MEDIASTINOSCOPY    . TUBAL LIGATION    . UPPER GI ENDOSCOPY    . wide excision     melanoma removed - mid back  . WISDOM TOOTH EXTRACTION      Family History  Problem Relation Age of Onset  . COPD Mother   . Heart disease Mother   . Heart disease Father   . Diabetes Father   . Heart disease Sister   . Heart attack Sister   . CAD Sister   . Adrenal disorder Sister   . Heart disease Sister   . Supraventricular tachycardia Sister   . Pectus carinatum Son   . Heart murmur Daughter   . Coronary artery disease Unknown   . Lymphoma Unknown     Social History:  reports  that she has been smoking cigarettes.  She has a 11.50 pack-year smoking history. She has never used smokeless tobacco. She reports that she drinks alcohol. She reports that she does not use drugs.  Allergies: No Known Allergies  Medications Prior to Admission  Medication Sig Dispense Refill Last Dose  . acetaminophen (TYLENOL) 500 MG tablet Take 1,000 mg by mouth every 6 (six) hours as needed for mild pain.   10/21/2017 at Unknown time  . Aspirin-Acetaminophen-Caffeine (GOODY HEADACHE PO) Take by mouth as needed.   Past Month at Unknown time  . ibuprofen (ADVIL,MOTRIN) 600 MG tablet Take 1 tablet (600 mg total) by mouth every 6 (six) hours as needed. 30 tablet 0 Past Week at Unknown time  . HYDROcodone-acetaminophen (NORCO/VICODIN) 5-325 MG tablet Take 1-2 tablets by mouth every 6 (six) hours as needed for severe pain. (Patient not taking: Reported on 09/06/2017) 8 tablet 0 Not Taking  . ondansetron (ZOFRAN) 4 MG tablet Take 1 tablet (4 mg total) by mouth every 6 (six) hours. (Patient not taking: Reported on  09/06/2017) 12 tablet 0 Not Taking    ROS  Blood pressure 134/90, pulse 84, temperature 97.9 F (36.6 C), temperature source Oral, resp. rate 18, last menstrual period 10/09/2017, SpO2 100 %. Physical Exam  Breathing, conversing, and ambulating normally Well nourished, well hydrated White female, no apparent distress Heart- rrr Lungs- CTAB Abd- benign  Results for orders placed or performed during the hospital encounter of 10/22/17 (from the past 24 hour(s))  Pregnancy, urine     Status: None   Collection Time: 10/22/17  9:00 AM  Result Value Ref Range   Preg Test, Ur NEGATIVE NEGATIVE    No results found.  Assessment/Plan: Dub, adenomyosis- plan for d&c, endometrial ablation.  She understands the risks of surgery, including, but not to infection, bleeding, DVTs, damage to bowel, bladder, ureters. She wishes to proceed.     Kaiesha Tonner C Dustine Bertini 10/22/2017, 10:00 AM

## 2017-10-22 NOTE — Transfer of Care (Signed)
Immediate Anesthesia Transfer of Care Note  Patient: [name]  Procedure(s) Performed: DILATATION AND CURETTAGE (N/A )  Patient Location: PACU  Anesthesia Type:General  Level of Consciousness: awake and patient cooperative  Airway & Oxygen Therapy: Patient Spontanous Breathing and Patient connected to nasal cannula oxygen  Post-op Assessment: Report given to RN and Post -op Vital signs reviewed and stable  Post vital signs: Reviewed and stable  Last Vitals:  Vitals Value Taken Time  BP 141/93 10/22/2017 11:47 AM  Temp    Pulse    Resp 17 10/22/2017 11:48 AM  SpO2    Vitals shown include unvalidated device data.  Last Pain:  Vitals:   10/22/17 0926  TempSrc: Oral  PainSc: 0-No pain         Complications: No apparent anesthesia complications

## 2017-10-22 NOTE — Anesthesia Procedure Notes (Signed)
Procedure Name: LMA Insertion Date/Time: 10/22/2017 11:10 AM Performed by: Georgeanne Nim, CRNA Pre-anesthesia Checklist: Patient identified, Emergency Drugs available, Suction available, Patient being monitored and Timeout performed Patient Re-evaluated:Patient Re-evaluated prior to induction Oxygen Delivery Method: Circle system utilized Preoxygenation: Pre-oxygenation with 100% oxygen Induction Type: IV induction Ventilation: Mask ventilation without difficulty LMA: LMA inserted LMA Size: 4.0 Number of attempts: 1 Placement Confirmation: CO2 detector,  positive ETCO2 and breath sounds checked- equal and bilateral Tube secured with: Tape Dental Injury: Teeth and Oropharynx as per pre-operative assessment

## 2017-10-22 NOTE — Op Note (Signed)
10/22/2017  11:34 AM  PATIENT:  [name]  42 y.o. female  PRE-OPERATIVE DIAGNOSIS:  DUB  POST-OPERATIVE DIAGNOSIS:  DUB  PROCEDURE:  Procedure(s): DILATATION AND CURETTAGE (N/A)  SURGEON:  Surgeon(s) and Role:    * Livie Vanderhoof, Wilhemina Cash, MD - Primary  ANESTHESIA:   local and general  EBL:  25 mL  BLOOD ADMINISTERED:none  DRAINS: none   LOCAL MEDICATIONS USED:  MARCAINE     SPECIMEN:  Source of Specimen:  uterine curettings  DISPOSITION OF SPECIMEN:  PATHOLOGY  COUNTS:  YES  TOURNIQUET:  * No tourniquets in log *  DICTATION: .Dragon Dictation  PLAN OF CARE: Discharge to home after PACU  PATIENT DISPOSITION:  PACU - hemodynamically stable.   Delay start of Pharmacological VTE agent (>24hrs) due to surgical blood loss or risk of bleeding: not applicable    The risks, benefits, and alternatives of surgery were explained, understood, and accepted. All questions were answered. Consents were signed. In the operating room general anesthesia was applied without complication, and she was placed in the dorsal lithotomy position. Her vagina was prepped and draped in the usual sterile fashion.  A bimanual exam revealed a  normal size , anteverted mobile uterus. Her adnexa were nonenlarged. A speculum was placed and a single-tooth tenaculum was used to grasp the anterior lip of her cervix. A total of 30 mL of 0.5% Marcaine was used to perform a paracervical block. Her uterus sounded to 8 cm. Her cervix was carefully and slowly dilated to accommodate a small curette. A curettage was done in all quadrants and the fundus of the uterus. A moderate amount of polypoid-type  tissue was obtained. I then proceded with the ablation portion of the case. I chose a Minerva device. I dilated the cervix to accomodate a #8 Hegar dilator. The endocervix measured 4 cm. The device was placed in the appropriate uterine positon. It passed its tests and ran for the usual 120 seconds. The device was removed. There was  no bleeding noted at the end of the case. She was taken to the recovery room after being extubated. She tolerated the procedure well.

## 2017-10-22 NOTE — Discharge Instructions (Signed)
Dilation and Curettage or Vacuum Curettage, Care After This sheet gives you information about how to care for yourself after your procedure. Your health care provider may also give you more specific instructions. If you have problems or questions, contact your health care provider. What can I expect after the procedure? After your procedure, it is common to have:  Mild pain or cramping.  Some vaginal bleeding or spotting.  These may last for up to 2 weeks after your procedure. Follow these instructions at home: Activity   Do not drive or use heavy machinery while taking prescription pain medicine.  Avoid driving for the first 24 hours after your procedure.  Take frequent, short walks, followed by rest periods, throughout the day. Ask your health care provider what activities are safe for you. After 1-2 days, you may be able to return to your normal activities.  Do not lift anything heavier than 10 lb (4.5 kg) until your health care provider approves.  For at least 2 weeks, or as long as told by your health care provider, do not: ? Douche. ? Use tampons. ? Have sexual intercourse. General instructions   Take over-the-counter and prescription medicines only as told by your health care provider. This is especially important if you take blood thinning medicine.  Do not take baths, swim, or use a hot tub until your health care provider approves. Take showers instead of baths.  Wear compression stockings as told by your health care provider. These stockings help to prevent blood clots and reduce swelling in your legs.  It is your responsibility to get the results of your procedure. Ask your health care provider, or the department performing the procedure, when your results will be ready.  Keep all follow-up visits as told by your health care provider. This is important. Contact a health care provider if:  You have severe cramps that get worse or that do not get better with  medicine.  You have severe abdominal pain.  You cannot drink fluids without vomiting.  You develop pain in a different area of your pelvis.  You have bad-smelling vaginal discharge.  You have a rash. Get help right away if:  You have vaginal bleeding that soaks more than one sanitary pad in 1 hour, for 2 hours in a row.  You pass large blood clots from your vagina.  You have a fever that is above 100.29F (38.0C).  Your abdomen feels very tender or hard.  You have chest pain.  You have shortness of breath.  You cough up blood.  You feel dizzy or light-headed.  You faint.  You have pain in your neck or shoulder area. This information is not intended to replace advice given to you by your health care provider. Make sure you discuss any questions you have with your health care provider.   Post Anesthesia Home Care Instructions  Activity: Get plenty of rest for the remainder of the day. A responsible individual must stay with you for 24 hours following the procedure.  For the next 24 hours, DO NOT: -Drive a car -Paediatric nurse -Drink alcoholic beverages -Take any medication unless instructed by your physician -Make any legal decisions or sign important papers.  Meals: Start with liquid foods such as gelatin or soup. Progress to regular foods as tolerated. Avoid greasy, spicy, heavy foods. If nausea and/or vomiting occur, drink only clear liquids until the nausea and/or vomiting subsides. Call your physician if vomiting continues.  Special Instructions/Symptoms: Your throat may feel dry  or sore from the anesthesia or the breathing tube placed in your throat during surgery. If this causes discomfort, gargle with warm salt water. The discomfort should disappear within 24 hours.  If you had a scopolamine patch placed behind your ear for the management of post- operative nausea and/or vomiting:  1. The medication in the patch is effective for 72 hours, after which it  should be removed.  Wrap patch in a tissue and discard in the trash. Wash hands thoroughly with soap and water. 2. You may remove the patch earlier than 72 hours if you experience unpleasant side effects which may include dry mouth, dizziness or visual disturbances. 3. Avoid touching the patch. Wash your hands with soap and water after contact with the patch.

## 2017-10-23 ENCOUNTER — Encounter (HOSPITAL_COMMUNITY): Payer: Self-pay | Admitting: Obstetrics & Gynecology

## 2017-10-23 NOTE — Anesthesia Postprocedure Evaluation (Signed)
Anesthesia Post Note  Patient: [name]  Procedure(s) Performed: DILATATION AND CURETTAGE (N/A )     Patient location during evaluation: PACU Anesthesia Type: General Level of consciousness: awake and alert Pain management: pain level controlled Vital Signs Assessment: post-procedure vital signs reviewed and stable Respiratory status: spontaneous breathing, nonlabored ventilation, respiratory function stable and patient connected to nasal cannula oxygen Cardiovascular status: blood pressure returned to baseline and stable Postop Assessment: no apparent nausea or vomiting Anesthetic complications: no    Last Vitals:  Vitals:   10/22/17 1215 10/22/17 1248  BP: 135/87 (!) 141/86  Pulse: 65 63  Resp: 15 16  Temp: 36.5 C   SpO2: 100% 100%    Last Pain:  Vitals:   10/22/17 1215  TempSrc:   PainSc: 0-No pain                 Lloyde Ludlam EDWARD

## 2017-11-21 ENCOUNTER — Ambulatory Visit: Payer: Self-pay | Admitting: Obstetrics & Gynecology

## 2018-01-10 ENCOUNTER — Ambulatory Visit (HOSPITAL_BASED_OUTPATIENT_CLINIC_OR_DEPARTMENT_OTHER)
Admission: RE | Admit: 2018-01-10 | Discharge: 2018-01-10 | Disposition: A | Discharge status: Home / Self Care | Payer: Self-pay | Source: Ambulatory Visit | Attending: Family Medicine | Admitting: Family Medicine

## 2018-01-10 ENCOUNTER — Ambulatory Visit (INDEPENDENT_AMBULATORY_CARE_PROVIDER_SITE_OTHER): Payer: Self-pay | Admitting: Family Medicine

## 2018-01-10 ENCOUNTER — Encounter: Payer: Self-pay | Admitting: Family Medicine

## 2018-01-10 VITALS — BP 120/80 | HR 95 | Temp 99.4°F | Ht 65.0 in | Wt 120.4 lb

## 2018-01-10 DIAGNOSIS — M79605 Pain in left leg: Secondary | ICD-10-CM | POA: Insufficient documentation

## 2018-01-10 NOTE — Patient Instructions (Addendum)
Ibuprofen 400-600 mg (2-3 over the counter strength tabs) every 6 hours as needed for pain.  OK to take Tylenol 1000 mg (2 extra strength tabs) or 975 mg (3 regular strength tabs) every 6 hours as needed.  If your X-ray is normal, I will send you a MyChart message. If abnormal, I will call you. Answer from a blocked number this weekend.  Ankle Exercises It is normal to feel mild stretching, pulling, tightness, or discomfort as you do these exercises, but you should stop right away if you feel sudden pain or your pain gets worse.  Stretching and range of motion exercises These exercises warm up your muscles and joints and improve the movement and flexibility of your ankle. These exercises also help to relieve pain, numbness, and tingling. Exercise A: Dorsiflexion/Plantar Flexion   1. Sit with your affected knee straight or bent. Do not rest your foot on anything. 2. Flex your affected ankle to tilt the top of your foot toward your shin. 3. Hold this position for 5 seconds. 4. Point your toes downward to tilt the top of your foot away from your shin. 5. Hold this position for 5 seconds. Repeat 2 times. Complete this exercise 3 times per week. Exercise B: Ankle Alphabet   1. Sit with your affected foot supported at your lower leg. ? Do not rest your foot on anything. ? Make sure your foot has room to move freely. 2. Think of your affected foot as a paintbrush, and move your foot to trace each letter of the alphabet in the air. Keep your hip and knee still while you trace. Make the letters as large as you can without increasing any discomfort. 3. Trace every letter from A to Z. Repeat 2 times. Complete this exercise 3 times per week. Strengthening exercises These exercises build strength and endurance in your ankle. Endurance is the ability to use your muscles for a long time, even after they get tired. Exercise D: Dorsiflexors   1. Secure a rubber exercise band or tube to an object,  such as a table leg, that will stay still when the band is pulled. Secure the other end around your affected foot. 2. Sit on the floor, facing the object with your affected leg extended. The band or tube should be slightly tense when your foot is relaxed. 3. Slowly flex your affected ankle and toes to bring your foot toward you. 4. Hold this position for 3 seconds.  5. Slowly return your foot to the starting position, controlling the band as you do that. Do a total of 10 repetitions. Repeat 2 times. Complete this exercise 3 times per week. Exercise E: Plantar Flexors   1. Sit on the floor with your affected leg extended. 2. Loop a rubber exercise band or tube around the ball of your affected foot. The ball of your foot is on the walking surface, right under your toes. The band or tube should be slightly tense when your foot is relaxed. 3. Slowly point your toes downward, pushing them away from you. 4. Hold this position for 3 seconds. 5. Slowly release the tension in the band or tube, controlling smoothly until your foot is back in the starting position. Repeat for a total of 10 repetitions. Repeat 2 times. Complete this exercise 3 times per week. Exercise F: Towel Curls   1. Sit in a chair on a non-carpeted surface, and put your feet on the floor. 2. Place a towel in front of your feet.  3. Keeping your heel on the floor, put your affected foot on the towel. 4. Pull the towel toward you by grabbing the towel with your toes and curling them under. Keep your heel on the floor. 5. Let your toes relax. 6. Grab the towel again. Keep going until the towel is completely underneath your foot. Repeat for a total of 10 repetitions. Repeat 2 times. Complete this exercise 3 times per week. Exercise G: Heel Raise ( Plantar Flexors, Standing)    1. Stand with your feet shoulder-width apart. 2. Keep your weight spread evenly over the width of your feet while you rise up on your toes. Use a wall or  table to steady yourself, but try not to use it for support. 3. If this exercise is too easy, try these options: ? Shift your weight toward your affected leg until you feel challenged. ? If told by your health care provider, lift your uninjured leg off the floor. 4. Hold this position for 3 seconds. Repeat for a total of 10 repetitions. Repeat 2 times. Complete this exercise 3 times per week. Exercise H: Tandem Walking 1. Stand with one foot directly in front of the other. 2. Slowly raise your back foot up, lifting your heel before your toes, and place it directly in front of your other foot. 3. Continue to walk in this heel-to-toe way for 10 steps or for as long as told by your health care provider. Have a countertop or wall nearby to use if needed to keep your balance, but try not to hold onto anything for support. Repeat 2 times. Complete this exercises 3 times per week. Make sure you discuss any questions you have with your health care provider. Document Released: 05/16/2005 Document Revised: 03/01/2016 Document Reviewed: 03/20/2015 Elsevier Interactive Patient Education  2018 [employer].

## 2018-01-10 NOTE — Progress Notes (Signed)
Pre visit review using our clinic review tool, if applicable. No additional management support is needed unless otherwise documented below in the visit note. 

## 2018-01-10 NOTE — Progress Notes (Signed)
Musculoskeletal Exam  Patient: [name] DOB: Nov 02, 1975  DOS: 01/10/2018  SUBJECTIVE:  Chief Complaint:   Chief Complaint  Patient presents with  . Fall    [name] is a 42 y.o.  female for evaluation and treatment of L ankle pain.   Onset:  3 weeks ago. Fell on L side, could have been a fall up to 8 feet. Unsure if ankle fell. Location: L ankle and lateral leg Character:  Feels like a bruise  Progression of issue:  is unchanged Associated symptoms: Pain with walking/pressure on heel Treatment: to date has been OTC NSAIDS.   Neurovascular symptoms: no  ROS: Musculoskeletal/Extremities: +L LE pain  Past Medical History:  Diagnosis Date  . Anemia   . Heart murmur    no problems  . Hemorrhoids   . Hot thyroid nodule 2009   needle bx negative  . Lung abscess ([place]) 01/15/2011  . Melanoma ([place])    stage 3 - mid back  . Menorrhagia   . Migraine headache   . Pituitary cyst ([place])   . 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 SVD (spontaneous vaginal delivery)    x 2  . Vaso vagal episode   . WEIGHT LOSS, ABNORMAL 06/24/2008   Qualifier: Diagnosis of  By: Tommy Medal MD, Neoma Laming: Diagnosis of  By: Walker Kehr MD, Patrick Jupiter      Objective: VITAL SIGNS: BP 120/80 (BP Location: Left Arm, Patient Position: Sitting, Cuff Size: Normal)   Pulse 95   Temp 99.4 F (37.4 C) (Oral)   Ht 5\' 5"  (1.651 m)   Wt 120 lb 6 oz (54.6 kg)   SpO2 98%   BMI 20.03 kg/m  Constitutional: Well formed, well developed. No acute distress. Cardiovascular: Brisk cap refill Thorax & Lungs: No accessory muscle use Musculoskeletal: RLE.   Tenderness to palpation: yes over distal fibula on L Deformity: no Ecchymosis: no Tests positive: None Tests negative: Ant drawer, squeeze, no ttp over prox fib Neurologic: Normal sensory function.  Psychiatric: Normal mood. Age appropriate judgment and insight. Alert & oriented x 3.    Assessment:  Pain of left lower extremity - Plan: DG Tibia/Fibula  Left  Plan: Orders as above. Ice, antiinflammatories, stretches/exercises for ankle/PT tendon. Walker boot given pending results of XR. F/u prn. The patient voiced understanding and agreement to the plan.   [place], DO 01/10/18  4:24 PM

## 2018-01-12 ENCOUNTER — Encounter (INDEPENDENT_AMBULATORY_CARE_PROVIDER_SITE_OTHER): Payer: Self-pay

## 2019-04-28 ENCOUNTER — Encounter: Payer: Self-pay | Admitting: Cardiology

## 2019-04-28 ENCOUNTER — Ambulatory Visit (INDEPENDENT_AMBULATORY_CARE_PROVIDER_SITE_OTHER): Payer: Self-pay | Admitting: Cardiology

## 2019-04-28 ENCOUNTER — Other Ambulatory Visit: Payer: Self-pay

## 2019-04-28 VITALS — BP 136/70 | HR 93 | Ht 65.0 in | Wt 126.0 lb

## 2019-04-28 DIAGNOSIS — R0789 Other chest pain: Secondary | ICD-10-CM

## 2019-04-28 DIAGNOSIS — F17200 Nicotine dependence, unspecified, uncomplicated: Secondary | ICD-10-CM

## 2019-04-28 DIAGNOSIS — I10 Essential (primary) hypertension: Secondary | ICD-10-CM

## 2019-04-28 DIAGNOSIS — Z716 Tobacco abuse counseling: Secondary | ICD-10-CM

## 2019-04-28 MED ORDER — ASPIRIN EC 81 MG PO TBEC: 81.0000 mg | DELAYED_RELEASE_TABLET | Freq: Every day | ORAL | 3 refills | Status: DC

## 2019-04-28 NOTE — Patient Instructions (Signed)
Medication Instructions:  Your physician has recommended you make the following change in your medication:   START: aspirin 81 mg daily   If you need a refill on your cardiac medications before your next appointment, please call your pharmacy.   Lab work: Your physician recommends that you return for lab work today: Lipids, troponin   If you have labs (blood work) drawn today and your tests are completely normal, you will receive your results only by: Marland Kitchen MyChart Message (if you have MyChart) OR . A paper copy in the mail If you have any lab test that is abnormal or we need to change your treatment, we will call you to review the results.  Testing/Procedures: Your physician has requested that you have a lexiscan myoview. For further information please visit [email or web address]. Please follow instruction sheet, as given.    Follow-Up: At [facility], you and your health needs are our priority.  As part of our continuing mission to provide you with exceptional heart care, we have created designated Provider Care Teams.  These Care Teams include your primary Cardiologist (physician) and Advanced Practice Providers (APPs -  Physician Assistants and Nurse Practitioners) who all work together to provide you with the care you need, when you need it. You will need a follow up appointment in 1 months.  Please call our office 2 months in advance to schedule this appointment.  You may see No primary care provider on file. or another member of our [employer] Provider Team in [place]: Shirlee More, MD . Jyl Heinz, MD  Any Other Special Instructions Will Be Listed Below (If Applicable).   Aspirin and Your Heart  Aspirin is a medicine that prevents the cells in the blood that are used for clotting, called platelets, from sticking together. Aspirin can be used to help reduce the risk of blood clots, heart attacks, and other heart-related problems. Can I take aspirin? Your health care  provider will help you determine whether it is safe and beneficial for you to take aspirin daily. Taking aspirin daily may be helpful if you:  Have had a heart attack or chest pain.  Are at risk for a heart attack.  Have undergone open-heart surgery, such as coronary artery bypass surgery (CABG).  Have had coronary angioplasty or a stent.  Have had certain types of stroke or transient ischemic attack (TIA).  Have peripheral artery disease (PAD).  Have chronic heart rhythm problems such as atrial fibrillation and cannot take an anticoagulant.  Have valve disease or have had surgery on a valve. What are the risks? Daily use of aspirin can cause side effects. Some of these include:  Bleeding. Bleeding problems can be minor or serious. An example of a minor problem is a cut that does not stop bleeding. An example of a more serious problem is stomach bleeding or, rarely, bleeding into the brain. Your risk of bleeding is increased if you are also taking non-steroidal anti-inflammatory drugs (NSAIDs).  Increased bruising.  Upset stomach.  An allergic reaction. People who have nasal polyps have an increased risk of developing an aspirin allergy. General guidelines  Take aspirin only as told by your health care provider. Make sure that you understand how much you should take and what form you should take. The two forms of aspirin are: ? Non-enteric-coated.This type of aspirin does not have a coating and is absorbed quickly. This type of aspirin also comes in a chewable form. ? Enteric-coated. This type of aspirin has a  coating that releases the medicine very slowly. Enteric-coated aspirin might cause less stomach upset than non-enteric-coated aspirin. This type of aspirin should not be chewed or crushed.  Limit alcohol intake to no more than 1 drink a day for nonpregnant women and 2 drinks a day for men. Drinking alcohol increases your risk of bleeding. One drink equals 12 oz of beer, 5 oz of  wine, or 1 oz of hard liquor. Contact a health care provider if you:  Have unusual bleeding or bruising.  Have stomach pain or nausea.  Have ringing in your ears.  Have an allergic reaction that causes: ? Hives. ? Itchy skin. ? Swelling of the lips, tongue, or face. Get help right away if you:  Notice that your bowel movements are bloody, dark red, or black in color.  Vomit or cough up blood.  Have blood in your urine.  Cough, have noisy breathing (wheeze), or feel short of breath.  Have chest pain, especially if the pain spreads to the arms, back, neck, or jaw.  Have a severe headache, or a headache with confusion, or dizziness. These symptoms may represent a serious problem that is an emergency. Do not wait to see if the symptoms will go away. Get medical help right away. Call your local emergency services (911 in the [place] Do not drive yourself to the hospital. Summary  Aspirin can be used to help reduce the risk of blood clots, heart attacks, and other heart-related problems.  Daily use of aspirin can increase your risk of side effects. Your health care provider will help you determine whether it is safe and beneficial for you to take aspirin daily.  Take aspirin only as told by your health care provider. Make sure that you understand how much you can take and what form you can take. This information is not intended to replace advice given to you by your health care provider. Make sure you discuss any questions you have with your health care provider. Document Released: 06/14/2008 Document Revised: 05/02/2017 Document Reviewed: 05/02/2017 Elsevier Patient Education  2020 [place].   Cardiac Nuclear Scan A cardiac nuclear scan is a test that measures blood flow to the heart when a person is resting and when he or she is exercising. The test looks for problems such as:  Not enough blood reaching a portion of the heart.  The heart muscle not working normally. You  may need this test if:  You have heart disease.  You have had abnormal lab results.  You have had heart surgery or a balloon procedure to open up blocked arteries (angioplasty).  You have chest pain.  You have shortness of breath. In this test, a radioactive dye (tracer) is injected into your bloodstream. After the tracer has traveled to your heart, an imaging device is used to measure how much of the tracer is absorbed by or distributed to various areas of your heart. This procedure is usually done at a hospital and takes 2-4 hours. Tell a health care provider about:  Any allergies you have.  All medicines you are taking, including vitamins, herbs, eye drops, creams, and over-the-counter medicines.  Any problems you or family members have had with anesthetic medicines.  Any blood disorders you have.  Any surgeries you have had.  Any medical conditions you have.  Whether you are pregnant or may be pregnant. What are the risks? Generally, this is a safe procedure. However, problems may occur, including:  Serious chest pain and heart attack.  This is only a risk if the stress portion of the test is done.  Rapid heartbeat.  Sensation of warmth in your chest. This usually passes quickly.  Allergic reaction to the tracer. What happens before the procedure?  Ask your health care provider about changing or stopping your regular medicines. This is especially important if you are taking diabetes medicines or blood thinners.  Follow instructions from your health care provider about eating or drinking restrictions.  Remove your jewelry on the day of the procedure. What happens during the procedure?  An IV will be inserted into one of your veins.  Your health care provider will inject a small amount of radioactive tracer through the IV.  You will wait for 20-40 minutes while the tracer travels through your bloodstream.  Your heart activity will be monitored with an  electrocardiogram (ECG).  You will lie down on an exam table.  Images of your heart will be taken for about 15-20 minutes.  You may also have a stress test. For this test, one of the following may be done: ? You will exercise on a treadmill or stationary bike. While you exercise, your heart's activity will be monitored with an ECG, and your blood pressure will be checked. ? You will be given medicines that will increase blood flow to parts of your heart. This is done if you are unable to exercise.  When blood flow to your heart has peaked, a tracer will again be injected through the IV.  After 20-40 minutes, you will get back on the exam table and have more images taken of your heart.  Depending on the type of tracer used, scans may need to be repeated 3-4 hours later.  Your IV line will be removed when the procedure is over. The procedure may vary among health care providers and hospitals. What happens after the procedure?  Unless your health care provider tells you otherwise, you may return to your normal schedule, including diet, activities, and medicines.  Unless your health care provider tells you otherwise, you may increase your fluid intake. This will help to flush the contrast dye from your body. Drink enough fluid to keep your urine pale yellow.  Ask your health care provider, or the department that is doing the test: ? When will my results be ready? ? How will I get my results? Summary  A cardiac nuclear scan measures the blood flow to the heart when a person is resting and when he or she is exercising.  Tell your health care provider if you are pregnant.  Before the procedure, ask your health care provider about changing or stopping your regular medicines. This is especially important if you are taking diabetes medicines or blood thinners.  After the procedure, unless your health care provider tells you otherwise, increase your fluid intake. This will help flush the  contrast dye from your body.  After the procedure, unless your health care provider tells you otherwise, you may return to your normal schedule, including diet, activities, and medicines. This information is not intended to replace advice given to you by your health care provider. Make sure you discuss any questions you have with your health care provider. Document Released: 07/27/2004 Document Revised: 12/16/2017 Document Reviewed: 12/16/2017 Elsevier Patient Education  2020 [employer].

## 2019-04-28 NOTE — Progress Notes (Signed)
Cardiology Office Note:    Date:  04/28/2019   ID:  [name], DOB 21-Sep-1975, MRN [id number]  PCP:  Darreld Mclean, MD  Cardiologist:  Jenne Campus, MD    Referring MD: Darreld Mclean, MD   Chief Complaint  Patient presents with  . Follow-up  Have chest pain  History of Present Illness:    [name] is a 43 y.o. female with history of chest pain.  That being investigated t in 2018 she did have a stress test which showed no evidence of ischemia, echocardiogram was done which showed no significant problems.  Now few weeks ago she started experiencing some numbness and pain in the right arm also described to have some pain in the chest it does not happen with exercise.  She complained of being weak and tired when it happens.  There is no shortness of breath there is no sweating.  Sadly she continues to smoke and she said she smoked more than before.  Described to have some exertional shortness of breath.  Past Medical History:  Diagnosis Date  . Anemia   . Heart murmur    no problems  . Hemorrhoids   . Hot thyroid nodule 2009   needle bx negative  . Lung abscess ([place]) 01/15/2011  . Melanoma ([place])    stage 3 - mid back  . Menorrhagia   . Migraine headache   . Pituitary cyst ([place])   . 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 SVD (spontaneous vaginal delivery)    x 2  . Vaso vagal episode   . WEIGHT LOSS, ABNORMAL 06/24/2008   Qualifier: Diagnosis of  By: Tommy Medal MD, Neoma Laming: Diagnosis of  By: Walker Kehr MD, Patrick Jupiter      Past Surgical History:  Procedure Laterality Date  . BREAST SURGERY     augmentation  . BRONCHOSCOPY    . COLONOSCOPY    . DILATION AND CURETTAGE OF UTERUS N/A 10/22/2017   Procedure: DILATATION AND CURETTAGE;  Surgeon: Emily Filbert, MD;  Location: [facility];  Service: Gynecology;  Laterality: N/A;  . LUNG REMOVAL, PARTIAL Left 2010   upper left lobe  . MEDIASTINOSCOPY    . TUBAL LIGATION    . UPPER GI ENDOSCOPY    . wide excision     melanoma removed - mid back  . WISDOM TOOTH EXTRACTION      Current Medications: No outpatient medications have been marked as taking for the 04/28/19 encounter (Office Visit) with Park Liter, MD.     Allergies:   Patient has no known allergies.   Social History   Socioeconomic History  . Marital status: Married    Spouse name: Not on file  . Number of children: 2  . Years of education: Not on file  . Highest education level: Not on file  Occupational History    Employer: [employer]  Social Needs  . Financial resource strain: Not on file  . Food insecurity    Worry: Not on file    Inability: Not on file  . Transportation needs    Medical: Not on file    Non-medical: Not on file  Tobacco Use  . Smoking status: Current Every Day Smoker    Packs/day: 0.50    Years: 23.00    Pack years: 11.50    Types: Cigarettes  . Smokeless tobacco: Never Used  Substance and Sexual Activity  . Alcohol use: Yes    Comment:  Occasional  . Drug use: No    Types: Marijuana    Comment: last use Sat/Sun 3/30-3/31  . Sexual activity: Yes    Birth control/protection: Surgical  Lifestyle  . Physical activity    Days per week: Not on file    Minutes per session: Not on file  . Stress: Not on file  Relationships  . Social Herbalist on phone: Not on file    Gets together: Not on file    Attends religious service: Not on file    Active member of club or organization: Not on file    Attends meetings of clubs or organizations: Not on file    Relationship status: Not on file  Other Topics Concern  . Not on file  Social History Narrative  . Not on file     Family History: The patient's family history includes Adrenal disorder in her sister; CAD in her sister; COPD in her mother; Coronary artery disease in her unknown relative; Diabetes in her father; Heart attack in her sister; Heart disease in her father, mother, sister, and sister; Heart murmur in her daughter; Lymphoma  in her unknown relative; Pectus carinatum in her son; Supraventricular tachycardia in her sister. ROS:   Please see the history of present illness.    All 14 point review of systems negative except as described per history of present illness  EKGs/Labs/Other Studies Reviewed:      Recent Labs: No results found for requested labs within last 8760 hours.  Recent Lipid Panel    Component Value Date/Time   CHOL 159 04/15/2017 1017   TRIG 71.0 04/15/2017 1017   HDL 52.10 04/15/2017 1017   CHOLHDL 3 04/15/2017 1017   VLDL 14.2 04/15/2017 1017   LDLCALC 92 04/15/2017 1017    Physical Exam:    VS:  BP 136/70   Pulse 93   Ht 5\' 5"  (1.651 m)   Wt 126 lb (57.2 kg)   SpO2 96%   BMI 20.97 kg/m     Wt Readings from Last 3 Encounters:  04/28/19 126 lb (57.2 kg)  01/10/18 120 lb 6 oz (54.6 kg)  10/14/17 129 lb 6 oz (58.7 kg)     GEN:  Well nourished, well developed in no acute distress HEENT: Normal NECK: No JVD; No carotid bruits LYMPHATICS: No lymphadenopathy CARDIAC: RRR, no murmurs, no rubs, no gallops RESPIRATORY:  Clear to auscultation without rales, wheezing or rhonchi  ABDOMEN: Soft, non-tender, non-distended MUSCULOSKELETAL:  No edema; No deformity  SKIN: Warm and dry LOWER EXTREMITIES: no swelling NEUROLOGIC:  Alert and oriented x 3 PSYCHIATRIC:  Normal affect   ASSESSMENT:    1. Atypical chest pain   2. Smoking   3. Tobacco abuse counseling   4. Paroxysmal hypertension    PLAN:    In order of problems listed above:  1. Atypical chest pain.  Will check troponin I, EKG will be done as well.  I will start her back on aspirin not she supposed to be.  She will be scheduled to have a stress test.  I asked her to let me know if she got some more chest pain. Smoking counseling was given about need to quit smoking she understands she will try to do that Paroxysmal hypertension.  Blood pressure today is good we will continue present management.  Overall I am  concerned about her symptoms prior work-up for coronary artery disease was negative however she does have atherosclerosis of the aorta, therefore  aggressive risk factors modification will need to be done.  She need to quit smoking she will need to be on statin.  We will check fasting lipid profile to decide which 1 to use.   Medication Adjustments/Labs and Tests Ordered: Current medicines are reviewed at length with the patient today.  Concerns regarding medicines are outlined above.  No orders of the defined types were placed in this encounter.  Medication changes: No orders of the defined types were placed in this encounter.   Signed, Park Liter, MD, [facility] 04/28/2019 4:14 PM    [place]

## 2019-04-29 ENCOUNTER — Telehealth (HOSPITAL_COMMUNITY): Payer: Self-pay | Admitting: *Deleted

## 2019-04-29 LAB — LIPID PANEL
Chol/HDL Ratio: 2.8 ratio (ref 0.0–4.4)
Cholesterol, Total: 171 mg/dL (ref 100–199)
HDL: 62 mg/dL (ref >39)
LDL Chol Calc (NIH): 95 mg/dL (ref 0–99)
Triglycerides: 76 mg/dL (ref 0–149)
VLDL Cholesterol Cal: 14 mg/dL (ref 5–40)

## 2019-04-29 LAB — TROPONIN I: Troponin I: <0.01 ng/mL (ref 0.00–0.04)

## 2019-04-29 NOTE — Telephone Encounter (Signed)
Left message on voicemail per DPR in reference to upcoming appointment scheduled on 04/30/19 with detailed instructions given per Myocardial Perfusion Study Information Sheet for the test. LM to arrive 15 minutes early, and that it is imperative to arrive on time for appointment to keep from having the test rescheduled. If you need to cancel or reschedule your appointment, please call the office within 24 hours of your appointment. Failure to do so may result in a cancellation of your appointment, and a $50 no show fee. Phone number given for call back for any questions. Kirstie Peri

## 2019-04-30 ENCOUNTER — Ambulatory Visit (HOSPITAL_COMMUNITY): Payer: Self-pay | Attending: Cardiology

## 2019-04-30 ENCOUNTER — Other Ambulatory Visit: Payer: Self-pay

## 2019-04-30 VITALS — Ht 65.0 in | Wt 126.0 lb

## 2019-04-30 DIAGNOSIS — R110 Nausea: Secondary | ICD-10-CM | POA: Insufficient documentation

## 2019-04-30 DIAGNOSIS — R0789 Other chest pain: Secondary | ICD-10-CM | POA: Insufficient documentation

## 2019-04-30 DIAGNOSIS — R002 Palpitations: Secondary | ICD-10-CM | POA: Insufficient documentation

## 2019-04-30 DIAGNOSIS — F17200 Nicotine dependence, unspecified, uncomplicated: Secondary | ICD-10-CM | POA: Insufficient documentation

## 2019-04-30 DIAGNOSIS — I10 Essential (primary) hypertension: Secondary | ICD-10-CM | POA: Insufficient documentation

## 2019-04-30 DIAGNOSIS — R42 Dizziness and giddiness: Secondary | ICD-10-CM | POA: Insufficient documentation

## 2019-04-30 LAB — MYOCARDIAL PERFUSION IMAGING
LV dias vol: 63 mL (ref 46–106)
LV sys vol: 24 mL
Peak HR: 127 {beats}/min
Rest HR: 76 {beats}/min
SDS: 5
SRS: 1
SSS: 6
TID: 1.02

## 2019-04-30 MED ORDER — AMINOPHYLLINE 25 MG/ML IV SOLN
150.0000 mg | Freq: Once | INTRAVENOUS | Status: AC
  Administered 2019-04-30: 150 mg via INTRAVENOUS

## 2019-04-30 MED ORDER — TECHNETIUM TC 99M TETROFOSMIN IV KIT
32.2300 | PACK | Freq: Once | INTRAVENOUS | Status: AC | PRN
  Administered 2019-04-30: 32.23 via INTRAVENOUS
  Filled 2019-04-30: qty 33

## 2019-04-30 MED ORDER — REGADENOSON 0.4 MG/5ML IV SOLN
0.4000 mg | Freq: Once | INTRAVENOUS | Status: AC
  Administered 2019-04-30: 0.4 mg via INTRAVENOUS

## 2019-04-30 MED ORDER — TECHNETIUM TC 99M TETROFOSMIN IV KIT
10.2000 | PACK | Freq: Once | INTRAVENOUS | Status: AC | PRN
  Administered 2019-04-30: 10.2 via INTRAVENOUS
  Filled 2019-04-30: qty 11

## 2019-05-01 ENCOUNTER — Telehealth: Payer: Self-pay | Admitting: Emergency Medicine

## 2019-05-01 NOTE — Telephone Encounter (Signed)
Left lab results on patients voicemail per dpr. Advised patient to return call with any questions.

## 2019-05-22 ENCOUNTER — Other Ambulatory Visit: Payer: Self-pay

## 2019-05-22 ENCOUNTER — Ambulatory Visit (INDEPENDENT_AMBULATORY_CARE_PROVIDER_SITE_OTHER): Payer: Self-pay | Admitting: Cardiology

## 2019-05-22 ENCOUNTER — Encounter: Payer: Self-pay | Admitting: Cardiology

## 2019-05-22 VITALS — BP 130/62 | HR 102 | Ht 65.0 in | Wt 122.8 lb

## 2019-05-22 DIAGNOSIS — I739 Peripheral vascular disease, unspecified: Secondary | ICD-10-CM

## 2019-05-22 DIAGNOSIS — R002 Palpitations: Secondary | ICD-10-CM

## 2019-05-22 DIAGNOSIS — R0789 Other chest pain: Secondary | ICD-10-CM

## 2019-05-22 DIAGNOSIS — F17200 Nicotine dependence, unspecified, uncomplicated: Secondary | ICD-10-CM

## 2019-05-22 MED ORDER — ATORVASTATIN CALCIUM 10 MG PO TABS: 10.0000 mg | ORAL_TABLET | Freq: Every day | ORAL | 1 refills | Status: DC

## 2019-05-22 NOTE — Patient Instructions (Addendum)
Medication Instructions:  Your physician has recommended you make the following change in your medication:   START: Lipitor 10 mg daily   *If you need a refill on your cardiac medications before your next appointment, please call your pharmacy*  Lab Work: Your physician recommends that you return for lab work in 6 weeks no appointment needed FASTING: lipids, ast, alt   If you have labs (blood work) drawn today and your tests are completely normal, you will receive your results only by: Marland Kitchen MyChart Message (if you have MyChart) OR . A paper copy in the mail If you have any lab test that is abnormal or we need to change your treatment, we will call you to review the results.  Testing/Procedures: None.   Follow-Up: At [facility] [place], you and your health needs are our priority.  As part of our continuing mission to provide you with exceptional heart care, we have created designated Provider Care Teams.  These Care Teams include your primary Cardiologist (physician) and Advanced Practice Providers (APPs -  Physician Assistants and Nurse Practitioners) who all work together to provide you with the care you need, when you need it.  Your next appointment:   6 months  The format for your next appointment:   In Person  Provider:   You may see Dr. Agustin Cree or the following Advanced Practice Provider on your designated Care Team:    Laurann Montana, FNP   Other Instructions  Atorvastatin tablets What is this medicine? ATORVASTATIN (a TORE va sta tin) is known as a HMG-CoA reductase inhibitor or 'statin'. It lowers the level of cholesterol and triglycerides in the blood. This drug may also reduce the risk of heart attack, stroke, or other health problems in patients with risk factors for heart disease. Diet and lifestyle changes are often used with this drug. This medicine may be used for other purposes; ask your health care provider or pharmacist if you have questions. COMMON BRAND NAME(S):  Lipitor What should I tell my health care provider before I take this medicine? They need to know if you have any of these conditions:  diabetes  if you often drink alcohol  history of stroke  kidney disease  liver disease  muscle aches or weakness  thyroid disease  an unusual or allergic reaction to atorvastatin, other medicines, foods, dyes, or preservatives  pregnant or trying to get pregnant  breast-feeding How should I use this medicine? Take this medicine by mouth with a glass of water. Follow the directions on the prescription label. You can take it with or without food. If it upsets your stomach, take it with food. Do not take with grapefruit juice. Take your medicine at regular intervals. Do not take it more often than directed. Do not stop taking except on your doctor's advice. Talk to your pediatrician regarding the use of this medicine in children. While this drug may be prescribed for children as young as 10 for selected conditions, precautions do apply. Overdosage: If you think you have taken too much of this medicine contact a poison control center or emergency room at once. NOTE: This medicine is only for you. Do not share this medicine with others. What if I miss a dose? If you miss a dose, take it as soon as you can. If your next dose is to be taken in less than 12 hours, then do not take the missed dose. Take the next dose at your regular time. Do not take double or extra doses.  What may interact with this medicine? Do not take this medicine with any of the following medications:  dasabuvir; ombitasvir; paritaprevir; ritonavir  ombitasvir; paritaprevir; ritonavir  posaconazole  red yeast rice This medicine may also interact with the following medications:  alcohol  birth control pills  certain antibiotics like erythromycin and clarithromycin  certain antivirals for HIV or hepatitis  certain medicines for cholesterol like fenofibrate, gemfibrozil,  and niacin  certain medicines for fungal infections like ketoconazole and itraconazole  colchicine  cyclosporine  digoxin  grapefruit juice  rifampin This list may not describe all possible interactions. Give your health care provider a list of all the medicines, herbs, non-prescription drugs, or dietary supplements you use. Also tell them if you smoke, drink alcohol, or use illegal drugs. Some items may interact with your medicine. What should I watch for while using this medicine? Visit your doctor or health care professional for regular check-ups. You may need regular tests to make sure your liver is working properly. Your health care professional may tell you to stop taking this medicine if you develop muscle problems. If your muscle problems do not go away after stopping this medicine, contact your health care professional. Do not become pregnant while taking this medicine. Women should inform their health care professional if they wish to become pregnant or think they might be pregnant. There is a potential for serious side effects to an unborn child. Talk to your health care professional or pharmacist for more information. Do not breast-feed an infant while taking this medicine. This medicine may increase blood sugar. Ask your healthcare provider if changes in diet or medicines are needed if you have diabetes. If you are going to need surgery or other procedure, tell your doctor that you are using this medicine. This drug is only part of a total heart-health program. Your doctor or a dietician can suggest a low-cholesterol and low-fat diet to help. Avoid alcohol and smoking, and keep a proper exercise schedule. This medicine may cause a decrease in Co-Enzyme Q-10. You should make sure that you get enough Co-Enzyme Q-10 while you are taking this medicine. Discuss the foods you eat and the vitamins you take with your health care professional. What side effects may I notice from receiving  this medicine? Side effects that you should report to your doctor or health care professional as soon as possible:  allergic reactions like skin rash, itching or hives, swelling of the face, lips, or tongue  fever  joint pain  loss of memory  redness, blistering, peeling or loosening of the skin, including inside the mouth  signs and symptoms of high blood sugar such as being more thirsty or hungry or having to urinate more than normal. You may also feel very tired or have blurry vision.  signs and symptoms of liver injury like dark yellow or brown urine; general ill feeling or flu-like symptoms; light-belly pain; unusually weak or tired; yellowing of the eyes or skin  signs and symptoms of muscle injury like dark urine; trouble passing urine or change in the amount of urine; unusually weak or tired; muscle pain or side or back pain Side effects that usually do not require medical attention (report to your doctor or health care professional if they continue or are bothersome):  diarrhea  nausea  stomach pain  trouble sleeping  upset stomach This list may not describe all possible side effects. Call your doctor for medical advice about side effects. You may report side effects to FDA  at 1-800-FDA-1088. Where should I keep my medicine? Keep out of the reach of children. Store between 20 and 25 degrees C (68 and 77 degrees F). Throw away any unused medicine after the expiration date. NOTE: This sheet is a summary. It may not cover all possible information. If you have questions about this medicine, talk to your doctor, pharmacist, or health care provider.  2020 Elsevier/Gold Standard (2018-04-23 11:36:16)

## 2019-05-22 NOTE — Progress Notes (Signed)
Cardiology Office Note:    Date:  05/22/2019   ID:  [name], DOB [birth year], MRN [id number]  PCP:  Darreld Mclean, MD  Cardiologist:  Jenne Campus, MD    Referring MD: Darreld Mclean, MD   Chief Complaint  Patient presents with  . 1 month follow up  Doing well  History of Present Illness:    [name] is a 43 y.o. female who initially was seen in our office because of atypical chest pain.  She did have a stress test stress test was negative now be concentrating more on risk factors modifications.  She will continue to smoke we spent at least 5 minutes today talking about options for quitting she is excited and hope she will be able to accomplish the goal meaning complete quitting.  We did talk also about palpitations that she described to have I wanted her to wear monitor however she said she does not have any palpitation right now eventually reach conclusion that probably one of the best things that we can do for her is that if she can get it her apple watch with ability to record EKG or Kardia.  Past Medical History:  Diagnosis Date  . Anemia   . Heart murmur    no problems  . Hemorrhoids   . Hot thyroid nodule 2009   needle bx negative  . Lung abscess ([place]) 01/15/2011  . Melanoma ([place])    stage 3 - mid back  . Menorrhagia   . Migraine headache   . Pituitary cyst ([place])   . 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 SVD (spontaneous vaginal delivery)    x 2  . Vaso vagal episode   . WEIGHT LOSS, ABNORMAL 06/24/2008   Qualifier: Diagnosis of  By: Tommy Medal MD, Neoma Laming: Diagnosis of  By: Walker Kehr MD, Patrick Jupiter      Past Surgical History:  Procedure Laterality Date  . BREAST SURGERY     augmentation  . BRONCHOSCOPY    . COLONOSCOPY    . DILATION AND CURETTAGE OF UTERUS N/A 10/22/2017   Procedure: DILATATION AND CURETTAGE;  Surgeon: Emily Filbert, MD;  Location: [facility];  Service: Gynecology;  Laterality: N/A;  . LUNG REMOVAL, PARTIAL Left 2010   upper left lobe   . MEDIASTINOSCOPY    . TUBAL LIGATION    . UPPER GI ENDOSCOPY    . wide excision     melanoma removed - mid back  . WISDOM TOOTH EXTRACTION      Current Medications: Current Meds  Medication Sig  . aspirin EC 81 MG tablet Take 1 tablet (81 mg total) by mouth daily.  Marland Kitchen ibuprofen (ADVIL,MOTRIN) 600 MG tablet Take 1 tablet (600 mg total) by mouth every 6 (six) hours as needed.     Allergies:   Patient has no known allergies.   Social History   Socioeconomic History  . Marital status: Married    Spouse name: Not on file  . Number of children: 2  . Years of education: Not on file  . Highest education level: Not on file  Occupational History    Employer: [employer]  Social Needs  . Financial resource strain: Not on file  . Food insecurity    Worry: Not on file    Inability: Not on file  . Transportation needs    Medical: Not on file    Non-medical: Not on file  Tobacco Use  . Smoking status:  Current Every Day Smoker    Packs/day: 0.50    Years: 23.00    Pack years: 11.50    Types: Cigarettes  . Smokeless tobacco: Never Used  Substance and Sexual Activity  . Alcohol use: Yes    Comment: Occasional  . Drug use: No    Types: Marijuana    Comment: last use Sat/Sun 3/30-3/31  . Sexual activity: Yes    Birth control/protection: Surgical  Lifestyle  . Physical activity    Days per week: Not on file    Minutes per session: Not on file  . Stress: Not on file  Relationships  . Social Herbalist on phone: Not on file    Gets together: Not on file    Attends religious service: Not on file    Active member of club or organization: Not on file    Attends meetings of clubs or organizations: Not on file    Relationship status: Not on file  Other Topics Concern  . Not on file  Social History Narrative  . Not on file     Family History: The patient's family history includes Adrenal disorder in her sister; CAD in her sister; COPD in her mother; Coronary artery  disease in her unknown relative; Diabetes in her father; Heart attack in her sister; Heart disease in her father, mother, sister, and sister; Heart murmur in her daughter; Lymphoma in her unknown relative; Pectus carinatum in her son; Supraventricular tachycardia in her sister. ROS:   Please see the history of present illness.    All 14 point review of systems negative except as described per history of present illness  EKGs/Labs/Other Studies Reviewed:      Recent Labs: No results found for requested labs within last 8760 hours.  Recent Lipid Panel    Component Value Date/Time   CHOL 171 04/28/2019 1630   TRIG 76 04/28/2019 1630   HDL 62 04/28/2019 1630   CHOLHDL 2.8 04/28/2019 1630   CHOLHDL 3 04/15/2017 1017   VLDL 14.2 04/15/2017 1017   LDLCALC 95 04/28/2019 1630    Physical Exam:    VS:  BP 130/62   Pulse (!) 102   Ht 5\' 5"  (1.651 m)   Wt 122 lb 12.8 oz (55.7 kg)   SpO2 98%   BMI 20.43 kg/m     Wt Readings from Last 3 Encounters:  05/22/19 122 lb 12.8 oz (55.7 kg)  04/30/19 126 lb (57.2 kg)  04/28/19 126 lb (57.2 kg)     GEN:  Well nourished, well developed in no acute distress HEENT: Normal NECK: No JVD; No carotid bruits LYMPHATICS: No lymphadenopathy CARDIAC: RRR, no murmurs, no rubs, no gallops RESPIRATORY:  Clear to auscultation without rales, wheezing or rhonchi  ABDOMEN: Soft, non-tender, non-distended MUSCULOSKELETAL:  No edema; No deformity  SKIN: Warm and dry LOWER EXTREMITIES: no swelling NEUROLOGIC:  Alert and oriented x 3 PSYCHIATRIC:  Normal affect   ASSESSMENT:    1. Palpitations   2. Smoking   3. Atypical chest pain   4. Claudication in peripheral vascular disease ([place])    PLAN:    In order of problems listed above:  1. Palpitations again plan as outlined above she will get device that will allow her to record EKG 2. Smoking at least 5 minutes spent to instruct how to quit smoking 3. Atypical chest pain stress test negative  probably pain is related to palpitations 4. Peripheral vascular disease.  She does have dyslipidemia with  LDL of 95 I think she can benefit from small dose of statin I will put her on Lipitor 10.  In about 6 weeks we will recheck fasting lipid profile   Medication Adjustments/Labs and Tests Ordered: Current medicines are reviewed at length with the patient today.  Concerns regarding medicines are outlined above.  No orders of the defined types were placed in this encounter.  Medication changes: No orders of the defined types were placed in this encounter.   Signed, Park Liter, MD, [facility] 05/22/2019 3:58 PM    [place]

## 2019-08-04 LAB — LIPID PANEL
Chol/HDL Ratio: 2.2 ratio (ref 0.0–4.4)
Cholesterol, Total: 134 mg/dL (ref 100–199)
HDL: 61 mg/dL (ref >39)
LDL Chol Calc (NIH): 59 mg/dL (ref 0–99)
Triglycerides: 69 mg/dL (ref 0–149)
VLDL Cholesterol Cal: 14 mg/dL (ref 5–40)

## 2019-08-04 LAB — ALT: ALT: 12 IU/L (ref 0–32)

## 2019-08-04 LAB — AST: AST: 15 IU/L (ref 0–40)

## 2020-02-19 ENCOUNTER — Other Ambulatory Visit: Payer: Self-pay | Admitting: Cardiology

## 2020-06-02 ENCOUNTER — Other Ambulatory Visit: Payer: Self-pay | Admitting: Cardiology

## 2020-07-22 ENCOUNTER — Telehealth: Payer: Self-pay | Admitting: Cardiology

## 2020-07-22 ENCOUNTER — Other Ambulatory Visit: Payer: Self-pay

## 2020-07-22 DIAGNOSIS — D649 Anemia, unspecified: Secondary | ICD-10-CM | POA: Insufficient documentation

## 2020-07-22 DIAGNOSIS — E236 Other disorders of pituitary gland: Secondary | ICD-10-CM | POA: Insufficient documentation

## 2020-07-22 DIAGNOSIS — R7611 Nonspecific reaction to tuberculin skin test without active tuberculosis: Secondary | ICD-10-CM | POA: Insufficient documentation

## 2020-07-22 DIAGNOSIS — N920 Excessive and frequent menstruation with regular cycle: Secondary | ICD-10-CM | POA: Insufficient documentation

## 2020-07-22 DIAGNOSIS — G43909 Migraine, unspecified, not intractable, without status migrainosus: Secondary | ICD-10-CM | POA: Insufficient documentation

## 2020-07-22 DIAGNOSIS — R55 Syncope and collapse: Secondary | ICD-10-CM | POA: Insufficient documentation

## 2020-07-22 DIAGNOSIS — R011 Cardiac murmur, unspecified: Secondary | ICD-10-CM | POA: Insufficient documentation

## 2020-07-22 DIAGNOSIS — C439 Malignant melanoma of skin, unspecified: Secondary | ICD-10-CM | POA: Insufficient documentation

## 2020-07-25 ENCOUNTER — Encounter: Payer: Self-pay | Admitting: Cardiology

## 2020-07-25 ENCOUNTER — Other Ambulatory Visit: Payer: Self-pay

## 2020-07-25 ENCOUNTER — Ambulatory Visit (INDEPENDENT_AMBULATORY_CARE_PROVIDER_SITE_OTHER): Payer: Self-pay | Admitting: Cardiology

## 2020-07-25 VITALS — BP 198/70 | HR 91 | Ht 65.0 in | Wt 126.0 lb

## 2020-07-25 DIAGNOSIS — R002 Palpitations: Secondary | ICD-10-CM

## 2020-07-25 DIAGNOSIS — R0789 Other chest pain: Secondary | ICD-10-CM

## 2020-07-25 DIAGNOSIS — F17200 Nicotine dependence, unspecified, uncomplicated: Secondary | ICD-10-CM

## 2020-07-25 MED ORDER — ATORVASTATIN CALCIUM 10 MG PO TABS: 10.0000 mg | ORAL_TABLET | Freq: Every day | ORAL | 1 refills | Status: DC

## 2020-07-25 NOTE — Progress Notes (Signed)
Cardiology Office Note:    Date:  07/25/2020   ID:  [name], DOB Jan 27, 1976, MRN [id number]  PCP:  Darreld Mclean, MD  Cardiologist:  Jenne Campus, MD    Referring MD: Darreld Mclean, MD   Chief Complaint  Patient presents with  . Medication Refill  . Tachycardia    History of Present Illness:    [name] is a 44 y.o. female with past medical history significant for chronic smoking which is still ongoing, she also had atypical chest pain.  In 2019 we ended up doing stress echocardiogram which was negative and then a few years later she required another stress test for the same reason which was negative.  She comes today 2 months for follow-up.  Overall doing well.  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  Denies have any chest pain tightness squeezing pressure when she has shortness of breath is still there but she does have this issue with lung abscess previously on top of that she continues to smoke.  Past Medical History:  Diagnosis Date  . Anemia   . Atypical chest pain 09/22/2008   24 Holter Monitor showing NSR while patient endorsing symptoms.    . Back pain 02/13/2012  . Claudication in peripheral vascular disease ([place]) 05/24/2017  . DYSPLASTIC NEVUS, BACK 06/24/2008   Qualifier: Diagnosis of  By: Tommy Medal MD, Roderic Scarce    . Elevated blood pressure reading without diagnosis of hypertension 01/25/2012  . Episodic tension type headache 07/21/2013  . Family history of coronary artery disease 12/05/2010  . FATIGUE 05/18/2010   Qualifier: Diagnosis of  By: Walker Kehr MD, Patrick Jupiter    . Headache(784.0) 01/25/2012  . Heart murmur    no problems  . Hemorrhoids   . History of melanoma 04/23/2017  . Hot thyroid nodule 2009   needle bx negative  . Irregular menses 08/24/2010  . Lung abscess ([place]) 01/15/2011  . MALIGNANT MELANOMA SKIN TRUNK EXCEPT SCROTUM 09/22/2008   Qualifier: Diagnosis of  By: Tommy Medal MD, Roderic Scarce    . Melanoma  ([place])    stage 3 - mid back  . Menorrhagia   . Migraine headache   . Palpitations 05/18/2010   Qualifier: Diagnosis of  By: Walker Kehr MD, Patrick Jupiter    . Paroxysmal hypertension 07/21/2013  . Pituitary cyst ([place])   . Pneumonia, organism unspecified([place]) 11/09/2008   Formatting of this note might be different from the original. Pneumonia  . 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 Smoking 05/24/2017  . Solitary pulmonary nodule 06/24/2008    LUL 18 mm nodule - regressed to 5mm 04/2014, resolved 07/2015    . SVD (spontaneous vaginal delivery)    x 2  . 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 Tobacco abuse counseling 06/23/2008  . Vaso vagal episode   . WEIGHT LOSS, ABNORMAL 06/24/2008   Qualifier: Diagnosis of  By: Tommy Medal MD, Neoma Laming: Diagnosis of  By: Walker Kehr MD, Patrick Jupiter      Past Surgical History:  Procedure Laterality Date  . BREAST SURGERY     augmentation  . BRONCHOSCOPY    . COLONOSCOPY    . DILATION AND CURETTAGE OF UTERUS N/A 10/22/2017   Procedure: DILATATION AND CURETTAGE;  Surgeon: Emily Filbert, MD;  Location: [facility];  Service: Gynecology;  Laterality: N/A;  . LUNG REMOVAL, PARTIAL Left 2010   upper  left lobe  . MEDIASTINOSCOPY    . TUBAL LIGATION    . UPPER GI ENDOSCOPY    . wide excision     melanoma removed - mid back  . WISDOM TOOTH EXTRACTION      Current Medications: Current Meds  Medication Sig  . Aspirin-Salicylamide-Caffeine (BC HEADACHE PO) Take by mouth.  Marland Kitchen atorvastatin (LIPITOR) 10 MG tablet Take 1 tablet by mouth once daily  . clobetasol cream (TEMOVATE) 6.22 % Apply 1 application topically daily.  Marland Kitchen ibuprofen (ADVIL,MOTRIN) 600 MG tablet Take 1 tablet (600 mg total) by mouth every 6 (six) hours as needed.     Allergies:   Patient has no known allergies.   Social History   Socioeconomic History  . Marital status: Married    Spouse name: Not on file  . Number of children: 2  . Years of education: Not on file  . Highest education  level: Not on file  Occupational History    Employer: [employer]  Tobacco Use  . Smoking status: Current Every Day Smoker    Packs/day: 0.50    Years: 23.00    Pack years: 11.50    Types: Cigarettes  . Smokeless tobacco: Never Used  Vaping Use  . Vaping Use: Never used  Substance and Sexual Activity  . Alcohol use: Yes    Comment: Occasional  . Drug use: No    Types: Marijuana    Comment: last use Sat/Sun 3/30-3/31  . Sexual activity: Yes    Birth control/protection: Surgical  Other Topics Concern  . Not on file  Social History Narrative  . Not on file   Social Determinants of Health   Financial Resource Strain: Not on file  Food Insecurity: Not on file  Transportation Needs: Not on file  Physical Activity: Not on file  Stress: Not on file  Social Connections: Not on file     Family History: The patient's family history includes Adrenal disorder in her sister; CAD in her sister; COPD in her mother; Coronary artery disease in an other family member; Diabetes in her father; Heart attack in her sister; Heart disease in her father, mother, sister, and sister; Heart murmur in her daughter; Lymphoma in an other family member; Pectus carinatum in her son; Supraventricular tachycardia in her sister. ROS:   Please see the history of present illness.    All 14 point review of systems negative except as described per history of present illness  EKGs/Labs/Other Studies Reviewed:      Recent Labs: 08/03/2019: ALT 12  Recent Lipid Panel    Component Value Date/Time   CHOL 134 08/03/2019 0929   TRIG 69 08/03/2019 0929   HDL 61 08/03/2019 0929   CHOLHDL 2.2 08/03/2019 0929   CHOLHDL 3 04/15/2017 1017   VLDL 14.2 04/15/2017 1017   LDLCALC 59 08/03/2019 0929    Physical Exam:    VS:  BP (!) 198/70 (BP Location: Left Arm, Patient Position: Sitting)   Pulse 91   Ht 5\' 5"  (1.651 m)   Wt 126 lb (57.2 kg)   SpO2 98%   BMI 20.97 kg/m     Wt Readings from Last 3 Encounters:   07/25/20 126 lb (57.2 kg)  05/22/19 122 lb 12.8 oz (55.7 kg)  04/30/19 126 lb (57.2 kg)     GEN:  Well nourished, well developed in no acute distress HEENT: Normal NECK: No JVD; No carotid bruits LYMPHATICS: No lymphadenopathy CARDIAC: RRR, no murmurs, no rubs, no gallops RESPIRATORY:  Clear to auscultation without rales, wheezing or rhonchi  ABDOMEN: Soft, non-tender, non-distended MUSCULOSKELETAL:  No edema; No deformity  SKIN: Warm and dry LOWER EXTREMITIES: no swelling NEUROLOGIC:  Alert and oriented x 3 PSYCHIATRIC:  Normal affect   ASSESSMENT:    1. Atypical chest pain   2. Smoking   3. Palpitations    PLAN:    In order of problems listed above:  1. Atypical chest pain denies having any 2 stress test negative.  We will continue risk factors modifications. 2. Smoking obviously clearly the biggest risk factor for coronary artery disease and problems.  I spent a great deal of time talking to her again trying to convince her to quit.  She says she will try to work on that and she realizes a problem she tried all different measures to do it however so far unsuccessfully. 3. Palpitations: Denies having any. 4. Dyslipidemia I did review K PN I do have numbers from August 03, 2019 with LDL 59 HDL 61.  There is a good cholesterol she is on Lipitor however she ran out of this medications about 3 weeks ago.  I will ask her to restart Lipitor and then we will recheck her fasting lipid profile.   Medication Adjustments/Labs and Tests Ordered: Current medicines are reviewed at length with the patient today.  Concerns regarding medicines are outlined above.  No orders of the defined types were placed in this encounter.  Medication changes: No orders of the defined types were placed in this encounter.   Signed, Park Liter, MD, [facility] 07/25/2020 11:25 AM    [place]

## 2020-07-25 NOTE — Addendum Note (Signed)
Addended by: Senaida Ores on: 07/25/2020 11:31 AM   Modules accepted: Orders

## 2020-07-25 NOTE — Patient Instructions (Signed)
Medication Instructions:  Your physician recommends that you continue on your current medications as directed. Please refer to the Current Medication list given to you today.  *If you need a refill on your cardiac medications before your next appointment, please call your pharmacy*   Lab Work: Your physician recommends that you return for lab work in 6 weeks: lipid   If you have labs (blood work) drawn today and your tests are completely normal, you will receive your results only by: Marland Kitchen MyChart Message (if you have MyChart) OR . A paper copy in the mail If you have any lab test that is abnormal or we need to change your treatment, we will call you to review the results.   Testing/Procedures: None   Follow-Up: At [facility], you and your health needs are our priority.  As part of our continuing mission to provide you with exceptional heart care, we have created designated Provider Care Teams.  These Care Teams include your primary Cardiologist (physician) and Advanced Practice Providers (APPs -  Physician Assistants and Nurse Practitioners) who all work together to provide you with the care you need, when you need it.  We recommend signing up for the patient portal called "MyChart".  Sign up information is provided on this After Visit Summary.  MyChart is used to connect with patients for Virtual Visits (Telemedicine).  Patients are able to view lab/test results, encounter notes, upcoming appointments, etc.  Non-urgent messages can be sent to your provider as well.   To learn more about what you can do with MyChart, go to [email or web address].    Your next appointment:   1 year(s)  The format for your next appointment:   In Person  Provider:   Jenne Campus, MD   Other Instructions

## 2020-09-29 NOTE — Telephone Encounter (Signed)
Did not need this encounter °

## 2020-11-19 ENCOUNTER — Other Ambulatory Visit: Payer: Self-pay | Admitting: Cardiology

## 2021-02-17 ENCOUNTER — Ambulatory Visit (INDEPENDENT_AMBULATORY_CARE_PROVIDER_SITE_OTHER): Payer: Self-pay | Admitting: Internal Medicine

## 2021-02-17 ENCOUNTER — Encounter: Payer: Self-pay | Admitting: Internal Medicine

## 2021-02-17 ENCOUNTER — Other Ambulatory Visit: Payer: Self-pay

## 2021-02-17 VITALS — BP 122/82 | HR 104 | Temp 98.1°F | Resp 16 | Ht 65.0 in | Wt 125.4 lb

## 2021-02-17 DIAGNOSIS — J40 Bronchitis, not specified as acute or chronic: Secondary | ICD-10-CM

## 2021-02-17 MED ORDER — AZITHROMYCIN 250 MG PO TABS: ORAL_TABLET | ORAL | 0 refills | Status: DC

## 2021-02-17 NOTE — Patient Instructions (Addendum)
Please schedule a complete physical exam with [name] at your earliest convenience.  Take Zithromax as recommended  Get over-the-counter Robitussin-DM or Mucinex DM.  Take it as needed for cough  Drink plenty of fluids  Call you for gradually better  If you have severe symptoms including chest pain, high fever, difficulty breathing: Go to the ER.  Please consider get your first COVID-vaccine ASAP

## 2021-02-17 NOTE — Progress Notes (Signed)
Subjective:    Patient ID: [name], female    DOB: [birth year], 45 y.o.   MRN: [id number]  DOS:  02/17/2021 Type of visit - description: Acute Symptoms a started about 10 days ago: She was with 12 other people on a family vacation, she developed sinus congestion, raspy voice, runny nose. Since then she has been tested for COVID twice: Negative. 2 days ago her symptoms "went to the chest" and she started feeling some rattling and coughing greenish sputum. Denies hemoptysis   Review of Systems No fevers.  She felt some chills. No nausea or vomiting Had some myalgias at first , now resolved.  No wheezing  Past Medical History:  Diagnosis Date   Anemia    Atypical chest pain 09/22/2008   24 Holter Monitor showing NSR while patient endorsing symptoms.     Back pain 02/13/2012   Claudication in peripheral vascular disease ([place]) 05/24/2017   DYSPLASTIC NEVUS, BACK 06/24/2008   Qualifier: Diagnosis of  By: Tommy Medal MD, [place]     Elevated blood pressure reading without diagnosis of hypertension 01/25/2012   Episodic tension type headache 07/21/2013   Family history of coronary artery disease 12/05/2010   FATIGUE 05/18/2010   Qualifier: Diagnosis of  By: Walker Kehr MD, [place]     Headache(784.0) 01/25/2012   Heart murmur    no problems   Hemorrhoids    History of melanoma 04/23/2017   Hot thyroid nodule 2009   needle bx negative   Irregular menses 08/24/2010   Lung abscess ([place]) 01/15/2011   MALIGNANT MELANOMA SKIN TRUNK EXCEPT SCROTUM 09/22/2008   Qualifier: Diagnosis of  By: Tommy Medal MD, [place]     Melanoma [facility])    stage 3 - mid back   Menorrhagia    Migraine headache    Palpitations 05/18/2010   Qualifier: Diagnosis of  By: Walker Kehr MD, [place]     Paroxysmal hypertension 07/21/2013   Pituitary cyst ([place])    Pneumonia, organism unspecified(486) 11/09/2008   Formatting of this note might be different from the original. 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   No evidence of active TB     Tobacco abuse counseling 06/23/2008   Vaso vagal episode    WEIGHT LOSS, ABNORMAL 06/24/2008   Qualifier: Diagnosis of  By: Tommy Medal MD, [place] Laming: Diagnosis of  By: Walker Kehr MD, [place]      Past Surgical History:  Procedure Laterality Date   BREAST SURGERY     augmentation   BRONCHOSCOPY     COLONOSCOPY     DILATION AND CURETTAGE OF UTERUS N/A 10/22/2017   Procedure: DILATATION AND CURETTAGE;  Surgeon: Emily Filbert, MD;  Location: [facility];  Service: Gynecology;  Laterality: N/A;   LUNG REMOVAL, PARTIAL Left 2010   upper left lobe   MEDIASTINOSCOPY     TUBAL LIGATION     UPPER GI ENDOSCOPY     wide excision     melanoma removed - mid back   WISDOM TOOTH EXTRACTION      Allergies as of 02/17/2021   No Known Allergies      Medication List        Accurate as of February 17, 2021 11:59 PM. If you have any questions, ask your nurse or doctor.          atorvastatin 10 MG tablet Commonly known as: LIPITOR Take 1 tablet by mouth once daily   azithromycin 250 MG tablet Commonly known as: ZITHROMAX Take 2 tablets by mouth first day, then 1 tablet for 4 additional days Started by: Kathlene November, MD   [facility] HEADACHE PO Take by mouth.   clobetasol cream 0.05 % Commonly known as: TEMOVATE Apply 1 application topically daily.   ibuprofen 600 MG tablet Commonly known as: ADVIL Take 1 tablet (600 mg total) by mouth every 6 (six) hours as needed.           Objective:   Physical Exam BP 122/82 (BP Location: Left Arm, Patient Position: Sitting, Cuff Size: Small)   Pulse (!) 104   Temp 98.1 F (36.7 C) (Oral)   Resp 16   Ht 5\' 5"  (1.651 m)   Wt 125 lb 6 oz (56.9 kg)   SpO2 97%   BMI 20.86 kg/m  General:   Well developed, NAD, BMI noted. HEENT:  Normocephalic .  Face symmetric, atraumatic. TMs: Normal.  Throat: Symmetric, not red.  Neck: No LAD's. Lungs:  CTA B but + rhonchi with cough.  No wheezing. Normal respiratory effort, no intercostal retractions, no accessory muscle use. Heart: RRR,  no murmur.  Lower extremities: no pretibial edema bilaterally  Skin: Not pale. Not jaundice Neurologic:  alert & oriented X3.  Speech normal, gait appropriate for age and unassisted Psych--  Cognition and judgment appear intact.  Cooperative with normal attention span and concentration.  Behavior appropriate. No anxious or depressed appearing.      Assessment      45 year old female, PMH includes vasovagal episodes, pulmonary nodule, smoker, + PPD, history of pneumonia, 2010 partial lobectomy d/t infex, h/o  melanoma, presents with:  Bronchitis: Symptoms started 10 days ago as described above, 2 COVID test were negative. I am still suspicious that this could have been a mild case of COVID since she remains quite fatigued. Plan: Zithromax, Mucinex DM, rest and fluids, call if not better, see AVS. She is overdue for a checkup, encouraged to see PCP Also, strongly recommend to proceed with a COVID-vaccines.  Risk of severe illness or even death on unvaccinated patients discussed.    This visit occurred during the SARS-CoV-2 public health emergency.  Safety protocols were in place, including screening questions prior to the visit, additional usage of staff PPE, and extensive cleaning of exam room while observing appropriate contact time as indicated for disinfecting solutions.

## 2021-03-20 NOTE — Progress Notes (Addendum)
[facility] [place], [place], [place] [address] [phone]  Date:  03/22/2021   Name:  [name]   DOB:  Jan 03, 1976   MRN:  [id number]  PCP:  Darreld Mclean, MD    Chief Complaint: Annual Exam (Concerns/ questions: pt says she has rash on the back of head, neck and chest. Pt has a hx of cancer and would like to catch up on her health. Berner[place] Pho vaccines: no/Pap: ablation in 2018/Flu shot: declines)   History of Present Illness:  [name] is a 45 y.o. very pleasant female patient who presents with the following:  Patient agile today for a physical exam.  However, as it turns out she has several other concerns Most recent visit with myself was in October 2018 She saw Dr Larose Kells a month ago with bronchitis  History of hypertension, melanoma, lung abscess status post lobectomy in 2010 and family history of CAD, tobacco use  She was seen most recent by her cardiologist, Dr. Raliegh Ip in January: Atypical chest pain denies having any 2 stress test negative.  We will continue risk factors modifications. Smoking obviously clearly the biggest risk factor for coronary artery disease and problems.  I spent a great deal of time talking to her again trying to convince her to quit.  She says she will try to work on that and she realizes a problem she tried all different measures to do it however so far unsuccessfully. Palpitations: Denies having any. Dyslipidemia I did review K PN I do have numbers from August 03, 2019 with LDL 59 HDL 61.  There is a good cholesterol she is on Lipitor however she ran out of this medications about 3 weeks ago.  I will ask her to restart Lipitor and then we will recheck her fasting lipid profile.  COVID-19 vaccine- not done, recommended that she do this Can get pneumonia vaccine Colon cancer screening- never done.  She is not aware of any history of cancer but reports that her bowels have "drastically  changed"  Due for Pap- overdue per her GYN Mammogram- well overdue  Flu shot-recommended She had a lipid profile last year, otherwise no recent labs  Today pt notes that in 2012 she was having dizziness and was dx with a pituitary "cyst" on MRI- MRI brain 3/12: IMPRESSION:   No significant abnormality the brain.   2 mm nonenhancing pituitary cyst.   She did not have insurance at that time and was having to use the [employer] program" and some of her records/ labs seem to be missing She saw Jeanann Lewandowsky with endocrinology but apparently her labs could not be run so she did not get her results back -they wanted to check several things to determine if this pituitary cyst was functioning  Pt notes today that the skin on her neck seems to be flushed all the time She also tends to have petechiae on her legs-this is a long term issue  She works outdoors, spends quite a bit of time standing up and in the heat which could certainly trigger vasculitis  Her GYN had been Dr. Hulan Fray who is actually now retired  I mentioned colon cancer screening to her.  She is not aware of family history of colon cancer, but notes that she tends to have "explosive diarrhea" on a frequent basis.  I will refer her to GI for evaluation and colon cancer screening  Patient Active Problem List   Diagnosis Date Noted   Vaso vagal episode    SVD (spontaneous vaginal delivery)    Positive PPD    Pituitary cyst ([place])    Migraine headache    Menorrhagia    Melanoma ([place])    Heart murmur    Anemia    Claudication in peripheral vascular disease ([place]) 05/24/2017   Smoking 05/24/2017   History of melanoma 04/23/2017   Episodic tension type headache 07/21/2013   Paroxysmal hypertension 07/21/2013   Back pain 02/13/2012   Headache(784.0) 01/25/2012   Endocrine disorder 01/25/2012   Elevated blood pressure reading without diagnosis of hypertension 01/25/2012   Lung abscess ([place]) 01/15/2011   Family history of  coronary artery disease 12/05/2010   Dizziness 09/08/2010   FATIGUE 05/18/2010   PALPITATIONS 05/18/2010   Pneumonia, organism unspecified(486) 11/09/2008   MALIGNANT MELANOMA SKIN TRUNK EXCEPT SCROTUM 09/22/2008   Atypical chest pain 09/22/2008   DYSPLASTIC NEVUS, BACK 06/24/2008   Solitary pulmonary nodule 06/24/2008   TB lung, 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     Back pain 02/13/2012   Claudication in peripheral vascular disease ([place]) 05/24/2017   DYSPLASTIC NEVUS, BACK 06/24/2008   Qualifier: Diagnosis of  By: Tommy Medal MD, [place]     Elevated blood pressure reading without diagnosis of hypertension 01/25/2012   Episodic tension type headache 07/21/2013   Family history of coronary artery disease 12/05/2010   FATIGUE 05/18/2010   Qualifier: Diagnosis of  By: Walker Kehr MD, [place]     Headache(784.0) 01/25/2012   Heart murmur    no problems   Hemorrhoids    History of melanoma 04/23/2017   Hot thyroid nodule 2009   needle bx negative   Irregular menses 08/24/2010   Lung abscess ([place]) 01/15/2011   MALIGNANT MELANOMA SKIN TRUNK EXCEPT SCROTUM 09/22/2008   Qualifier: Diagnosis of  By: Tommy Medal MD, [place]     Melanoma [facility])    stage 3 - mid back   Menorrhagia    Migraine headache    Palpitations 05/18/2010   Qualifier: Diagnosis of  By: Walker Kehr MD, [place]     Paroxysmal hypertension 07/21/2013   Pituitary cyst ([place])    Pneumonia, organism unspecified(486) 11/09/2008   Formatting of this note might be different from the original. Pneumonia   Positive PPD    neg chest xray   Smoking 05/24/2017   Solitary pulmonary nodule 06/24/2008    LUL 18 mm nodule - regressed to 21mm 04/2014, resolved 07/2015     SVD (spontaneous vaginal delivery)    x 2   TB lung, latent  06/24/2008   No evidence of active TB     Tobacco abuse counseling 06/23/2008   Vaso vagal episode    WEIGHT LOSS, ABNORMAL 06/24/2008   Qualifier: Diagnosis of  By: Tommy Medal MD, Neoma Laming: Diagnosis of  By: Walker Kehr MD, [place]      Past Surgical History:  Procedure Laterality Date   BREAST SURGERY     augmentation   BRONCHOSCOPY     COLONOSCOPY     DILATION AND CURETTAGE OF UTERUS N/A 10/22/2017   Procedure: DILATATION AND CURETTAGE;  Surgeon: Emily Filbert, MD;  Location: [facility];  Service: Gynecology;  Laterality: N/A;   LUNG REMOVAL, PARTIAL Left 2010   upper left lobe   MEDIASTINOSCOPY     TUBAL LIGATION     UPPER GI ENDOSCOPY     wide excision     melanoma removed - mid back   WISDOM TOOTH EXTRACTION      Social History   Tobacco Use   Smoking status: Every Day    Packs/day: 0.50    Years: 23.00    Pack years: 11.50    Types: Cigarettes   Smokeless tobacco: Never  Vaping Use   Vaping Use: Never used  Substance Use Topics   Alcohol use: Yes    Comment: Occasional   Drug use: No    Types: Marijuana    Comment: last use Sat/Sun 3/30-3/31    Family History  Problem Relation Age of Onset   COPD Mother    Heart disease Mother    Heart disease Father    Diabetes Father    Heart disease Sister    Heart attack Sister    CAD Sister    Adrenal disorder Sister    Heart disease Sister    Supraventricular tachycardia Sister    Pectus carinatum Son    Heart murmur Daughter    Coronary artery disease Other    Lymphoma Other     No Known Allergies  Medication list has been reviewed and updated.  Current Outpatient Medications on File Prior to Visit  Medication Sig Dispense Refill   Aspirin-Salicylamide-Caffeine (BC HEADACHE PO) Take by mouth.     atorvastatin (LIPITOR) 10 MG tablet Take 1 tablet by mouth once daily 90 tablet 1   clobetasol cream (TEMOVATE) 1.61 % Apply 1 application topically daily.     ibuprofen (ADVIL,MOTRIN) 600 MG tablet Take 1 tablet  (600 mg total) by mouth every 6 (six) hours as needed. 30 tablet 0   No current facility-administered medications on file prior to visit.    Review of Systems:  As per HPI- otherwise negative.   Physical Examination: Vitals:   03/22/21 1054 03/22/21 1230  BP: 112/80   Pulse: (!) 111 90  Resp: 18   Temp: 98.1 F (36.7 C)   SpO2: 99%    Vitals:   03/22/21 1054  Weight: 127 lb 6.4 oz (57.8 kg)  Height: 5\' 2"  (1.575 m)   Body mass index is 23.3 kg/m. Ideal Body Weight: Weight in (lb) to have BMI = 25: 136.4  GEN: no acute distress.  Normal weight, looks well HEENT: Atraumatic, Normocephalic.  Bilateral TM wnl, oropharynx normal.  PEERL,EOMI.   Ears and Nose: No external deformity. CV: RRR, No M/G/R. No JVD. No thrill. No extra heart sounds. PULM: CTA B, no wheezes, crackles, rhonchi. No retractions. No resp. distress. No accessory muscle use. ABD: S, NT, ND, +BS. No rebound. No HSM. EXTR: No c/c/e PSYCH: Normally interactive. Conversant.  She has mild, diffuse petechiae on both legs from the knees down.  Appears most consistent with vasculitis The patient is in her anterior neck is flushed.  On exam I do not appreciate any particular abnormality here, the skin appears consistent with someone who is outdoors quite a bit. She also notes concern about an area on the back of her neck.  On exam she has a macular erythematous skin changes, nonspecific  Assessment and Plan: Pituitary cyst ([place]) - Plan: TSH, FSH, Prolactin, Estradiol, T4, free, Ambulatory referral to Endocrinology  Paroxysmal hypertension  Screening for hyperlipidemia - Plan: Lipid panel  Petechiae -  Plan: CBC, Sedimentation rate  Screening for diabetes mellitus - Plan: Comprehensive metabolic panel, Hemoglobin A1c  Screening for colon cancer - Plan: Ambulatory referral to Gastroenterology  Change in bowel function - Plan: Ambulatory referral to Gastroenterology  Patient seen today after a prolonged  absence from primary care.  Her main concern today is a pituitary cyst which was noted a decade ago.  She was not able to complete follow-up for this issue at the time, she would like to readdress this issue. I will do what labs we can today, some need to be done early in the morning.  Placed referral to endocrinology- Pt would like to see Dr Cruzita Lederer for endocrinology if possible.  I will also touch base with Dr. Cruzita Lederer about repeat MRI We discussed her health maintenance.  I encouraged her to establish with a new gynecologist, or I am also glad to manage her Pap screening mammograms for her  Referral to GI for colon cancer screening discussed change in bowel function This visit occurred during the SARS-CoV-2 public health emergency.  Safety protocols were in place, including screening questions prior to the visit, additional usage of staff PPE, and extensive cleaning of exam room while observing appropriate contact time as indicated for disinfecting solutions.    Signed Lamar Blinks, MD   Received her labs as below, message to patient Results for orders placed or performed in visit on 03/22/21  CBC  Result Value Ref Range   WBC 7.3 4.0 - 10.5 K/uL   RBC 4.41 3.87 - 5.11 Mil/uL   Platelets 186.0 150.0 - 400.0 K/uL   Hemoglobin 13.7 12.0 - 15.0 g/dL   HCT 40.8 36.0 - 46.0 %   MCV 92.6 78.0 - 100.0 fl   MCHC 33.6 30.0 - 36.0 g/dL   RDW 13.0 11.5 - 15.5 %  Comprehensive metabolic panel  Result Value Ref Range   Sodium 140 135 - 145 mEq/L   Potassium 4.0 3.5 - 5.1 mEq/L   Chloride 103 96 - 112 mEq/L   CO2 28 19 - 32 mEq/L   Glucose, Bld 91 70 - 99 mg/dL   BUN 13 6 - 23 mg/dL   Creatinine, Ser 0.74 0.40 - 1.20 mg/dL   Total Bilirubin 0.5 0.2 - 1.2 mg/dL   Alkaline Phosphatase 49 39 - 117 U/L   AST 18 0 - 37 U/L   ALT 15 0 - 35 U/L   Total Protein 7.1 6.0 - 8.3 g/dL   Albumin 4.5 3.5 - 5.2 g/dL   GFR 97.20 >60.00 mL/min   Calcium 9.4 8.4 - 10.5 mg/dL  Hemoglobin A1c  Result  Value Ref Range   Hgb A1c MFr Bld 5.6 4.6 - 6.5 %  TSH  Result Value Ref Range   TSH 0.63 0.35 - 5.50 uIU/mL  FSH  Result Value Ref Range   FSH 16.0 mIU/ML  T4, free  Result Value Ref Range   Free T4 0.79 0.60 - 1.60 ng/dL  Lipid panel  Result Value Ref Range   Cholesterol 144 0 - 200 mg/dL   Triglycerides 132.0 0.0 - 149.0 mg/dL   HDL 62.40 >39.00 mg/dL   VLDL 26.4 0.0 - 40.0 mg/dL   LDL Cholesterol 55 0 - 99 mg/dL   Total CHOL/HDL Ratio 2    NonHDL 81.45   Sedimentation rate  Result Value Ref Range   Sed Rate 3 0 - 20 mm/hr

## 2021-03-22 ENCOUNTER — Ambulatory Visit (INDEPENDENT_AMBULATORY_CARE_PROVIDER_SITE_OTHER): Payer: Self-pay | Admitting: Family Medicine

## 2021-03-22 ENCOUNTER — Other Ambulatory Visit: Payer: Self-pay

## 2021-03-22 ENCOUNTER — Encounter: Payer: Self-pay | Admitting: Family Medicine

## 2021-03-22 VITALS — BP 112/80 | HR 90 | Temp 98.1°F | Resp 18 | Ht 62.0 in | Wt 127.4 lb

## 2021-03-22 DIAGNOSIS — E236 Other disorders of pituitary gland: Secondary | ICD-10-CM

## 2021-03-22 DIAGNOSIS — Z1211 Encounter for screening for malignant neoplasm of colon: Secondary | ICD-10-CM

## 2021-03-22 DIAGNOSIS — Z13220 Encounter for screening for lipoid disorders: Secondary | ICD-10-CM

## 2021-03-22 DIAGNOSIS — R198 Other specified symptoms and signs involving the digestive system and abdomen: Secondary | ICD-10-CM

## 2021-03-22 DIAGNOSIS — Z131 Encounter for screening for diabetes mellitus: Secondary | ICD-10-CM

## 2021-03-22 DIAGNOSIS — I10 Essential (primary) hypertension: Secondary | ICD-10-CM

## 2021-03-22 DIAGNOSIS — R233 Spontaneous ecchymoses: Secondary | ICD-10-CM

## 2021-03-22 LAB — COMPREHENSIVE METABOLIC PANEL
ALT: 15 U/L (ref 0–35)
AST: 18 U/L (ref 0–37)
Albumin: 4.5 g/dL (ref 3.5–5.2)
Alkaline Phosphatase: 49 U/L (ref 39–117)
BUN: 13 mg/dL (ref 6–23)
CO2: 28 mEq/L (ref 19–32)
Calcium: 9.4 mg/dL (ref 8.4–10.5)
Chloride: 103 mEq/L (ref 96–112)
Creatinine, Ser: 0.74 mg/dL (ref 0.40–1.20)
GFR: 97.2 mL/min (ref >60.00)
Glucose, Bld: 91 mg/dL (ref 70–99)
Potassium: 4 mEq/L (ref 3.5–5.1)
Sodium: 140 mEq/L (ref 135–145)
Total Bilirubin: 0.5 mg/dL (ref 0.2–1.2)
Total Protein: 7.1 g/dL (ref 6.0–8.3)

## 2021-03-22 LAB — PROLACTIN: Prolactin: 3.6 ng/mL

## 2021-03-22 LAB — CBC
HCT: 40.8 % (ref 36.0–46.0)
Hemoglobin: 13.7 g/dL (ref 12.0–15.0)
MCHC: 33.6 g/dL (ref 30.0–36.0)
MCV: 92.6 fl (ref 78.0–100.0)
Platelets: 186 10*3/uL (ref 150.0–400.0)
RBC: 4.41 Mil/uL (ref 3.87–5.11)
RDW: 13 % (ref 11.5–15.5)
WBC: 7.3 10*3/uL (ref 4.0–10.5)

## 2021-03-22 LAB — LIPID PANEL
Cholesterol: 144 mg/dL (ref 0–200)
HDL: 62.4 mg/dL (ref >39.00)
LDL Cholesterol: 55 mg/dL (ref 0–99)
NonHDL: 81.45
Total CHOL/HDL Ratio: 2
Triglycerides: 132 mg/dL (ref 0.0–149.0)
VLDL: 26.4 mg/dL (ref 0.0–40.0)

## 2021-03-22 LAB — TSH: TSH: 0.63 u[IU]/mL (ref 0.35–5.50)

## 2021-03-22 LAB — SEDIMENTATION RATE: Sed Rate: 3 mm/hr (ref 0–20)

## 2021-03-22 LAB — ESTRADIOL: Estradiol: 71 pg/mL

## 2021-03-22 LAB — FOLLICLE STIMULATING HORMONE: FSH: 16 m[IU]/mL

## 2021-03-22 LAB — HEMOGLOBIN A1C: Hgb A1c MFr Bld: 5.6 % (ref 4.6–6.5)

## 2021-03-22 LAB — T4, FREE: Free T4: 0.79 ng/dL (ref 0.60–1.60)

## 2021-03-22 NOTE — Patient Instructions (Signed)
Good to see you again- I will be in touch with your labs asap I will touch base with Dr Cruzita Lederer about your seeing her, and see if she wants me to order a repeat MRI for you Dr Hulan Fray is retired!  You can certainly see the GYN of your choice, or I can take care of your pap and mammogram for you if you prefer  Please get your covid 19 vaccines and your flu shot asap  We will refer you to GI for your screening colonoscopy and to discuss your diarrhea symptoms

## 2021-03-23 ENCOUNTER — Encounter: Payer: Self-pay | Admitting: Family Medicine

## 2021-04-26 ENCOUNTER — Ambulatory Visit: Payer: Self-pay | Admitting: Internal Medicine

## 2021-05-23 ENCOUNTER — Ambulatory Visit (AMBULATORY_SURGERY_CENTER): Payer: Self-pay | Admitting: *Deleted

## 2021-05-23 ENCOUNTER — Other Ambulatory Visit: Payer: Self-pay

## 2021-05-23 VITALS — Ht 62.0 in | Wt 127.0 lb

## 2021-05-23 DIAGNOSIS — Z1211 Encounter for screening for malignant neoplasm of colon: Secondary | ICD-10-CM

## 2021-05-23 MED ORDER — PLENVU 140 G PO SOLR: 1.0000 | Freq: Once | ORAL | 0 refills | Status: AC

## 2021-05-23 NOTE — Progress Notes (Signed)
Patient's pre-visit was done today over the phone with the patient. Name,DOB and address verified. Patient denies any allergies to Eggs and Soy. Patient denies any problems with anesthesia/sedation. Patient is not taking any diet pills or blood thinners. No home Oxygen. Packet of Prep instructions mailed to patient including a copy of a consent form-pt is aware. Patient understands to call us back with any questions or concerns. Patient is aware of our care-partner policy and [id number] safety protocol.   EMMI education assigned to the patient for the procedure, sent to [place]. Patient will have insurance in Jan. 2023, pt will download insurance card in [place]. Patient aware to call us if she has any medical changes before procedure.

## 2021-05-31 ENCOUNTER — Encounter: Payer: Self-pay | Admitting: Internal Medicine

## 2021-05-31 ENCOUNTER — Ambulatory Visit (INDEPENDENT_AMBULATORY_CARE_PROVIDER_SITE_OTHER): Payer: Self-pay | Admitting: Internal Medicine

## 2021-05-31 ENCOUNTER — Other Ambulatory Visit: Payer: Self-pay

## 2021-05-31 VITALS — BP 120/82 | HR 106 | Ht 62.0 in | Wt 127.0 lb

## 2021-05-31 DIAGNOSIS — E236 Other disorders of pituitary gland: Secondary | ICD-10-CM

## 2021-05-31 DIAGNOSIS — E042 Nontoxic multinodular goiter: Secondary | ICD-10-CM

## 2021-05-31 DIAGNOSIS — R21 Rash and other nonspecific skin eruption: Secondary | ICD-10-CM

## 2021-05-31 LAB — CORTISOL: Cortisol, Plasma: 7.4 ug/dL

## 2021-05-31 NOTE — Progress Notes (Signed)
Patient ID: [name], female   DOB: 1976/05/08, 45 y.o.   MRN: [id number]  This visit occurred during the SARS-CoV-2 public health emergency.  Safety protocols were in place, including screening questions prior to the visit, additional usage of staff PPE, and extensive cleaning of exam room while observing appropriate contact time as indicated for disinfecting solutions.   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 She is the mother of [name], also my pt.  Pt. recently established care with [name].  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  She did not have insurance at that time so was not able to follow-up with another for this.    She was referred to see Dr. Jeanann Lewandowsky afterwards and had labs drawn at that time, however, the results were lost.  Per review of records brought by patient, it was mentioned that at that time she had a low ACTH and a normal cortisol, but I do not have the actual values. She recently requested a referral to endocrinology for further investigation.  [name] checked thyroid tests, FSH, prolactin, and estrogen and these were all normal.  Pt mentions: - headaches -"all the time" - on Goody's, BC powder, Ibuprofen, Tylenol - regular menses. She had an ablation /D and C for adenomyosis; h/o heavy menses - was on OCPs before  No: - weight loss - weight gain - increase in ring or shoe size - galactorrhea - fatigue - constipation - hair loss  She has dry skin on hands, rash on neck and chest, and rash on the back of her neck. Uses a steroid cream on hands- last dose yesterday.  I reviewed previous investigation: MRI brain/neck (05/19/2008): (pertinent summary): Clinical Data:  45 year old female with tuberculosis  exposure.  Weight loss, weakness and neck pain.   Evaluate for venous  tuberculosis.      IMPRESSION:     1.  Normal MRI appearance of the brain.  2.  Minor mastoid air cell inflammatory changes probably associated  with adenoid and tonsillar soft tissue hypertrophy which may be  sequelae of recent upper respiratory tract infection.     MRI CERVICAL SPINE WITHOUT AND WITH CONTRAST:  There are two sub centimeter T2 hyperintense nodules  in the right thyroid lobe measuring 5-6 mm.  Visualized thyroid  parenchyma and other paraspinal soft tissues are within normal  limits.  Multiple non-enlarged cervical lymph nodes, within normal  limits for age.       IMPRESSION:     1.  Normal cervical spinal cord.  Normal cervical spine for age  aside straightening of lordosis which may be positional or related  to muscle spasm.  2.  Two sub centimeter right thyroid lobe nodules are too small for  histologic sampling.  Consider a 12 month follow-up thyroid  ultrasound.    MRI brain (09/14/2010): Clinical Data: Headache and dizziness.  Positive pregnancy test but  not pregnant.  Evaluate pituitary.  Findings: Dynamic pituitary protocol was performed with thin  sections through the sella.  The patient has braces and with  artifact overlying the frontal lobes.  This causes slight  degradation of images involving the   pituitary which remains  adequately evaluated.   Ventricle size is normal.  Negative for acute or chronic  infarct.  Brainstem is normal.  No intracranial hemorrhage or mass.   The pituitary is normal in size measuring 6 mm in height.  2 mm  well circumscribed  nonenhancing  hypodensity in the superior  pituitary gland just  behind the infundibulum.  This appears to  represent a small pituitary cyst.  The remainder of the pituitary  gland enhances homogeneously.  The infundibulum is normal in the  midline.  There is no compression of the optic chiasm.  Cavernous  sinus is normal  bilaterally.   IMPRESSION:   No significant abnormality the brain.  2 mm nonenhancing pituitary cyst.     PET scan (12/19/2010): PET images demonstrate no abnormal activity within head or neck.   2022: Ophthalmology exam (Dr. Alois Cliche): borderline eye pressure (22), but no visual field cuts reportedly  Thyroid nodules.    Reviewed previous investigation: Thyroid U/S (08/15/2009):  At that time, she reportedly had a thyroid nodule biopsy of the dominant nodule and this was benign.  Reviewed pertinent tests: Component     Latest Ref Rng & Units 03/22/2021  TSH     0.35 - 5.50 uIU/mL 0.63  FSH     mIU/ML 16.0  Prolactin     ng/mL 3.6  Estradiol     pg/mL 71  T4,Free(Direct)     0.60 - 1.60 ng/dL 0.79   Previous TFTs have also been normal: Lab Results  Component Value Date   TSH 0.41 04/15/2017   TSH 1.13 09/17/2016   TSH 0.77 12/26/2015   TSH 1.249 01/15/2012   TSH 0.513 08/28/2010   TSH 0.670 05/18/2010   TSH 0.751 05/20/2008   FREET4 0.88 12/26/2015    Her daughter has a history of papillary thyroid cancer. Her mother had 2 pituitary tumors - PRL-producing.  Pt. 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  ROS: Constitutional: no weight gain/loss, no fatigue, + hot flashes Eyes: no blurry vision, no xerophthalmia ENT: no sore throat, no dysphagia/odynophagia, no hoarseness Cardiovascular: no CP/SOB/+ palpitations/no leg swelling Respiratory: no cough/SOB Gastrointestinal: no N/V/D/C Musculoskeletal: no muscle/joint aches Skin: + Rashes -see HPI Neurological: no tremors/numbness/tingling/dizziness, + headaches Psychiatric: no depression/anxiety + Low libido  Past Medical History:  Diagnosis Date   Anemia    Atypical chest pain 09/22/2008   24 Holter Monitor showing NSR while  patient endorsing symptoms.     Back pain 02/13/2012   Claudication in peripheral vascular disease ([place]) 05/24/2017   DYSPLASTIC NEVUS, BACK 06/24/2008   Qualifier: Diagnosis of  By: Tommy Medal MD, [place]     Elevated blood pressure reading without diagnosis of hypertension 01/25/2012   Episodic tension type headache 07/21/2013   Family history of coronary artery disease 12/05/2010   FATIGUE 05/18/2010   Qualifier: Diagnosis of  By: Walker Kehr MD, [place]     Headache(784.0) 01/25/2012   Heart murmur    no problems   Hemorrhoids    History of melanoma 04/23/2017   Hot thyroid nodule 2009   needle bx negative   Irregular menses 08/24/2010   Lung abscess ([place]) 01/15/2011   MALIGNANT MELANOMA SKIN TRUNK EXCEPT SCROTUM 09/22/2008   Qualifier: Diagnosis of  By: Tommy Medal MD, [place]     Melanoma [facility])    stage 3 - mid back   Menorrhagia    Migraine headache  Palpitations 05/18/2010   Qualifier: Diagnosis of  By: Walker Kehr MD, [place]     Paroxysmal hypertension 07/21/2013   Pituitary cyst ([place])    Pneumonia, organism unspecified(486) 11/09/2008   Formatting of this note might be different from the original. Pneumonia   Positive PPD    neg chest xray   Smoking 05/24/2017   Solitary pulmonary nodule 06/24/2008    LUL 18 mm nodule - regressed to 15mm 04/2014, resolved 07/2015     SVD (spontaneous vaginal delivery)    x 2   TB lung, latent 06/24/2008   No evidence of active TB     Tobacco abuse counseling 06/23/2008   Vaso vagal episode    WEIGHT LOSS, ABNORMAL 06/24/2008   Qualifier: Diagnosis of  By: Tommy Medal MD, Neoma Laming: Diagnosis of  By: Walker Kehr MD, [place]     Past Surgical History:  Procedure Laterality Date   BREAST SURGERY     augmentation   BRONCHOSCOPY     COLONOSCOPY  2010   in [place], [place]-normal exam   DILATION AND CURETTAGE OF UTERUS N/A 10/22/2017   Procedure: [place];  Surgeon: Emily Filbert, MD;  Location: [facility];  Service: Gynecology;  Laterality: N/A;   LUNG  REMOVAL, PARTIAL Left 2010   upper left lobe   MEDIASTINOSCOPY     TUBAL LIGATION     UPPER GI ENDOSCOPY  2010   in [place], [place]-normal   wide excision     melanoma removed - mid back   WISDOM TOOTH EXTRACTION     Social History   Socioeconomic History   Marital status: Married    Spouse name: Not on file   Number of children: 2   Years of education: Not on file   Highest education level: Not on file  Occupational History    Employer: [employer]   Occupation: Builds swimming pools  Tobacco Use   Smoking status: Every Day    Packs/day: 1.00    Years: 23.00    Pack years: 23.00    Types: Cigarettes   Smokeless tobacco: Never  Vaping Use   Vaping Use: Never used  Substance and Sexual Activity   Alcohol use: Not Currently    Comment: Occasional   Drug use: No    Types: Marijuana    Comment: pt denies   Sexual activity: Yes    Birth control/protection: Surgical  Other Topics Concern   Not on file  Social History Narrative   Not on file   Social Determinants of Health   Financial Resource Strain: Not on file  Food Insecurity: Not on file  Transportation Needs: Not on file  Physical Activity: Not on file  Stress: Not on file  Social Connections: Not on file  Intimate Partner Violence: Not on file   Current Outpatient Medications on File Prior to Visit  Medication Sig Dispense Refill   Aspirin-Salicylamide-Caffeine (BC HEADACHE PO) Take by mouth.     atorvastatin (LIPITOR) 10 MG tablet Take 1 tablet by mouth once daily 90 tablet 1   clobetasol cream (TEMOVATE) 1.49 % Apply 1 application topically daily.     ibuprofen (ADVIL,MOTRIN) 600 MG tablet Take 1 tablet (600 mg total) by mouth every 6 (six) hours as needed. 30 tablet 0   No current facility-administered medications on file prior to visit.   No Known Allergies Family History  Problem Relation Age of Onset   COPD Mother    Heart disease Mother    Heart  disease Father    Diabetes Father    Heart  attack Father    Heart disease Sister    Heart attack Sister    CAD Sister    Adrenal disorder Sister    Heart disease Sister    Supraventricular tachycardia Sister    Heart murmur Daughter    Pectus carinatum Son    Coronary artery disease Other    Lymphoma Other    Colon cancer Neg Hx    PE: BP 120/82 (BP Location: Right Arm, Patient Position: Sitting, Cuff Size: Normal)   Pulse (!) 106   Ht 5\' 2"  (1.575 m)   Wt 127 lb (57.6 kg)   SpO2 97%   BMI 23.23 kg/m  Wt Readings from Last 3 Encounters:  05/31/21 127 lb (57.6 kg)  05/23/21 127 lb (57.6 kg)  03/22/21 127 lb 6.4 oz (57.8 kg)   Constitutional: normal weight, in NAD Eyes: PERRLA, EOMI, no exophthalmos ENT: moist mucous membranes, no thyromegaly, no cervical lymphadenopathy Cardiovascular: Tachycardia, RR, No MRG Respiratory: CTA B Gastrointestinal: abdomen soft, NT, ND, BS+ Musculoskeletal: no deformities, strength intact in all 4 Skin: moist, warm, + rash on chest, neck but not face and also at the post hairline Neurological: no tremor with outstretched hands, DTR normal in all 4  ASSESSMENT: 1. Pituitary cyst  2.  Thyroid nodules  3.  Rash  PLAN:  1. Patient with a small, 2 mm, pituitary cyst, found 10 years ago during investigation for repeat a positive pregnancy tests in the setting of bilateral tubal ligation. - I reviewed the pituitary MRI images and report along with the patient. I explained that this is most likely an incidental finding, especially since there was no pituitary micro or macroadenoma visible on the MRI.  I did explain that some tumors may become cystic (for example prolactinoma), however, prolactin level was not recently high and the previous MRI did not show a pituitary mass. -Reviewing records brought by patient, I can see that Dr. Carlis Abbott was trying to investigate a low ACTH with normal cortisol level, but I do not have any results and she mentions that [facility] threw away the lab tubes as the  orders were illegible and the labs were not drawn. -At this visit, we discussed that her pituitary cyst does not appear to be part of the tumor that is producing hormones.  These tumors can be producing: - Prolactin (prolactinoma) - however, she has regular menstrual cycles and no breast milk discharge.  Also, prolactin level is normal. - ACTH (Cushing's disease) - no weight gain, no central distribution of fat, no wide, purple, stretch marks, no full supraclavicular fat pads, also, no chronic conditions like: Diabetes or hypertension. - growth hormone (acromegaly) - she denies an enlarged jaw, change in the size of rings or changing shoe sizes - LH or FSH (gonadotropin secreting tumor) - her menstrual cycles are normal - TSH (TSH secreting tumor) (very rare) - recent thyroid tests were normal, as were previous thyroid tests reviewed as available in her chart - at this visit, we discussed about the fact that her [place], prolactin, estrogen, thyroid tests, were all normal.  We will not repeat this today.  I would like to check an IGF-I, ACTH, cortisol.  If these are normal, no further pulmonary investigation is needed. -Also, since she is complaining of constant headaches, we will repeat another pituitary MRI.  If the pituitary cyst is stable, no further investigation is needed.  2.  Thyroid nodules -These  were small per review of her thyroid ultrasound report from 2011.  Largest nodule was 1.5 cm, in the left thyroid lobe. -Reportedly, this was biopsied with benign results in the past. -At today's visit we reviewed previous investigation with PET scan for her history of melanoma.  I explained that the fact that there was no FDG uptake in the neck is a good prognosis time -we will check a thyroid ultrasound -if nodules are stable, no further investigation is needed for them  3. Rash - on chest, neck -She would like to be tested for carcinoid -Of note, she does not have diarrhea, wheezing, or other  general symptoms.  She does describe 2 cardiac valves affected by valvular insufficiency.  I did not hear a clear murmur on physical exam. -At today's visit, we will check a serotonin level.  If this is normal, I do not feel that we need further investigation for carcinoid, but if this is abnormal, we will need to check a 24-hour urine for 5 HIAA -At this visit we will also check a calcitonin and an IGF-I since medullary thyroid cancer and acromegaly can both present with a rash  Component     Latest Ref Rng & Units 05/31/2021  IGF-I, LC/MS     52 - 328 ng/mL 148  Z-Score (Female)     -2.0 - 2.0 SD 0.1  Growth Hormone     < OR = 7.1 ng/mL 2.9  C206 ACTH     6 - 50 pg/mL 10  Cortisol, Plasma     ug/dL 7.4  Serotonin, Serum     56 - 244 ng/mL 139  Calcitonin     <=5 pg/mL <2  All labs normal.  Philemon Kingdom, MD PhD [facility]

## 2021-05-31 NOTE — Patient Instructions (Addendum)
Please stop at the lab.  I will order a new thyroid ultrasound  and pituitary MRI for you.  Please come back for a follow-up appointment in 1 year.

## 2021-06-06 ENCOUNTER — Encounter: Payer: Self-pay | Admitting: Internal Medicine

## 2021-06-09 LAB — CALCITONIN: Calcitonin: <2 pg/mL (ref <=5)

## 2021-06-09 LAB — INSULIN-LIKE GROWTH FACTOR
IGF-I, LC/MS: 148 ng/mL (ref 52–328)
Z-Score (Female): 0.1 SD (ref ?–2.0)

## 2021-06-09 LAB — SEROTONIN SERUM: Serotonin, Serum: 139 ng/mL (ref 56–244)

## 2021-06-09 LAB — ACTH: C206 ACTH: 10 pg/mL (ref 6–50)

## 2021-06-09 LAB — GROWTH HORMONE: Growth Hormone: 2.9 ng/mL (ref <=7.1)

## 2021-07-19 ENCOUNTER — Encounter: Payer: Self-pay | Admitting: Family Medicine

## 2021-07-19 NOTE — Progress Notes (Signed)
[place] at [facility] [address], [place], [place] [address] [phone]  Date:  07/20/2021   Name:  [name]   DOB:  04/08/1976   MRN:  [id number]  PCP:  Darreld Mclean, MD    Chief Complaint: No chief complaint on file.   History of Present Illness:  [name] is a 46 y.o. very pleasant female patient who presents with the following:  Virtual visit today for concern of flulike symptoms Patient location is her car, my location is office.  Patient identity confirmed with 2 factors, she gives consent for virtual visit today.  The patient and myself are present on the visit today   Most recent visit with myself was in September History of hypertension, melanoma, lung abscess status post lobectomy in 2010 and family history of CAD, tobacco use  At her last visit she mentioned a pituitary cyst which had been seen on imaging several years prior.  I referred her to endocrinology and she was seen by Dr. Cruzita Lederer in November-it appears that she has a benign incidentally noted cyst  Pt thinks she likely has covid- They got exposed to covid over christmas when they attended a Christmas party and symptom numbers then tested positive Last week both her son and her husband tested positive for COVID  Pt herself noted sx of illness as of a week ago-however, she has not yet tested herself for COVID  She felt tired, achy, has felt warm but not checking her temperature She did vomit for one day a week ago-since resolved Her cough is productive of mucus  She feels tired and like she is getting bronchitis She is requesting a Z-Pak    Patient Active Problem List   Diagnosis Date Noted   Vaso vagal episode    SVD (spontaneous vaginal delivery)    Positive PPD    Pituitary cyst ([place])    Migraine headache    Menorrhagia    Melanoma ([place])    Heart murmur    Anemia    Claudication in peripheral vascular disease ([place]) 05/24/2017    Smoking 05/24/2017   History of melanoma 04/23/2017   Episodic tension type headache 07/21/2013   Paroxysmal hypertension 07/21/2013   Back pain 02/13/2012   Headache(784.0) 01/25/2012   Endocrine disorder 01/25/2012   Elevated blood pressure reading without diagnosis of hypertension 01/25/2012   Lung abscess ([place]) 01/15/2011   Family history of coronary artery disease 12/05/2010   Dizziness 09/08/2010   FATIGUE 05/18/2010   PALPITATIONS 05/18/2010   Pneumonia, organism unspecified([place]) 11/09/2008   MALIGNANT MELANOMA SKIN TRUNK EXCEPT SCROTUM 09/22/2008   Atypical chest pain 09/22/2008   DYSPLASTIC NEVUS, BACK 06/24/2008   Solitary pulmonary nodule 06/24/2008   TB lung, 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     Back pain 02/13/2012   Claudication in peripheral vascular disease ([place]) 05/24/2017   DYSPLASTIC NEVUS, BACK 06/24/2008   Qualifier: Diagnosis of  By: Tommy Medal MD, [place]     Elevated blood pressure reading without diagnosis of hypertension 01/25/2012   Episodic tension type headache 07/21/2013   Family history of coronary artery disease 12/05/2010   FATIGUE 05/18/2010   Qualifier: Diagnosis of  ByWalker Kehr MD, [place]     Headache(784.0) 01/25/2012   Heart murmur    no problems   Hemorrhoids    History of melanoma 04/23/2017   Hot thyroid nodule 2009   needle bx negative   Irregular menses 08/24/2010   Lung abscess ([place]) 01/15/2011   MALIGNANT MELANOMA SKIN TRUNK EXCEPT SCROTUM 09/22/2008   Qualifier: Diagnosis of  By: Tommy Medal MD, [place]     Melanoma [facility])    stage 3 - mid back   Menorrhagia    Migraine headache    Palpitations 05/18/2010   Qualifier: Diagnosis of  By: Walker Kehr MD, [place]     Paroxysmal hypertension 07/21/2013   Pituitary cyst  ([place])    Pneumonia, organism unspecified(486) 11/09/2008   Formatting of this note might be different from the original. Pneumonia   Positive PPD    neg chest xray   Smoking 05/24/2017   Solitary pulmonary nodule 06/24/2008    LUL 18 mm nodule - regressed to 26mm 04/2014, resolved 07/2015     SVD (spontaneous vaginal delivery)    x 2   TB lung, latent 06/24/2008   No evidence of active TB     Tobacco abuse counseling 06/23/2008   Vaso vagal episode    WEIGHT LOSS, ABNORMAL 06/24/2008   Qualifier: Diagnosis of  By: Tommy Medal MD, Neoma Laming: Diagnosis of  By: Walker Kehr MD, [place]      Past Surgical History:  Procedure Laterality Date   BREAST SURGERY     augmentation   BRONCHOSCOPY     COLONOSCOPY  2010   in [place], [place]-normal exam   DILATION AND CURETTAGE OF UTERUS N/A 10/22/2017   Procedure: DILATATION AND CURETTAGE;  Surgeon: Emily Filbert, MD;  Location: [facility];  Service: Gynecology;  Laterality: N/A;   LUNG REMOVAL, PARTIAL Left 2010   upper left lobe   MEDIASTINOSCOPY     TUBAL LIGATION     UPPER GI ENDOSCOPY  2010   in [place], [place]-normal   wide excision     melanoma removed - mid back   WISDOM TOOTH EXTRACTION      Social History   Tobacco Use   Smoking status: Every Day    Packs/day: 1.00    Years: 23.00    Pack years: 23.00    Types: Cigarettes   Smokeless tobacco: Never  Vaping Use   Vaping Use: Never used  Substance Use Topics   Alcohol use: Not Currently    Comment: Occasional   Drug use: No    Types: Marijuana    Comment: pt denies    Family History  Problem Relation Age of Onset   COPD Mother    Heart disease Mother    Heart disease Father    Diabetes Father    Heart attack Father    Heart disease Sister    Heart attack Sister    CAD Sister    Adrenal disorder Sister    Heart disease Sister    Supraventricular tachycardia Sister    Heart murmur Daughter    Thyroid cancer Daughter    Pectus carinatum Son    Coronary artery  disease Other    Lymphoma Other    Colon cancer Neg Hx     No Known Allergies  Medication list has been reviewed and updated.  Current Outpatient Medications on File Prior to Visit  Medication Sig Dispense Refill   Aspirin-Salicylamide-Caffeine (BC HEADACHE PO) Take by mouth.     atorvastatin (  LIPITOR) 10 MG tablet Take 1 tablet by mouth once daily 90 tablet 1   clobetasol cream (TEMOVATE) 2.19 % Apply 1 application topically daily.     ibuprofen (ADVIL,MOTRIN) 600 MG tablet Take 1 tablet (600 mg total) by mouth every 6 (six) hours as needed. 30 tablet 0   No current facility-administered medications on file prior to visit.    Review of Systems:  As per HPI- otherwise negative.   Physical Examination: There were no vitals filed for this visit. There were no vitals filed for this visit. There is no height or weight on file to calculate BMI. Ideal Body Weight:    Patient observed over video monitor.  She is nontoxic but does not appear to feel well.  No cough or distress is noted She is not currently checking any vital signs Assessment and Plan: COVID-19  Bronchitis - Plan: azithromycin (ZITHROMAX) 250 MG tablet  Pt seen today for a virtual visit for concern of illness. Video used for duration of visit today Patient likely has COVID-19, but has not actually tested for same.  Also, she has been ill for 7 or more days I did encourage her to test for COVID-19 for confirmation.  However at this point antivirals are unlikely to be helpful.  She is concerned about developing a secondary lung infection.  Will prescribe azithromycin as per her request  She will check her oxygen saturation when she gets home and will let me know if less than 92 to 95%.  She will also follow-up if not improving soon  Signed Lamar Blinks, MD

## 2021-07-20 ENCOUNTER — Telehealth: Payer: Self-pay | Admitting: Internal Medicine

## 2021-07-20 ENCOUNTER — Telehealth (INDEPENDENT_AMBULATORY_CARE_PROVIDER_SITE_OTHER): Payer: Self-pay | Admitting: Family Medicine

## 2021-07-20 DIAGNOSIS — U071 COVID-19: Secondary | ICD-10-CM

## 2021-07-20 DIAGNOSIS — J40 Bronchitis, not specified as acute or chronic: Secondary | ICD-10-CM

## 2021-07-20 MED ORDER — AZITHROMYCIN 250 MG PO TABS: ORAL_TABLET | ORAL | 0 refills | Status: AC

## 2021-07-20 NOTE — Telephone Encounter (Signed)
Hey Dr. Hilarie Fredrickson,   Inbound call from patient called to cancel to procedure 07/21/21 due to testing positive for COVID. States she will call back to reschedule.   Thank you

## 2021-07-21 ENCOUNTER — Encounter: Payer: Self-pay | Admitting: Internal Medicine

## 2021-08-02 ENCOUNTER — Other Ambulatory Visit: Payer: Self-pay

## 2021-10-06 ENCOUNTER — Other Ambulatory Visit: Payer: Self-pay | Admitting: Cardiology

## 2021-11-07 ENCOUNTER — Ambulatory Visit
Admission: RE | Admit: 2021-11-07 | Discharge: 2021-11-07 | Disposition: A | Discharge status: Home / Self Care | Payer: 59 | Source: Ambulatory Visit | Attending: Internal Medicine | Admitting: Internal Medicine

## 2021-11-07 DIAGNOSIS — E042 Nontoxic multinodular goiter: Secondary | ICD-10-CM | POA: Diagnosis not present

## 2021-11-07 DIAGNOSIS — E236 Other disorders of pituitary gland: Secondary | ICD-10-CM

## 2021-11-07 MED ORDER — GADOBENATE DIMEGLUMINE 529 MG/ML IV SOLN
12.0000 mL | Freq: Once | INTRAVENOUS | Status: AC | PRN
  Administered 2021-11-07: 12 mL via INTRAVENOUS

## 2021-11-09 ENCOUNTER — Encounter: Payer: Self-pay | Admitting: Internal Medicine

## 2021-11-10 ENCOUNTER — Other Ambulatory Visit: Payer: Self-pay | Admitting: Internal Medicine

## 2021-11-10 DIAGNOSIS — E042 Nontoxic multinodular goiter: Secondary | ICD-10-CM

## 2021-11-23 ENCOUNTER — Ambulatory Visit
Admission: RE | Admit: 2021-11-23 | Discharge: 2021-11-23 | Disposition: A | Discharge status: Home / Self Care | Payer: 59 | Source: Ambulatory Visit | Attending: Internal Medicine | Admitting: Internal Medicine

## 2021-11-23 ENCOUNTER — Other Ambulatory Visit (HOSPITAL_COMMUNITY)
Admission: RE | Admit: 2021-11-23 | Discharge: 2021-11-23 | Disposition: A | Discharge status: Home / Self Care | Payer: 59 | Source: Ambulatory Visit | Attending: Radiology | Admitting: Radiology

## 2021-11-23 DIAGNOSIS — E042 Nontoxic multinodular goiter: Secondary | ICD-10-CM

## 2021-11-23 DIAGNOSIS — E041 Nontoxic single thyroid nodule: Secondary | ICD-10-CM | POA: Diagnosis not present

## 2021-11-23 DIAGNOSIS — E0789 Other specified disorders of thyroid: Secondary | ICD-10-CM | POA: Diagnosis not present

## 2021-11-24 LAB — CYTOLOGY - NON PAP

## 2021-11-29 LAB — CYTOLOGY - NON PAP

## 2021-11-30 DIAGNOSIS — E042 Nontoxic multinodular goiter: Secondary | ICD-10-CM | POA: Diagnosis not present

## 2021-12-12 ENCOUNTER — Encounter: Payer: Self-pay | Admitting: Internal Medicine

## 2021-12-14 ENCOUNTER — Encounter (HOSPITAL_COMMUNITY): Payer: Self-pay

## 2022-03-28 ENCOUNTER — Telehealth: Payer: Self-pay | Admitting: Cardiology

## 2022-03-28 NOTE — Telephone Encounter (Signed)
Patient c/o Palpitations:  High priority if patient c/o lightheadedness, shortness of breath, or chest pain  How long have you had palpitations/irregular HR/ Afib? Are you having the symptoms now? Irregular HR has been occurring for the past year.   Are you currently experiencing lightheadedness, SOB or CP? No   Do you have a history of afib (atrial fibrillation) or irregular heart rhythm? No  Have you checked your BP or HR? (document readings if available): HR now 112  Are you experiencing any other symptoms? Left jaw pain since Sunday that goes into neck, teeth and tongue    Pt c/o of Chest Pain: STAT if CP now or developed within 24 hours  1. Are you having CP right now? No   2. Are you experiencing any other symptoms (ex. SOB, nausea, vomiting, sweating)? Irregular HR  3. How long have you been experiencing CP? Since Sunday afternoon   4. Is your CP continuous or coming and going? Coming and going  5. Have you taken Nitroglycerin? N/A   Patient's appt was rescheduled for tomorrow 09/14 with Jaquelyn Bitter  ?

## 2022-03-28 NOTE — Progress Notes (Signed)
Office Visit    Patient Name: [name] Date of Encounter: 03/29/2022  Primary Care Provider:  Darreld Mclean, MD Primary Cardiologist:  None Primary Electrophysiologist: None  Chief Complaint    [name] is a 46 y.o. female with PMH of HTN, lung abscess s/p post lobectomy 2010, tobacco use, premature family history of CAD, atypical chest pain, melanoma, migraines, heart murmur who presents today for complaint of jaw pain and irregular heartbeat.  Past Medical History    Past Medical History:  Diagnosis Date   Anemia    Atypical chest pain 09/22/2008   24 Holter Monitor showing NSR while patient endorsing symptoms.     Back pain 02/13/2012   Claudication in peripheral vascular disease ([place]) 05/24/2017   DYSPLASTIC NEVUS, BACK 06/24/2008   Qualifier: Diagnosis of  By: Tommy Medal MD, [place]     Elevated blood pressure reading without diagnosis of hypertension 01/25/2012   Episodic tension type headache 07/21/2013   Family history of coronary artery disease 12/05/2010   FATIGUE 05/18/2010   Qualifier: Diagnosis of  By: Walker Kehr MD, [place]     Headache(784.0) 01/25/2012   Heart murmur    no problems   Hemorrhoids    History of melanoma 04/23/2017   Hot thyroid nodule 2009   needle bx negative   Irregular menses 08/24/2010   Lung abscess ([place]) 01/15/2011   MALIGNANT MELANOMA SKIN TRUNK EXCEPT SCROTUM 09/22/2008   Qualifier: Diagnosis of  By: Tommy Medal MD, [place]     Melanoma [facility])    stage 3 - mid back   Menorrhagia    Migraine headache    Palpitations 05/18/2010   Qualifier: Diagnosis of  By: Walker Kehr MD, [place]     Paroxysmal hypertension 07/21/2013   Pituitary cyst ([place])    Pneumonia, organism unspecified(486) 11/09/2008   Formatting of this note might be different from the original. 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   No evidence of active TB     Tobacco abuse counseling 06/23/2008   Vaso vagal episode    WEIGHT LOSS, ABNORMAL 06/24/2008   Qualifier: Diagnosis of  By: Tommy Medal MD, Neoma Laming: Diagnosis of  By: Walker Kehr MD, [place]     Past Surgical History:  Procedure Laterality Date   BREAST SURGERY     augmentation   BRONCHOSCOPY     COLONOSCOPY  2010   in [place], [place]-normal exam   DILATION AND CURETTAGE OF UTERUS N/A 10/22/2017   Procedure: DILATATION AND CURETTAGE;  Surgeon: Emily Filbert, MD;  Location: [facility];  Service: Gynecology;  Laterality: N/A;   LUNG REMOVAL, PARTIAL Left 2010   upper left lobe   MEDIASTINOSCOPY     TUBAL LIGATION     UPPER GI ENDOSCOPY  2010   in [place], [place]-normal   wide excision     melanoma removed - mid back   WISDOM TOOTH EXTRACTION      Allergies  No Known Allergies  History of Present Illness    [name] has a PMH of is a 46 year old female with the above mention past medical history who presents today for jaw pain and irregular heartbeat.  Ms. Eatherly was  initially seen by Dr. Aundra Dubin in 2012 for complaint of palpitations and heart racing.  She was seen by Dr. Agustin Cree in 04/2017 for atypical chest pain that she described as radiating towards the neck and both arms.  She has a strong family history of premature coronary artery disease.  She also endorses pain when walking that occurs in her right leg event monitor was placed that revealed no abnormalities or arrhythmias.  2D echo was performed 05/2017 that revealed EF of 55-65%, grade 1 DD, no RWMA, with trivial TV regurgitation.  Patient also had a negative stress echo test performed.  ABI was also performed that was normal in both legs.  She contacted the nurse line with complaints of palpitations.  She also endorsed jaw pain as well.  Ms. Galiano presents today alone for post ED follow-up.  Since last being seen in the office patient reports  she is been experiencing jaw and neck pain along with chest discomfort.  She also endorses palpitations and states that she has captured a few beats of atrial fibrillation on her Apple watch watch.  She has been under a tremendous amount of stress lately due to the sudden death of her 56 year old sister.  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  She was seen recently at the ED at [facility] and had a full ACS work-up that was negative.  Patient denies chest pain, palpitations, dyspnea, PND, orthopnea, nausea, vomiting, dizziness, syncope, edema, weight gain, or early satiety.  Home Medications    Current Outpatient Medications  Medication Sig Dispense Refill   Aspirin-Salicylamide-Caffeine (BC HEADACHE PO) Take by mouth.     atorvastatin (LIPITOR) 10 MG tablet Take 1 tablet by mouth once daily 30 tablet 1   clobetasol cream (TEMOVATE) 4.13 % Apply 1 application topically daily.     ibuprofen (ADVIL,MOTRIN) 600 MG tablet Take 1 tablet (600 mg total) by mouth every 6 (six) hours as needed. 30 tablet 0   lisinopril (ZESTRIL) 10 MG tablet Take 1 tablet (10 mg total) by mouth daily. 90 tablet 3   metoprolol tartrate (LOPRESSOR) 100 MG tablet Take 1 tablet (100 mg total) by mouth once for 1 dose. 1 tablet 0   No current facility-administered medications for this visit.     Review of Systems  Please see the history of present illness.    (+) Neck and jaw pain (+) Palpitations  All other systems reviewed and are otherwise negative except as noted above.  Physical Exam    Wt Readings from Last 3 Encounters:  03/29/22 121 lb (54.9 kg)  05/31/21 127 lb (57.6 kg)  05/23/21 127 lb (57.6 kg)   VS: Vitals:   03/29/22 1354  BP: (!) 140/84  Pulse: 92  ,Body mass index is 20.14 kg/m.  Constitutional:      Appearance: Healthy appearance. Not in distress.  Neck:     Vascular: JVD normal.  Pulmonary:      Effort: Pulmonary effort is normal.     Breath sounds: No wheezing. No rales. Diminished in the bases Cardiovascular:     Normal rate. Regular rhythm. Normal S1. Normal S2.  Patient had right carotid bruit    Murmurs: There is no murmur.  Edema:    Peripheral edema absent.  Abdominal:     Palpations: Abdomen is soft non tender. There is no hepatomegaly.  Skin:    General: Skin is warm and dry.  Neurological:     General: No focal deficit present.     Mental Status: Alert and oriented to person, place and time.     Cranial Nerves: Cranial nerves are intact.  EKG/LABS/Other Studies Reviewed    ECG personally reviewed by me today -sinus rhythm with right axis deviation and no acute changes    Lab Results  Component Value Date   WBC 7.3 03/22/2021   HGB 13.7 03/22/2021   HCT 40.8 03/22/2021   MCV 92.6 03/22/2021   PLT 186.0 03/22/2021   Lab Results  Component Value Date   CREATININE 0.74 03/22/2021   BUN 13 03/22/2021   NA 140 03/22/2021   K 4.0 03/22/2021   CL 103 03/22/2021   CO2 28 03/22/2021   Lab Results  Component Value Date   ALT 15 03/22/2021   AST 18 03/22/2021   ALKPHOS 49 03/22/2021   BILITOT 0.5 03/22/2021   Lab Results  Component Value Date   CHOL 144 03/22/2021   HDL 62.40 03/22/2021   LDLCALC 55 03/22/2021   TRIG 132.0 03/22/2021   CHOLHDL 2 03/22/2021    Lab Results  Component Value Date   HGBA1C 5.6 03/22/2021    Assessment & Plan    1.  Palpitations: - 2D echo was performed 05/2017 that revealed EF of 55-65%, grade 1 DD, no RWMA, with trivial TV regurgitation.  -Patient had previous event monitor placed in 2018 that showed no arrhythmia or abnormalities -Patient contacted after-hours nurse line with complaint of heart rate elevated to 112 with neck and jaw pain -Today patient reports palpitations at times.  EKG completed that was sinus rhythm however patient had captured atrial fibrillation on her Apple Watch -7-day ZIO monitor to  evaluate for possible atrial arrhythmia  2.  Atypical chest pain: -Patient reports neck and jaw pain with elevated heart rate -Today patient states that chest pain has subsided but still has occasional jaw and neck pain that radiates down to her throat. -Patient has strong family history of coronary artery disease on maternal and paternal sides -Cardiac CTA to evaluate for possible ischemia due to chest discomfort and neck pain -BMET to be completed next Wednesday  3.  Hypertension: -Blood pressures today was 140/84 and patient endorses elevated blood pressures at home in the 150s and 160s -We will start lisinopril 10 mg daily and patient will monitor blood pressures and report findings back to office -BMET in 1 week  4.  Tobacco abuse: -Patient continues to smoke and cessation was discussed -Patient has symptoms of claudication in 2018 with normal US/ABI completed  5.  Carotid bruit: -Right carotid bruit auscultated during examination -Patient will have carotid doppler study to rule out carotid artery disease  Disposition: Follow-up with None or APP in 3 weeks    Medication Adjustments/Labs and Tests Ordered: Current medicines are reviewed at length with the patient today.  Concerns regarding medicines are outlined above.   Signed, Mable Fill, Marissa Nestle, NP 03/29/2022, 3:49 PM [place]

## 2022-03-28 NOTE — Telephone Encounter (Signed)
Pt has appt tomorrow for the symptoms she is experiencing.

## 2022-03-29 ENCOUNTER — Encounter: Payer: Self-pay | Admitting: Nurse Practitioner

## 2022-03-29 ENCOUNTER — Ambulatory Visit (INDEPENDENT_AMBULATORY_CARE_PROVIDER_SITE_OTHER): Payer: Self-pay

## 2022-03-29 ENCOUNTER — Ambulatory Visit: Payer: 59 | Attending: Cardiology | Admitting: Nurse Practitioner

## 2022-03-29 VITALS — BP 140/84 | HR 92 | Ht 65.0 in | Wt 121.0 lb

## 2022-03-29 DIAGNOSIS — R002 Palpitations: Secondary | ICD-10-CM

## 2022-03-29 DIAGNOSIS — R072 Precordial pain: Secondary | ICD-10-CM

## 2022-03-29 DIAGNOSIS — R0989 Other specified symptoms and signs involving the circulatory and respiratory systems: Secondary | ICD-10-CM

## 2022-03-29 DIAGNOSIS — R0789 Other chest pain: Secondary | ICD-10-CM

## 2022-03-29 MED ORDER — METOPROLOL TARTRATE 100 MG PO TABS: 100.0000 mg | ORAL_TABLET | Freq: Once | ORAL | 0 refills | Status: DC

## 2022-03-29 MED ORDER — LISINOPRIL 10 MG PO TABS: 10.0000 mg | ORAL_TABLET | Freq: Every day | ORAL | 3 refills | Status: DC

## 2022-03-29 NOTE — Progress Notes (Unsigned)
Enrolled for [employer] to mail a ZIO XT long term holter monitor to the patients address on file.   Dr. Agustin Cree to read.

## 2022-03-29 NOTE — Patient Instructions (Addendum)
Medication Instructions:  Your physician has recommended you make the following change in your medication:  Start taking Lisinopril 10 mg daily *If you need a refill on your cardiac medications before your next appointment, please call your pharmacy*  Check blood pressures at home and call or message results in a week.   Lab Work: Bmet 1 week  If you have labs (blood work) drawn today and your tests are completely normal, you will receive your results only by: [employer] (if you have MyChart) OR A paper copy in the mail If you have any lab test that is abnormal or we need to change your treatment, we will call you to review the results.   Testing/Procedures Your physician has requested that you have cardiac CT. Cardiac computed tomography (CT) is a painless test that uses an x-ray machine to take clear, detailed pictures of your heart. For further information please visit [email or web address]. Please follow instruction sheet as given.   Your physician has recommended that you wear an event monitor. Event monitors are medical devices that record the heart's electrical activity. Doctors most often [place] these monitors to diagnose arrhythmias. Arrhythmias are problems with the speed or rhythm of the heartbeat. The monitor is a small, portable device. You can wear one while you do your normal daily activities. This is usually used to diagnose what is causing palpitations/syncope (passing out).  Your physician has requested that you have a carotid duplex. This test is an ultrasound of the carotid arteries in your neck. It looks at blood flow through these arteries that supply the brain with blood. Allow one hour for this exam. There are no restrictions or special instructions.   Follow-Up: At [facility], you and your health needs are our priority.  As part of our continuing mission to provide you with exceptional heart care, we have created designated Provider Care Teams.  These Care  Teams include your primary Cardiologist (physician) and Advanced Practice Providers (APPs -  Physician Assistants and Nurse Practitioners) who all work together to provide you with the care you need, when you need it.   Your next appointment:   4 week(s)   The format for your next appointment:   In Person  Provider:   Ambrose Pancoast, NP         Other Instructions .  Your cardiac CT will be scheduled at one of the below locations:   [facility] [address] [place], [place] [address] [phone]  Please arrive at the [facility] (Entrance C2) of [facility] 30 minutes prior to test start time. You can use the FREE valet parking offered at entrance C (encouraged to control the heart rate for the test)  Proceed to the [facility] Radiology Department (first floor) to check-in and test prep.  All radiology patients and guests should use entrance C2 at [facility], accessed from [facility], even though the hospital's physical address listed is [address].    Please follow these instructions carefully (unless otherwise directed):  On the Night Before the Test: Be sure to Drink plenty of water. Do not consume any caffeinated/decaffeinated beverages or chocolate 12 hours prior to your test. Do not take any antihistamines 12 hours prior to your test.  On the Day of the Test: Drink plenty of water until 1 hour prior to the test. You may take your regular medications prior to the test.  Take metoprolol (Lopressor) two hours prior  to test. HOLD Furosemide/Hydrochlorothiazide morning of the test. FEMALES- please wear underwire-free bra if available, avoid dresses & tight clothing      After the Test: Drink plenty of water. After receiving IV contrast, you may experience a mild flushed feeling. This is normal. On occasion, you may experience a mild rash up to 24 hours after the test. This is not dangerous. If  this occurs, you can take Benadryl 25 mg and increase your fluid intake. If you experience trouble breathing, this can be serious. If it is severe call 911 IMMEDIATELY. If it is mild, please call our office. If you take any of these medications: Glipizide/Metformin, Avandament, Glucavance, please do not take 48 hours after completing test unless otherwise instructed.  We will call to schedule your test 2-4 weeks out understanding that some insurance companies will need an authorization prior to the service being performed.   For non-scheduling related questions, please contact the cardiac imaging nurse navigator should you have any questions/concerns: Marchia Bond, Cardiac Imaging Nurse Navigator Gordy Clement, Cardiac Imaging Nurse Navigator [facility] Direct Office Dial: [phone]   For scheduling needs, including cancellations and rescheduling, please call [place], [phone].    ZIO AT Long term monitor-Live Telemetry  Your physician has requested you wear a ZIO patch monitor for 7 days.  This is a single patch monitor. [employer] supplies one patch monitor per enrollment. Additional  stickers are not available.  Please do not apply patch if you will be having a Nuclear Stress Test, Echocardiogram, Cardiac CT, MRI,  or Chest Xray during the period you would be wearing the monitor. The patch cannot be worn during  these tests. You cannot remove and re-apply the ZIO AT patch monitor.  Your ZIO patch monitor will be mailed 3 day USPS to your address on file. It may take 3-5 days to  receive your monitor after you have been enrolled.  Once you have received your monitor, please review the enclosed instructions. Your monitor has  already been registered assigning a specific monitor serial # to you.   Billing and Patient Assistance Program information  [name] has been supplied with any insurance information on record for billing. [employer] offers a sliding  scale Patient Assistance Program for patients without insurance, or whose  insurance does not completely cover the cost of the ZIO patch monitor. You must apply for the  Patient Assistance Program to qualify for the discounted rate. To apply, call [employer] at [phone],  select option 4, select option 2 , ask to apply for the Patient Assistance Program, (you can request an  interpreter if needed). [employer] will ask your household income and how many people are in your  household. [employer] will quote your out-of-pocket cost based on this information. They will also be able  to set up a 12 month interest free payment plan if needed.  Applying the monitor   Shave hair from upper left chest.  Hold the abrader disc by orange tab. Rub the abrader in 40 strokes over left upper chest as indicated in  your monitor instructions.  Clean area with 4 enclosed alcohol pads. Use all pads to ensure the area is cleaned thoroughly. Let  dry.  Apply patch as indicated in monitor instructions. Patch will be placed under collarbone on left side of  chest with arrow pointing upward.  Rub patch adhesive wings for 2 minutes. Remove the white label marked "1". Remove the white label  marked "2". Rub patch adhesive  wings for 2 additional minutes.  While looking in a mirror, press and release button in center of patch. A small green light will flash 3-4  times. This will be your only indicator that the monitor has been turned on.  Do not shower for the first 24 hours. You may shower after the first 24 hours.  Press the button if you feel a symptom. You will hear a small click. Record Date, Time and Symptom in  the Patient Log.   Starting the Gateway  In your kit there is a Hydrographic surveyor box the size of a cellphone. This is Airline pilot. It transmits all your  recorded data to [facility] This box must always stay within 10 feet of you. Open the box and push the *  button. There will be a light that blinks orange and  then green a few times. When the light stops  blinking, the Gateway is connected to the ZIO patch. Call [employer] at [phone] to confirm your monitor is transmitting.  Returning your monitor  Remove your patch and place it inside the [place]. In the lower half of the Gateway there is a white  bag with prepaid postage on it. Place Gateway in bag and seal. Mail package back to [place] as soon as  possible. Your physician should have your final report approximately 7 days after you have mailed back  your monitor. Call [place] at [phone] if you have questions regarding your ZIO AT  patch monitor. Call them immediately if you see an orange light blinking on your monitor.  If your monitor falls off in less than 4 days, contact our Monitor department at [phone]. If your  monitor becomes loose or falls off after 4 days call [employer] at [phone] for suggestions on  securing your monitor

## 2022-03-30 NOTE — Addendum Note (Signed)
Addended by: Janan Halter F on: 03/30/2022 12:53 PM   Modules accepted: Orders

## 2022-04-02 DIAGNOSIS — R002 Palpitations: Secondary | ICD-10-CM

## 2022-04-02 DIAGNOSIS — R072 Precordial pain: Secondary | ICD-10-CM

## 2022-04-06 ENCOUNTER — Telehealth: Payer: Self-pay | Admitting: Cardiology

## 2022-04-06 ENCOUNTER — Other Ambulatory Visit: Payer: Self-pay

## 2022-04-06 MED ORDER — METOPROLOL SUCCINATE ER 25 MG PO TB24: 12.5000 mg | ORAL_TABLET | Freq: Every day | ORAL | 3 refills | Status: DC

## 2022-04-06 NOTE — Telephone Encounter (Signed)
Returned call to patient. Patient reports going to ED for evaluation today. She states her potassium was low and she was in afib. Per Ambrose Pancoast, NP she is to start taking Toprol xL 12.5 mg  daily, Continue to wear the event monitor and keep appointment as scheduled for the Coronary CT scan and follow up with Jaquelyn Bitter in October.  Advised patient to call if her condition worsens before her next appointment. Education provided on potassium rich foods and hydration. Patient verbalized understanding.

## 2022-04-06 NOTE — Telephone Encounter (Signed)
Patient called stating she had a ED visit last night and they recommended she follow-up with her cardiologist.  Patient stated a test was done in the ED that showed she was in A-fib.  Patient stated she gets headaches, her face gets flushed and she had low potassium.  Patient stated she has not started the lisinopril (ZESTRIL) 10 MG tablet as yet.

## 2022-04-09 ENCOUNTER — Ambulatory Visit: Payer: Self-pay | Attending: Cardiology

## 2022-04-09 ENCOUNTER — Other Ambulatory Visit: Payer: Self-pay | Admitting: *Deleted

## 2022-04-09 DIAGNOSIS — Z79899 Other long term (current) drug therapy: Secondary | ICD-10-CM

## 2022-04-10 LAB — BASIC METABOLIC PANEL
BUN/Creatinine Ratio: 12 (ref 9–23)
BUN: 9 mg/dL (ref 6–24)
CO2: 22 mmol/L (ref 20–29)
Calcium: 9.7 mg/dL (ref 8.7–10.2)
Chloride: 101 mmol/L (ref 96–106)
Creatinine, Ser: 0.75 mg/dL (ref 0.57–1.00)
Glucose: 99 mg/dL (ref 70–99)
Potassium: 4.2 mmol/L (ref 3.5–5.2)
Sodium: 139 mmol/L (ref 134–144)
eGFR: 99 mL/min/{1.73_m2} (ref >59)

## 2022-04-13 ENCOUNTER — Ambulatory Visit (HOSPITAL_COMMUNITY)
Admission: RE | Admit: 2022-04-13 | Discharge: 2022-04-13 | Disposition: A | Discharge status: Home / Self Care | Payer: Self-pay | Source: Ambulatory Visit | Attending: Cardiovascular Disease | Admitting: Cardiovascular Disease

## 2022-04-13 DIAGNOSIS — R0989 Other specified symptoms and signs involving the circulatory and respiratory systems: Secondary | ICD-10-CM

## 2022-04-18 ENCOUNTER — Telehealth (HOSPITAL_COMMUNITY): Payer: Self-pay | Admitting: Emergency Medicine

## 2022-04-18 NOTE — Telephone Encounter (Signed)
Reaching out to patient to offer assistance regarding upcoming cardiac imaging study; pt verbalizes understanding of appt date/time, parking situation and where to check in, pre-test NPO status and medications ordered, and verified current allergies; name and call back number provided for further questions should they arise Marchia Bond RN Navigator Cardiac Imaging [facility] [phone] office [phone] cell  Arrival 1200 w/c entrance Denies iv issues Aware nitro 100mg  metoprolol tartrate

## 2022-04-19 ENCOUNTER — Ambulatory Visit (HOSPITAL_COMMUNITY)
Admission: RE | Admit: 2022-04-19 | Discharge: 2022-04-19 | Disposition: A | Discharge status: Home / Self Care | Payer: Self-pay | Source: Ambulatory Visit | Attending: Cardiology | Admitting: Cardiology

## 2022-04-19 DIAGNOSIS — R002 Palpitations: Secondary | ICD-10-CM | POA: Insufficient documentation

## 2022-04-19 DIAGNOSIS — R072 Precordial pain: Secondary | ICD-10-CM | POA: Insufficient documentation

## 2022-04-19 MED ORDER — NITROGLYCERIN 0.4 MG SL SUBL
SUBLINGUAL_TABLET | SUBLINGUAL | Status: AC
  Filled 2022-04-19: qty 2

## 2022-04-19 MED ORDER — NITROGLYCERIN 0.4 MG SL SUBL
0.8000 mg | SUBLINGUAL_TABLET | Freq: Once | SUBLINGUAL | Status: AC
  Administered 2022-04-19: 0.8 mg via SUBLINGUAL

## 2022-04-19 MED ORDER — IOHEXOL 350 MG/ML SOLN
95.0000 mL | Freq: Once | INTRAVENOUS | Status: AC | PRN
  Administered 2022-04-19: 95 mL via INTRAVENOUS

## 2022-04-19 MED ORDER — METOPROLOL TARTRATE 5 MG/5ML IV SOLN
INTRAVENOUS | Status: AC
  Administered 2022-04-19: 5 mg
  Filled 2022-04-19: qty 5

## 2022-04-24 ENCOUNTER — Telehealth: Payer: Self-pay

## 2022-04-24 NOTE — Telephone Encounter (Signed)
-----   Message from Park Liter, MD sent at 04/20/2022  8:19 AM EDT ----- Coronary CT angio negative

## 2022-04-24 NOTE — Telephone Encounter (Signed)
Patient notified of results via mychart

## 2022-04-25 NOTE — Progress Notes (Signed)
3.   Office Visit    Patient Name: [name] Date of Encounter: 04/27/2022  Primary Care Provider:  Darreld Mclean, MD Primary Cardiologist:  None Primary Electrophysiologist: None  Chief Complaint    [name] is a 46 y.o. female with PMH of HTN, lung abscess s/p post lobectomy 2010, tobacco use, premature family history of CAD, atypical chest pain, melanoma, migraines, heart murmur who presents today for 4-week follow-up of complaint of jaw pain and chest pain.    Past Medical History    Past Medical History:  Diagnosis Date   Anemia    Atypical chest pain 09/22/2008   24 Holter Monitor showing NSR while patient endorsing symptoms.     Back pain 02/13/2012   Claudication in peripheral vascular disease ([place]) 05/24/2017   DYSPLASTIC NEVUS, BACK 06/24/2008   Qualifier: Diagnosis of  By: Tommy Medal MD, [place]     Elevated blood pressure reading without diagnosis of hypertension 01/25/2012   Episodic tension type headache 07/21/2013   Family history of coronary artery disease 12/05/2010   FATIGUE 05/18/2010   Qualifier: Diagnosis of  By: Walker Kehr MD, [place]     Headache(784.0) 01/25/2012   Heart murmur    no problems   Hemorrhoids    History of melanoma 04/23/2017   Hot thyroid nodule 2009   needle bx negative   Irregular menses 08/24/2010   Lung abscess ([place]) 01/15/2011   MALIGNANT MELANOMA SKIN TRUNK EXCEPT SCROTUM 09/22/2008   Qualifier: Diagnosis of  By: Tommy Medal MD, [place]     Melanoma [facility])    stage 3 - mid back   Menorrhagia    Migraine headache    Palpitations 05/18/2010   Qualifier: Diagnosis of  By: Walker Kehr MD, [place]     Paroxysmal hypertension 07/21/2013   Pituitary cyst ([place])    Pneumonia, organism unspecified(486) 11/09/2008   Formatting of this note might be different from the original. 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   No evidence of active TB     Tobacco abuse counseling 06/23/2008   Vaso vagal episode    WEIGHT LOSS, ABNORMAL 06/24/2008   Qualifier: Diagnosis of  By: Tommy Medal MD, [place] Laming: Diagnosis of  By: Walker Kehr MD, [place]     Past Surgical History:  Procedure Laterality Date   BREAST SURGERY     augmentation   BRONCHOSCOPY     COLONOSCOPY  2010   in [place], [place]-normal exam   DILATION AND CURETTAGE OF UTERUS N/A 10/22/2017   Procedure: DILATATION AND CURETTAGE;  Surgeon: Emily Filbert, MD;  Location: [facility];  Service: Gynecology;  Laterality: N/A;   LUNG REMOVAL, PARTIAL Left 2010   upper left lobe   MEDIASTINOSCOPY     TUBAL LIGATION     UPPER GI ENDOSCOPY  2010   in [place], [place]-normal   wide excision     melanoma removed - mid back   WISDOM TOOTH EXTRACTION      Allergies  No Known Allergies  History of Present Illness    [name] has a PMH of is a 46 year old female with the above mention past medical history who presents today for jaw pain and  irregular heartbeat.  [name] was initially seen by Dr. Aundra Dubin in 2012 for complaint of palpitations and heart racing.  She was seen by Dr. Agustin Cree in 04/2017 for atypical chest pain that she described as radiating towards the neck and both arms.  She has a strong family history of premature coronary artery disease.  She also endorses pain when walking that occurs in her right leg event monitor was placed that revealed no abnormalities or arrhythmias.  2D echo was performed 05/2017 that revealed EF of 55-65%, grade 1 DD, no RWMA, with trivial TV regurgitation.  Patient also had a negative stress echo test performed.  ABI was also performed that was normal in both legs.  She contacted the nurse line with complaints of palpitations.  She also endorsed jaw pain as well.  Patient was seen for follow-up on 03/29/2022 for complaint of chest discomfort and jaw  pain.  She also endorsed palpitations and few beats of atrial fibrillation per her Apple Watch.  During her visit patient stated she was under tremendous amount of stress due to recent death of her sister.  Patient had full ACS work-up at [facility] that was negative.  Of visit her blood pressure was elevated at 140/84 and patient reported pressures in the 150s and 160s at home.  She was started on lisinopril 10 mg and 7-day ZIO monitor was ordered to quantify possible atrial fibrillation.  She also had a cardiac CT to rule out possible ischemia related to chest discomfort and neck pain.  CT results showed calcium score of 0 and normal study.  [name] presents today alone for 4-week follow-up.  Since last being seen in the office patient reports that she has been feeling much better with no chest pain and decreased palpitations.  She is currently not taking lisinopril and blood pressures today initially was 134/80 and was 124/78 on recheck.  She was instructed to continue to monitor blood pressures and report any systolic greater than 226.  During our visit we reviewed results for her event monitor, cardiac CT, and carotid Doppler.  Patient had all questions answered to her satisfaction.  We discussed the importance of primary prevention with ASA 81 mg and statin therapy.  Patient denies chest pain, palpitations, dyspnea, PND, orthopnea, nausea, vomiting, dizziness, syncope, edema, weight gain, or early satiety.  Home Medications    Current Outpatient Medications  Medication Sig Dispense Refill   aspirin EC 81 MG tablet Take 81 mg by mouth daily. Swallow whole.     Aspirin-Salicylamide-Caffeine (BC HEADACHE PO) Take by mouth.     atorvastatin (LIPITOR) 10 MG tablet Take 1 tablet by mouth once daily 30 tablet 1   clobetasol cream (TEMOVATE) 3.33 % Apply 1 application topically daily.     ibuprofen (ADVIL,MOTRIN) 600 MG tablet Take 1 tablet (600 mg total) by mouth every 6 (six) hours as  needed. 30 tablet 0   metoprolol succinate (TOPROL XL) 25 MG 24 hr tablet Take 0.5 tablets (12.5 mg total) by mouth at bedtime. Take extra tablet if having palpitations. 90 tablet 3   No current facility-administered medications for this visit.     Review of Systems  Please see the history of present illness.    All other systems reviewed and are otherwise negative except as noted above.  Physical Exam    Wt Readings from Last 3 Encounters:  04/27/22 122 lb (55.3 kg)  03/29/22 121 lb (54.9 kg)  05/31/21 127 lb (57.6 kg)  VS: Vitals:   04/27/22 0814 04/27/22 0842  BP: 134/80 124/78  Pulse: 80   SpO2: 97%   ,Body mass index is 20.3 kg/m.  Constitutional:      Appearance: Healthy appearance. Not in distress.  Neck:     Vascular: JVD normal.  Pulmonary:     Effort: Pulmonary effort is normal.     Breath sounds: No wheezing. No rales. Diminished in the bases Cardiovascular:     Normal rate. Regular rhythm. Normal S1. Normal S2.      Murmurs: There is no murmur.  Edema:    Peripheral edema absent.  Abdominal:     Palpations: Abdomen is soft non tender. There is no hepatomegaly.  Skin:    General: Skin is warm and dry.  Neurological:     General: No focal deficit present.     Mental Status: Alert and oriented to person, place and time.     Cranial Nerves: Cranial nerves are intact.  EKG/LABS/Other Studies Reviewed    ECG personally reviewed by me today -none completed today   Lab Results  Component Value Date   WBC 7.3 03/22/2021   HGB 13.7 03/22/2021   HCT 40.8 03/22/2021   MCV 92.6 03/22/2021   PLT 186.0 03/22/2021   Lab Results  Component Value Date   CREATININE 0.75 04/09/2022   BUN 9 04/09/2022   NA 139 04/09/2022   K 4.2 04/09/2022   CL 101 04/09/2022   CO2 22 04/09/2022   Lab Results  Component Value Date   ALT 15 03/22/2021   AST 18 03/22/2021   ALKPHOS 49 03/22/2021   BILITOT 0.5 03/22/2021   Lab Results  Component Value Date   CHOL 144  03/22/2021   HDL 62.40 03/22/2021   LDLCALC 55 03/22/2021   TRIG 132.0 03/22/2021   CHOLHDL 2 03/22/2021    Lab Results  Component Value Date   HGBA1C 5.6 03/22/2021    Assessment & Plan    1.nonobstructive CAD /atypical chest pain: -Today patient reports no neck or chest pain since previous visit -Cardiac CTA completed that showed no calcium and normal coronaries. -Patient advised to contact our office if chest pain persist and go to the ED if not resolved or becomes worse in intensity -We will check lipids and LFTs today -Continue GDMT with ASA 81 mg and plan to resume atorvastatin 10 mg following lipid results  2.  Palpitations: - 2D echo was performed 05/2017 that revealed EF of 55-65%, grade 1 DD, no RWMA, with trivial TV regurgitation.  -Results of ZIO monitor were similar to 2018 results showing no arrhythmias -Patient instructed to take extra 12.5 mg of Toprol-XL for breakthrough palpitations  3.  Elevated blood pressure: -Patient reported elevated blood pressures in the 150s and 160s at home and pressure during previous visit was 140/84. -Today patient's pressure was 124/78 well-controlled -Patient advised to continue to check blood pressures at home and contact office for systolics remaining above 140 -Continue Toprol-XL as noted above  4.  Tobacco abuse: -Smoking cessation advised and patient encouraged to contact our office for any support  5.  Arterial fibromuscular dysplasia: -Patient had carotid Dopplers completed with no evidence of stenosis -Patient had evidence of dysplasia with bleeding and bilateral carotids. -I will forward to primary cardiologist for recommendations for treatment plan     Disposition: Follow-up with Dr. Agustin Cree as scheduled    Medication Adjustments/Labs and Tests Ordered: Current medicines are reviewed at length with the patient today.  Concerns  regarding medicines are outlined above.   Signed, Mable Fill, Marissa Nestle,  NP 04/27/2022, 9:18 AM [facility]  Note:  This document was prepared using Dragon voice recognition software and may include unintentional dictation errors.

## 2022-04-27 ENCOUNTER — Encounter: Payer: Self-pay | Admitting: Nurse Practitioner

## 2022-04-27 ENCOUNTER — Ambulatory Visit: Payer: Self-pay | Attending: Nurse Practitioner | Admitting: Nurse Practitioner

## 2022-04-27 VITALS — BP 124/78 | HR 80 | Ht 65.0 in | Wt 122.0 lb

## 2022-04-27 DIAGNOSIS — I2510 Atherosclerotic heart disease of native coronary artery without angina pectoris: Secondary | ICD-10-CM

## 2022-04-27 DIAGNOSIS — I773 Arterial fibromuscular dysplasia: Secondary | ICD-10-CM

## 2022-04-27 DIAGNOSIS — R030 Elevated blood-pressure reading, without diagnosis of hypertension: Secondary | ICD-10-CM

## 2022-04-27 DIAGNOSIS — Z79899 Other long term (current) drug therapy: Secondary | ICD-10-CM

## 2022-04-27 DIAGNOSIS — I10 Essential (primary) hypertension: Secondary | ICD-10-CM

## 2022-04-27 LAB — LIPID PANEL
Chol/HDL Ratio: 2.6 ratio (ref 0.0–4.4)
Cholesterol, Total: 172 mg/dL (ref 100–199)
HDL: 65 mg/dL (ref >39)
LDL Chol Calc (NIH): 96 mg/dL (ref 0–99)
Triglycerides: 57 mg/dL (ref 0–149)
VLDL Cholesterol Cal: 11 mg/dL (ref 5–40)

## 2022-04-27 LAB — HEPATIC FUNCTION PANEL
ALT: 15 IU/L (ref 0–32)
AST: 18 IU/L (ref 0–40)
Albumin: 4.7 g/dL (ref 3.9–4.9)
Alkaline Phosphatase: 53 IU/L (ref 44–121)
Bilirubin Total: 0.3 mg/dL (ref 0.0–1.2)
Bilirubin, Direct: 0.1 mg/dL (ref 0.00–0.40)
Total Protein: 7 g/dL (ref 6.0–8.5)

## 2022-04-27 MED ORDER — METOPROLOL SUCCINATE ER 25 MG PO TB24: 12.5000 mg | ORAL_TABLET | Freq: Every day | ORAL | 3 refills | Status: DC

## 2022-04-27 NOTE — Patient Instructions (Addendum)
Medication Instructions:      IF YOUR EXPERIENCE  ANY PALPITATIONS YOU MAY TAKE AN EXTRA 12.5 MG TOPROL XL ONCE A     DAY    *If you need a refill on your cardiac medications before your next appointment, please call your pharmacy*   Lab Work: LFT AND LIPIDS TODAY   If you have labs (blood work) drawn today and your tests are completely normal, you will receive your results only by: [place] (if you have MyChart) OR A paper copy in the mail If you have any lab test that is abnormal or we need to change your treatment, we will call you to review the results.   Testing/Procedures: NONE ORDERED  TODAY   Follow-Up: At [facility], you and your health needs are our priority.  As part of our continuing mission to provide you with exceptional heart care, we have created designated Provider Care Teams.  These Care Teams include your primary Cardiologist (physician) and Advanced Practice Providers (APPs -  Physician Assistants and Nurse Practitioners) who all work together to provide you with the care you need, when you need it.  We recommend signing up for the patient portal called "MyChart".  Sign up information is provided on this After Visit Summary.  MyChart is used to connect with patients for Virtual Visits (Telemedicine).  Patients are able to view lab/test results, encounter notes, upcoming appointments, etc.  Non-urgent messages can be sent to your provider as well.   To learn more about what you can do with MyChart, go to [email or web address].    Your next appointment:   4 -6 month(s)  The format for your next appointment:   In Person  Provider:   Jenne Campus, MD    Other Instructions  KEEP BLOOD PRESSURE LOG FOR 2 WEEKS AND SEND IN BLOOD PRESSURE READINGS VIA [place]  Heart-Healthy Eating Plan Eating a healthy diet is important for the health of your heart. A heart-healthy eating plan includes: Eating less unhealthy fats. Eating more healthy  fats. Eating less salt in your food. Salt is also called sodium. Making other changes in your diet. Talk with your doctor or a diet specialist (dietitian) to create an eating plan that is right for you. What is my plan? Your doctor may recommend an eating plan that includes: Total fat: ______% or less of total calories a day. Saturated fat: ______% or less of total calories a day. Cholesterol: less than _________mg a day. Sodium: less than _________mg a day. What are tips for following this plan? Cooking Avoid frying your food. Try to bake, boil, grill, or broil it instead. You can also reduce fat by: Removing the skin from poultry. Removing all visible fats from meats. Steaming vegetables in water or broth. Meal planning  At meals, divide your plate into four equal parts: Fill one-half of your plate with vegetables and green salads. Fill one-fourth of your plate with whole grains. Fill one-fourth of your plate with lean protein foods. Eat 2-4 cups of vegetables per day. One cup of vegetables is: 1 cup (91 g) broccoli or cauliflower florets. 2 medium carrots. 1 large bell pepper. 1 large [place] potato. 1 large tomato. 1 medium white potato. 2 cups (150 g) raw leafy greens. Eat 1-2 cups of fruit per day. One cup of fruit is: 1 small apple 1 large banana 1 cup (237 g) mixed fruit, 1 large orange,  cup (82 g) dried fruit, 1 cup (240 mL) 100% fruit juice. Eat  more foods that have soluble fiber. These are apples, broccoli, carrots, beans, peas, and barley. Try to get 20-30 g of fiber per day. Eat 4-5 servings of nuts, legumes, and seeds per week: 1 serving of dried beans or legumes equals  cup (90 g) cooked. 1 serving of nuts is  oz (12 almonds, 24 pistachios, or 7 walnut halves). 1 serving of seeds equals  oz (8 g). General information Eat more home-cooked food. Eat less restaurant, buffet, and fast food. Limit or avoid alcohol. Limit foods that are high in starch and  sugar. Avoid fried foods. Lose weight if you are overweight. Keep track of how much salt (sodium) you eat. This is important if you have high blood pressure. Ask your doctor to tell you more about this. Try to add vegetarian meals each week. Fats Choose healthy fats. These include olive oil and canola oil, flaxseeds, walnuts, almonds, and seeds. Eat more omega-3 fats. These include salmon, mackerel, sardines, tuna, flaxseed oil, and ground flaxseeds. Try to eat fish at least 2 times each week. Check food labels. Avoid foods with trans fats or high amounts of saturated fat. Limit saturated fats. These are often found in animal products, such as meats, butter, and cream. These are also found in plant foods, such as palm oil, palm kernel oil, and coconut oil. Avoid foods with partially hydrogenated oils in them. These have trans fats. Examples are stick margarine, some tub margarines, cookies, crackers, and other baked goods. What foods should I eat? Fruits All fresh, canned (in natural juice), or frozen fruits. Vegetables Fresh or frozen vegetables (raw, steamed, roasted, or grilled). Green salads. Grains Most grains. Choose whole wheat and whole grains most of the time. Rice and pasta, including brown rice and pastas made with whole wheat. Meats and other proteins Lean, well-trimmed beef, veal, pork, and lamb. Chicken and [place] without skin. All fish and shellfish. Wild duck, rabbit, pheasant, and venison. Egg whites or low-cholesterol egg substitutes. Dried beans, peas, lentils, and tofu. Seeds and most nuts. Dairy Low-fat or nonfat cheeses, including ricotta and mozzarella. Skim or 1% milk that is liquid, powdered, or evaporated. Buttermilk that is made with low-fat milk. Nonfat or low-fat yogurt. Fats and oils Non-hydrogenated (trans-free) margarines. Vegetable oils, including soybean, sesame, sunflower, olive, peanut, safflower, corn, canola, and cottonseed. Salad dressings or mayonnaise  made with a vegetable oil. Beverages Mineral water. Coffee and tea. Diet carbonated beverages. Sweets and desserts Sherbet, gelatin, and fruit ice. Small amounts of dark chocolate. Limit all sweets and desserts. Seasonings and condiments All seasonings and condiments. The items listed above may not be a complete list of foods and drinks you can eat. Contact a dietitian for more options. What foods should I avoid? Fruits Canned fruit in heavy syrup. Fruit in cream or butter sauce. Fried fruit. Limit coconut. Vegetables Vegetables cooked in cheese, cream, or butter sauce. Fried vegetables. Grains Breads that are made with saturated or trans fats, oils, or whole milk. Croissants. Gehring rolls. Donuts. High-fat crackers, such as cheese crackers. Meats and other proteins Fatty meats, such as hot dogs, ribs, sausage, bacon, rib-eye roast or steak. High-fat deli meats, such as salami and bologna. Caviar. Domestic duck and goose. Organ meats, such as liver. Dairy Cream, sour cream, cream cheese, and creamed cottage cheese. Whole-milk cheeses. Whole or 2% milk that is liquid, evaporated, or condensed. Whole buttermilk. Cream sauce or high-fat cheese sauce. Yogurt that is made from whole milk. Fats and oils Meat fat, or shortening. Cocoa  butter, hydrogenated oils, palm oil, coconut oil, palm kernel oil. Solid fats and shortenings, including bacon fat, salt pork, lard, and butter. Nondairy cream substitutes. Salad dressings with cheese or sour cream. Beverages Regular sodas and juice drinks with added sugar. Sweets and desserts Frosting. Pudding. Cookies. Cakes. Pies. Milk chocolate or white chocolate. Buttered syrups. Full-fat ice cream or ice cream drinks. The items listed above may not be a complete list of foods and drinks to avoid. Contact a dietitian for more information. Summary Heart-healthy meal planning includes eating less unhealthy fats, eating more healthy fats, and making other changes  in your diet. Eat a balanced diet. This includes fruits and vegetables, low-fat or nonfat dairy, lean protein, nuts and legumes, whole grains, and heart-healthy oils and fats. This information is not intended to replace advice given to you by your health care provider. Make sure you discuss any questions you have with your health care provider. Document Revised: 08/07/2021 Document Reviewed: 08/07/2021 Elsevier Patient Education  [place]

## 2022-05-01 ENCOUNTER — Telehealth: Payer: Self-pay | Admitting: *Deleted

## 2022-05-01 ENCOUNTER — Telehealth: Payer: Self-pay | Admitting: Cardiology

## 2022-05-01 DIAGNOSIS — Z79899 Other long term (current) drug therapy: Secondary | ICD-10-CM

## 2022-05-01 DIAGNOSIS — I2510 Atherosclerotic heart disease of native coronary artery without angina pectoris: Secondary | ICD-10-CM

## 2022-05-01 MED ORDER — ATORVASTATIN CALCIUM 10 MG PO TABS: 10.0000 mg | ORAL_TABLET | Freq: Every day | ORAL | 3 refills | Status: DC

## 2022-05-01 NOTE — Telephone Encounter (Signed)
See result encounter.

## 2022-05-01 NOTE — Telephone Encounter (Signed)
-----   Message from Marylu Lund., NP sent at 04/28/2022 11:40 AM EDT ----- Please let Ms. Staub know that her cholesterol has improved since last year especially her triglycerides and HDL.  Your liver function is also normal.  LDL level is still above goal of less than 70.  Primary prevention is recommended with ASA and statin.  Please have her resume Lipitor 10 mg p.o. daily and we will recheck LFTs and lipids in 6 months.  Please let me know if you have any additional questions.  Ambrose Pancoast, NP

## 2022-05-01 NOTE — Telephone Encounter (Signed)
The patient has been notified of the result and verbalized understanding.  All questions (if any) were answered. Darrell Jewel, RN 05/01/2022 10:40 AM   Medication and labs ordered. Scheduled labs.

## 2022-05-01 NOTE — Telephone Encounter (Signed)
Pt is calling back for results. Requesting call back.

## 2022-05-04 ENCOUNTER — Telehealth: Payer: Self-pay

## 2022-05-04 NOTE — Telephone Encounter (Signed)
-----   Message from Park Liter, MD sent at 04/24/2022  9:56 AM EDT ----- Coronary CT angio did not show any coronary artery disease.  Noncardiac CT normal

## 2022-05-04 NOTE — Telephone Encounter (Signed)
Patient notified via mychart

## 2022-05-04 NOTE — Telephone Encounter (Signed)
Patient notified of results.

## 2022-05-04 NOTE — Telephone Encounter (Signed)
-----   Message from Park Liter, MD sent at 04/16/2022  4:53 PM EDT ----- Carotic arteries show possibility of fibromuscular dysplasia however no obstruction, she also got thyroid nodule that be followed by internal medicine team.  In terms of carotids we do need to do anything about it

## 2022-05-15 ENCOUNTER — Telehealth: Payer: Self-pay

## 2022-05-15 NOTE — Telephone Encounter (Signed)
-----   Message from Park Liter, MD sent at 05/10/2022  5:57 PM EDT ----- Monitor was normal, did not show any arrhythmia

## 2022-05-15 NOTE — Telephone Encounter (Signed)
Patient notified of results via mychart

## 2022-06-04 ENCOUNTER — Ambulatory Visit: Payer: Self-pay | Admitting: Internal Medicine

## 2022-06-04 NOTE — Progress Notes (Deleted)
Patient ID: [name], female   DOB: Nov 07, 1975, 46 y.o.   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 She is the mother of [name], also my pt. last visit 1 year ago.  Interim history:  Reviewed history: Pt. established care with 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  She did not have insurance at that time so was not able to follow-up with another for this.    She was referred to see Dr. Jeanann Lewandowsky afterwards and had labs drawn at that time, however, the results were lost.  Per review of records brought by patient, it was mentioned that at that time she had a low ACTH and a normal cortisol, but I do not have the actual values. She recently requested a referral to endocrinology for further investigation.  [name] checked thyroid tests, FSH, prolactin, and estrogen and these were all normal.  Pt mentions: - headaches -"all the time" - on Goody's, BC powder, Ibuprofen, Tylenol - regular menses. She had an ablation /D and C for adenomyosis; h/o heavy menses - was on OCPs before  No: - weight loss - weight gain - increase in ring or shoe size - galactorrhea - fatigue - constipation - hair loss  She has dry skin on hands, rash on neck and chest, and rash on the back of her neck.  I reviewed previous investigation: MRI brain/neck (05/19/2008): (pertinent summary): Clinical Data:  46 year old female with tuberculosis exposure.  Weight loss, weakness and neck pain.   Evaluate for venous  tuberculosis.      IMPRESSION:     1.  Normal MRI appearance of the brain.  2.  Minor mastoid air cell inflammatory changes probably associated  with adenoid and tonsillar soft tissue hypertrophy which may be  sequelae of recent  upper respiratory tract infection.     MRI CERVICAL SPINE WITHOUT AND WITH CONTRAST:  There are two sub centimeter T2 hyperintense nodules  in the right thyroid lobe measuring 5-6 mm.  Visualized thyroid  parenchyma and other paraspinal soft tissues are within normal  limits.  Multiple non-enlarged cervical lymph nodes, within normal  limits for age.       IMPRESSION:     1.  Normal cervical spinal cord.  Normal cervical spine for age  aside straightening of lordosis which may be positional or related  to muscle spasm.  2.  Two sub centimeter right thyroid lobe nodules are too small for  histologic sampling.  Consider a 12 month follow-up thyroid  ultrasound.    MRI brain (09/14/2010): Clinical Data: Headache and dizziness.  Positive pregnancy test but  not pregnant.  Evaluate pituitary.  Findings: Dynamic pituitary protocol was performed with thin  sections through the sella.  The patient has braces and with  artifact overlying the frontal lobes.  This causes slight  degradation of images involving the   pituitary which remains  adequately evaluated.   Ventricle size is normal.  Negative for acute or chronic infarct.  Brainstem is normal.  No intracranial hemorrhage or mass.   The pituitary is normal in size measuring 6 mm in height.  2 mm  well circumscribed  nonenhancing  hypodensity in the superior  pituitary gland just  behind the infundibulum.  This appears to  represent a small pituitary cyst.  The remainder of the pituitary  gland enhances homogeneously.  The infundibulum is normal in the  midline.  There is no compression of the optic chiasm.  Cavernous  sinus is normal bilaterally.   IMPRESSION:   No significant abnormality the brain.  2 mm nonenhancing pituitary cyst.     PET scan (12/19/2010): PET images demonstrate no abnormal activity within head or neck.   2022: Ophthalmology exam (Dr. Alois Cliche): borderline eye pressure (22), but no visual field cuts  reportedly  Pituitary MRI (11/08/2021): Stable, small, pituitary cyst  Thyroid nodules.    Reviewed previous investigation: Thyroid U/S (08/15/2009):  At that time, she reportedly had a thyroid nodule biopsy of the dominant nodule and this was benign.  Thyroid U/S (11/07/2021): Parenchymal Echotexture: Mildly heterogenous  Isthmus: 2 mm  Right lobe: 6.0 x 1.6 x 1.9 cm  Left lobe: 5.2 x 1.6 x 1.5 cm  _______________________________________   Estimated total number of nodules >/= 1 cm: 2  _______________________________________   Nodule # 2:  Location: Right; Mid  Maximum size: 1.6 cm; Other 2 dimensions: 1.3 x 0.9 cm  Composition: solid/almost completely solid (2)  Echogenicity: hypoechoic (2) Echogenic foci: punctate echogenic foci (3) **Given size (>/= 1.0 cm) and appearance, fine needle aspiration of this highly suspicious nodule should be considered based on TI-RADS criteria. _________________________________________________________   Nodule # 5:  Location: Left; Inferior  Maximum size: 1.8 cm; Other 2 dimensions: 1.1 x 1.1 cm  Composition: solid/almost completely solid (2)  Echogenicity: hypoechoic (2) **Given size (>/= 1.5 cm) and appearance, fine needle aspiration of this moderately suspicious nodule should be considered based on TI-RADS criteria.  _________________________________________________________   There are a few scattered additional subcentimeter cystic and isoechoic nodules noted, all measuring 9 mm or less. These would not meet criteria for any biopsy or follow-up and are not fully described by TI rads criteria.   No hypervascularity.  No regional adenopathy.   IMPRESSION: 1.6 cm right mid thyroid TR 5 nodule and 1.8 cm left inferior thyroid TR 4 nodule. (Nodules 2 and 5) both meet criteria for biopsy as above.   FNA (11/23/2021):  -Right mid thyroid nodule: FLUS >> Afirma: Benign -Left inferior thyroid nodule: Benign  Reviewed pertinent  tests: Component     Latest Ref Rng & Units 03/22/2021  TSH     0.35 - 5.50 uIU/mL 0.63  FSH     mIU/ML 16.0  Prolactin     ng/mL 3.6  Estradiol     pg/mL 71  T4,Free(Direct)     0.60 - 1.60 ng/dL 0.79   Previous TFTs have also been normal: Lab Results  Component Value Date   TSH 0.63 03/22/2021   TSH 0.41 04/15/2017   Lab Results  Component Value Date   TSH 0.41 04/15/2017   TSH 1.13 09/17/2016   TSH 0.77 12/26/2015   TSH 1.249 01/15/2012   TSH 0.513 08/28/2010   TSH 0.670 05/18/2010   TSH 0.751 05/20/2008   FREET4 0.88 12/26/2015    Her daughter has a history of papillary thyroid cancer. Her mother had 2 pituitary tumors - PRL-producing.  Pt. 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  ROS: + hot flashes, + headaches, + palpitations + see HPI  Past Medical History:  Diagnosis Date   Anemia    Atypical chest pain 09/22/2008   24 Holter Monitor showing NSR while patient endorsing symptoms.     Back pain 02/13/2012   Claudication in peripheral vascular disease ([place]) 05/24/2017   DYSPLASTIC NEVUS, BACK 06/24/2008   Qualifier: Diagnosis of  By: Tommy Medal MD, [place]     Elevated blood pressure reading without diagnosis of hypertension 01/25/2012   Episodic tension type headache 07/21/2013   Family history of coronary artery disease 12/05/2010   FATIGUE 05/18/2010   Qualifier: Diagnosis of  By: Walker Kehr MD, [place]     Headache(784.0) 01/25/2012   Heart murmur    no problems   Hemorrhoids    History of melanoma 04/23/2017   Hot thyroid nodule 2009   needle bx negative   Irregular menses 08/24/2010   Lung abscess ([place]) 01/15/2011   MALIGNANT MELANOMA SKIN TRUNK EXCEPT SCROTUM 09/22/2008   Qualifier: Diagnosis of  By: Tommy Medal MD, [place]     Melanoma [facility])    stage 3 - mid back   Menorrhagia     Migraine headache    Palpitations 05/18/2010   Qualifier: Diagnosis of  By: Walker Kehr MD, [place]     Paroxysmal hypertension 07/21/2013   Pituitary cyst ([place])    Pneumonia, organism unspecified(486) 11/09/2008   Formatting of this note might be different from the original. Pneumonia   Positive PPD    neg chest xray   Smoking 05/24/2017   Solitary pulmonary nodule 06/24/2008    LUL 18 mm nodule - regressed to 71mm 04/2014, resolved 07/2015     SVD (spontaneous vaginal delivery)    x 2   TB lung, latent 06/24/2008   No evidence of active TB     Tobacco abuse counseling 06/23/2008   Vaso vagal episode    WEIGHT LOSS, ABNORMAL 06/24/2008   Qualifier: Diagnosis of  By: Tommy Medal MD, [place]oma L[place]: Diagnosis of  By: Walker Kehr MD, [place]     Past Surgical History:  Procedure Laterality Date   BREAST SURGERY     augmentation   BRONCHOSCOPY     COLONOSCOPY  2010   in [place], [place]-normal exam   DILATION AND CURETTAGE OF UTERUS N/A 10/22/2017   Procedure: DILATATION AND CURETTAGE;  Surgeon: Emily Filbert, MD;  Location: [facility];  Service: Gynecology;  Laterality: N/A;   LUNG REMOVAL, PARTIAL Left 2010   upper left lobe   MEDIASTINOSCOPY     TUBAL LIGATION     UPPER GI ENDOSCOPY  2010   in [place], [place]-normal   wide excision     melanoma removed - mid back   WISDOM TOOTH EXTRACTION     Social History   Socioeconomic History   Marital status: Married    Spouse name: Not on file   Number of children: 2   Years of education: Not on file   Highest education level: Not on file  Occupational History    Employer: [employer]   Occupation: Builds swimming pools  Tobacco Use   Smoking status: Every Day    Packs/day: 1.00    Years: 23.00    Total pack years: 23.00    Types: Cigarettes   Smokeless tobacco: Never  Vaping Use   Vaping Use: Never used  Substance and Sexual Activity   Alcohol use: Not Currently    Comment: Occasional   Drug use: No    Types: Marijuana    Comment: pt  denies   Sexual activity: Yes  Birth control/protection: Surgical  Other Topics Concern   Not on file  Social History Narrative   Not on file   Social Determinants of Health   Financial Resource Strain: Not on file  Food Insecurity: Not on file  Transportation Needs: Not on file  Physical Activity: Not on file  Stress: Not on file  Social Connections: Not on file  Intimate Partner Violence: Not on file   Current Outpatient Medications on File Prior to Visit  Medication Sig Dispense Refill   aspirin EC 81 MG tablet Take 81 mg by mouth daily. Swallow whole.     Aspirin-Salicylamide-Caffeine (BC HEADACHE PO) Take by mouth.     atorvastatin (LIPITOR) 10 MG tablet Take 1 tablet (10 mg total) by mouth daily. 90 tablet 3   clobetasol cream (TEMOVATE) 2.02 % Apply 1 application topically daily.     ibuprofen (ADVIL,MOTRIN) 600 MG tablet Take 1 tablet (600 mg total) by mouth every 6 (six) hours as needed. 30 tablet 0   metoprolol succinate (TOPROL XL) 25 MG 24 hr tablet Take 0.5 tablets (12.5 mg total) by mouth at bedtime. Take extra tablet if having palpitations. 90 tablet 3   No current facility-administered medications on file prior to visit.   No Known Allergies Family History  Problem Relation Age of Onset   COPD Mother    Heart disease Mother    Heart disease Father    Diabetes Father    Heart attack Father    Heart disease Sister    Heart attack Sister    CAD Sister    Adrenal disorder Sister    Heart disease Sister    Supraventricular tachycardia Sister    Heart murmur Daughter    Thyroid cancer Daughter    Pectus carinatum Son    Coronary artery disease Other    Lymphoma Other    Colon cancer Neg Hx    PE: There were no vitals taken for this visit. Wt Readings from Last 3 Encounters:  04/27/22 122 lb (55.3 kg)  03/29/22 121 lb (54.9 kg)  05/31/21 127 lb (57.6 kg)   Constitutional: normal weight, in NAD Eyes:  EOMI, no exophthalmos ENT: no neck masses, no  cervical lymphadenopathy Cardiovascular: Tachycardia, RR, No MRG Respiratory: CTA B Musculoskeletal: no deformities Skin:no rashes Neurological: no tremor with outstretched hands,  ASSESSMENT: 1. Pituitary cyst  2.  Thyroid nodules  3.  H/o Rash - on chest, neck - She wanted to be tested for carcinoid last visit.  She denied diarrhea, wheezing, or other symptoms.  She did describe to cardiac valves affected by valvular insufficiency-no murmur on physical exam - Labs were normal Component     Latest Ref Rng & Units 05/31/2021  IGF-I, LC/MS     52 - 328 ng/mL 148  Z-Score (Female)     -2.0 - 2.0 SD 0.1  Growth Hormone     < OR = 7.1 ng/mL 2.9  C206 ACTH     6 - 50 pg/mL 10  Cortisol, Plasma     ug/dL 7.4  Serotonin, Serum     56 - 244 ng/mL 139  Calcitonin     <=5 pg/mL <2    PLAN:  1. Patient with a small, 2 mm, pituitary cyst, sounds more than 10 years ago during investigation for repeated positive pregnancy tests in the setting of bilateral tubal ligation -I have reviewed the pituitary MRI images and report and explained that this was most likely an incidental finding, especially since  there was no pituitary micro or macroadenoma visible on the MRI. -Her prolactin level was normal, as were her IGF-I, ACTH, cortisol, thyroid test, FSH, estrogen. -At last visit, since she did complain of headaches, we discussed about repeating the pituitary MRI.  She had this on 11/08/2021 and it showed a stable, small, pituitary cyst. -No further investigation is needed for this.  2.  Thyroid nodules -These were small per review of her thyroid ultrasound report from 2011.  Largest nodule is 1.5 cm, in the left thyroid lobe.  Reportedly, this nodule was biopsied with benign results in the past. She had a PET scan for melanoma follow-up which did not show FDG uptake in the neck, which is excellent -At last visit we decided to check a thyroid ultrasound for follow-up of these nodules.  This was  checked on 11/07/2021 and showed 2 dominant nodules, right mid nodule with punctate echogenic foci, measuring 1.6 cm in the left inferior solid, hypoechoic 1.8 cm nodule.  For both of these nodules, biopsy was recommended.  This was done on 11/23/2021. The right mid thyroid nodule biopsy returned indeterminate (FLUS), however, the Afirma molecular marker was benign.  The left inferior thyroid nodule biopsy returned benign. -Today's visit, she has no neck compression symptoms -We will continue to follow her expectantly  Philemon Kingdom, MD PhD [facility] Endocrinology

## 2022-07-18 ENCOUNTER — Ambulatory Visit: Payer: 59 | Admitting: Cardiology

## 2022-10-23 ENCOUNTER — Ambulatory Visit: Payer: Self-pay | Attending: Family Medicine

## 2023-01-23 ENCOUNTER — Ambulatory Visit: Payer: 59 | Admitting: Family

## 2023-01-23 ENCOUNTER — Telehealth: Payer: Self-pay | Admitting: Family Medicine

## 2023-01-23 ENCOUNTER — Encounter: Payer: Self-pay | Admitting: Family

## 2023-01-23 VITALS — BP 140/86 | HR 108 | Temp 98.0°F | Resp 16 | Wt 121.0 lb

## 2023-01-23 DIAGNOSIS — J019 Acute sinusitis, unspecified: Secondary | ICD-10-CM

## 2023-01-23 DIAGNOSIS — J329 Chronic sinusitis, unspecified: Secondary | ICD-10-CM | POA: Insufficient documentation

## 2023-01-23 DIAGNOSIS — J40 Bronchitis, not specified as acute or chronic: Secondary | ICD-10-CM

## 2023-01-23 MED ORDER — CLOBETASOL PROPIONATE 0.05 % EX CREA: 1.0000 | TOPICAL_CREAM | Freq: Two times a day (BID) | CUTANEOUS | 0 refills | Status: AC

## 2023-01-23 MED ORDER — BENZONATATE 100 MG PO CAPS: 100.0000 mg | ORAL_CAPSULE | Freq: Three times a day (TID) | ORAL | 0 refills | Status: DC | PRN

## 2023-01-23 MED ORDER — AMOXICILLIN-POT CLAVULANATE 875-125 MG PO TABS: 1.0000 | ORAL_TABLET | Freq: Two times a day (BID) | ORAL | 0 refills | Status: DC

## 2023-01-23 NOTE — Addendum Note (Signed)
Addended by: Sandford Craze on: 01/23/2023 11:44 AM   Modules accepted: Orders

## 2023-01-23 NOTE — Assessment & Plan Note (Signed)
New.  Rx with augmentin and tessalon prn cough.

## 2023-01-23 NOTE — Patient Instructions (Signed)
VISIT SUMMARY:  During your visit, we discussed your recent symptoms of a cold, vomiting, body aches, and nasal dryness, which you've been experiencing since your trip to the beach. You also mentioned a recent tick bite. We considered your history of [facility] spotted fever, bronchitis, and sinusitis in our assessment.  YOUR PLAN:  -UPPER RESPIRATORY INFECTION: Your symptoms suggest you may have a viral illness that could have turned into a bacterial sinus infection and bronchitis. We don't see any signs of pneumonia. We're prescribing Augmentin, an antibiotic, to treat suspected bronchitis and sinusitis. We're also providing cough pearls to help with your symptoms. Bronchitis and sinusitis are inflammations of the bronchial tubes and sinuses, respectively, often caused by infections.  -TICK BITE: you had a recent tick bite which you removed. There are no signs of infection or Lyme disease at this time. We will continue to monitor for now. Lyme disease is an infection caused by bacteria usually transmitted through tick bites.  INSTRUCTIONS:  Please take your prescribed medication as directed. If your symptoms do not improve in 2-3 days or if your condition worsens, please contact our office immediately.

## 2023-01-23 NOTE — Telephone Encounter (Signed)
Upon check out patient forgot to ask provide to refill clobetasol cream (TEMOVATE) 0.05 % .

## 2023-01-23 NOTE — Telephone Encounter (Signed)
Refill sent.

## 2023-01-23 NOTE — Telephone Encounter (Signed)
Okay for refill?  

## 2023-01-23 NOTE — Progress Notes (Signed)
Subjective:     Patient ID: [name], female    DOB: 1975-12-27, 47 y.o.   MRN: [id number]  Chief Complaint  Patient presents with   Insect Bite    Patient repots finding a tick under left arm 7/04.     URI    Patient complains of cold symptoms that started 7/03.     URI     Discussed the use of AI scribe software for clinical note transcription with the patient, who gave verbal consent to proceed.  History of Present Illness   The patient, presents with symptoms of a cold and a recent tick bite in the left axilla.  The patient reports feeling unwell, with symptoms including vomiting, body aches, nasal dryness, and a general feeling of malaise. The symptoms began 6/29 while [place] the [place]. Her initial symptom was nausea and vomiting which woke her up in the middle of the night.  The patient felt fine the next day, but upon returning home, she began to feel like she was getting a cold. The patient woke up vomiting again and felt generally unwell, with body aches. The patient also reports a history of bronchitis and sinusitis, and is currently experiencing similar symptoms, including coughing up mucus and feeling pressure in her head. Cough is keeping her up at night. The patient has been trying to manage her symptoms with over-the-counter medications, but feels that she may need an antibiotic.      She did not test for covid during this illness.   Health Maintenance Due  Topic Date Due   COVID-19 Vaccine (1) Never done   Colonoscopy  Never done   PAP SMEAR-Modifier  09/06/2020    Past Medical History:  Diagnosis Date   Anemia    Atypical chest pain 09/22/2008   24 Holter Monitor showing NSR while patient endorsing symptoms.     Back pain 02/13/2012   Claudication in peripheral vascular disease (HCC) 05/24/2017   DYSPLASTIC NEVUS, BACK 06/24/2008   Qualifier: Diagnosis of  By: Daiva Eves MD, Cornelius     Elevated blood pressure reading without  diagnosis of hypertension 01/25/2012   Emphysema of lung (HCC)    Episodic tension type headache 07/21/2013   Family history of coronary artery disease 12/05/2010   FATIGUE 05/18/2010   Qualifier: Diagnosis of  By: Sheffield Slider MD, Wayne     Headache(784.0) 01/25/2012   Heart murmur    no problems   Hemorrhoids    History of melanoma 04/23/2017   Hot thyroid nodule 2009   needle bx negative   Irregular menses 08/24/2010   Lung abscess (HCC) 01/15/2011   MALIGNANT MELANOMA SKIN TRUNK EXCEPT SCROTUM 09/22/2008   Qualifier: Diagnosis of  By: Daiva Eves MD, Cornelius     Melanoma [facility])    stage 3 - mid back   Menorrhagia    Migraine headache    Palpitations 05/18/2010   Qualifier: Diagnosis of  By: Sheffield Slider MD, Wayne     Paroxysmal hypertension 07/21/2013   Pituitary cyst (HCC)    Pneumonia, organism unspecified(486) 11/09/2008   Formatting of this note might be different from the original. 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  No evidence of active TB     Tobacco abuse counseling 06/23/2008   Vaso vagal episode    WEIGHT LOSS, ABNORMAL 06/24/2008   Qualifier: Diagnosis of  By: Daiva Eves MD, Aleda Grana: Diagnosis of  By: Sheffield Slider MD, Deniece Portela      Past Surgical History:  Procedure Laterality Date   BREAST SURGERY     augmentation   BRONCHOSCOPY     COLONOSCOPY  2010   in [place], [place]-normal exam   DILATION AND CURETTAGE OF UTERUS N/A 10/22/2017   Procedure: DILATATION AND CURETTAGE;  Surgeon: Allie Bossier, MD;  Location: [facility];  Service: Gynecology;  Laterality: N/A;   LUNG REMOVAL, PARTIAL Left 2010   upper left lobe   MEDIASTINOSCOPY     TUBAL LIGATION     UPPER GI ENDOSCOPY  2010   in [place], [place]-normal   wide excision     melanoma removed - mid back    WISDOM TOOTH EXTRACTION      Family History  Problem Relation Age of Onset   COPD Mother    Heart disease Mother    Heart disease Father    Diabetes Father    Heart attack Father    Heart disease Sister    Heart attack Sister    CAD Sister    Adrenal disorder Sister    Heart disease Sister    Supraventricular tachycardia Sister    Heart murmur Daughter    Thyroid cancer Daughter    Pectus carinatum Son    Coronary artery disease Other    Lymphoma Other    Colon cancer Neg Hx     Social History   Socioeconomic History   Marital status: Married    Spouse name: Not on file   Number of children: 2   Years of education: Not on file   Highest education level: Not on file  Occupational History    Employer: [employer]   Occupation: Builds swimming pools  Tobacco Use   Smoking status: Every Day    Packs/day: 1.00    Years: 23.00    Additional pack years: 0.00    Total pack years: 23.00    Types: Cigarettes   Smokeless tobacco: Never  Vaping Use   Vaping Use: Never used  Substance and Sexual Activity   Alcohol use: Not Currently    Comment: Occasional   Drug use: No    Types: Marijuana    Comment: pt denies   Sexual activity: Yes    Birth control/protection: Surgical  Other Topics Concern   Not on file  Social History Narrative   Not on file   Social Determinants of Health   Financial Resource Strain: Not on file  Food Insecurity: Not on file  Transportation Needs: Not on file  Physical Activity: Not on file  Stress: Not on file  Social Connections: Not on file  Intimate Partner Violence: Not on file    Outpatient Medications Prior to Visit  Medication Sig Dispense Refill   aspirin EC 81 MG tablet Take 81 mg by mouth daily. Swallow whole.     Aspirin-Salicylamide-Caffeine (BC HEADACHE PO) Take by mouth.     atorvastatin (LIPITOR) 10 MG tablet Take 1 tablet (10 mg total) by mouth daily. 90 tablet 3   clobetasol cream (TEMOVATE) 0.05 % Apply 1 application  topically daily.     ibuprofen (ADVIL,MOTRIN) 600 MG tablet Take 1 tablet (600 mg total) by mouth every 6 (six) hours as needed. 30  tablet 0   metoprolol succinate (TOPROL XL) 25 MG 24 hr tablet Take 0.5 tablets (12.5 mg total) by mouth at bedtime. Take extra tablet if having palpitations. 90 tablet 3   No facility-administered medications prior to visit.    No Known Allergies  ROS    See HPI Objective:    Physical Exam Constitutional:      General: She is not in acute distress.    Appearance: Normal appearance. She is well-developed.  HENT:     Head: Normocephalic and atraumatic.     Right Ear: Tympanic membrane, ear canal and external ear normal.     Left Ear: Tympanic membrane, ear canal and external ear normal.     Nose:     Right Sinus: No maxillary sinus tenderness or frontal sinus tenderness.     Left Sinus: No maxillary sinus tenderness or frontal sinus tenderness.     Mouth/Throat:     Mouth: Mucous membranes are moist.     Pharynx: No posterior oropharyngeal erythema.  Eyes:     General: No scleral icterus. Neck:     Thyroid: No thyromegaly.  Cardiovascular:     Rate and Rhythm: Normal rate and regular rhythm.     Heart sounds: Normal heart sounds. No murmur heard. Pulmonary:     Effort: Pulmonary effort is normal. No respiratory distress.     Breath sounds: Normal breath sounds. No wheezing.  Musculoskeletal:     Cervical back: Neck supple.  Skin:    General: Skin is warm and dry.     Comments: Small scab left axilla at site of previous tick bite  Neurological:     Mental Status: She is alert and oriented to person, place, and time.  Psychiatric:        Mood and Affect: Mood normal.        Behavior: Behavior normal.        Thought Content: Thought content normal.        Judgment: Judgment normal.      BP (!) 140/86 (BP Location: Left Arm, Patient Position: Sitting, Cuff Size: Small)   Pulse (!) 108   Temp 98 F (36.7 C) (Oral)   Resp 16   Wt 121  lb (54.9 kg)   SpO2 98%   BMI 20.14 kg/m  Wt Readings from Last 3 Encounters:  01/23/23 121 lb (54.9 kg)  04/27/22 122 lb (55.3 kg)  03/29/22 121 lb (54.9 kg)       Assessment & Plan:   Problem List Items Addressed This Visit       Unprioritized   Sinusitis - Primary    New. Rx with augmentin.       Relevant Medications   amoxicillin-clavulanate (AUGMENTIN) 875-125 MG tablet   benzonatate (TESSALON) 100 MG capsule   Bronchitis    New.  Rx with augmentin and tessalon prn cough.        I am having [name] start on amoxicillin-clavulanate and benzonatate. I am also having her maintain her ibuprofen, clobetasol cream, Aspirin-Salicylamide-Caffeine (BC HEADACHE PO), aspirin EC, metoprolol succinate, and atorvastatin.  Meds ordered this encounter  Medications   amoxicillin-clavulanate (AUGMENTIN) 875-125 MG tablet    Sig: Take 1 tablet by mouth 2 (two) times daily.    Dispense:  20 tablet    Refill:  0    Order Specific Question:   Supervising Provider    Answer:   Danise Edge A [[id number]]   benzonatate (TESSALON) 100 MG capsule  Sig: Take 1 capsule (100 mg total) by mouth 3 (three) times daily as needed.    Dispense:  20 capsule    Refill:  0    Order Specific Question:   Supervising Provider    Answer:   Danise Edge A [[id number]]

## 2023-01-23 NOTE — Assessment & Plan Note (Signed)
New. Rx with augmentin.  

## 2023-01-28 ENCOUNTER — Encounter: Payer: Self-pay | Admitting: Family

## 2023-01-28 MED ORDER — DOXYCYCLINE HYCLATE 100 MG PO TABS: 100.0000 mg | ORAL_TABLET | Freq: Two times a day (BID) | ORAL | 0 refills | Status: AC

## 2023-05-14 NOTE — Progress Notes (Deleted)
[facility] [address] [place], [place] [address] [phone]  Date:  05/15/2023   Name:  [name]   DOB:  06/28/1976   MRN:  [id number]  PCP:  Pearline Cables, MD    Chief Complaint: No chief complaint on file.   History of Present Illness:  [name] is a 47 y.o. very pleasant female patient who presents with the following:  Patient seen today with concern of back injury Most recent visit with myself was a virtual visit in 2023 when she was ill with COVID Our last in person visit together was in September 2022  Note from cardiology from earlier this month: [name] is a 47 y.o. female with PMH of HTN, lung abscess s/p post lobectomy 2010, tobacco use, premature family history of CAD, atypical chest pain, melanoma, migraines, heart murmur who presents today for 4-week follow-up of complaint of jaw pain and chest pain.  1.nonobstructive CAD /atypical chest pain: -Today patient reports no neck or chest pain since previous visit -Cardiac CTA completed that showed no calcium and normal coronaries. -Patient advised to contact our office if chest pain persist and go to the ED if not resolved or becomes worse in intensity -We will check lipids and LFTs today -Continue GDMT with ASA 81 mg and plan to resume atorvastatin 10 mg following lipid results 2.  Palpitations: - 2D echo was performed 05/2017 that revealed EF of 55-65%, grade 1 DD, no RWMA, with trivial TV regurgitation.  -Results of ZIO monitor were similar to 2018 results showing no arrhythmias -Patient instructed to take extra 12.5 mg of Toprol-XL for breakthrough palpitations 3.  Elevated blood pressure: -Patient reported elevated blood pressures in the 150s and 160s at home and pressure during previous visit was 140/84. -Today patient's pressure was 124/78 well-controlled -Patient advised to continue to check blood pressures at home and contact  office for systolics remaining above 140 -Continue Toprol-XL as noted above 4.  Tobacco abuse: -Smoking cessation advised and patient encouraged to contact our office for any support 5.  Arterial fibromuscular dysplasia: -Patient had carotid Dopplers completed with no evidence of stenosis -Patient had evidence of dysplasia with bleeding and bilateral carotids. -I will forward to primary cardiologist for recommendations for treatment plan      Patient Active Problem List   Diagnosis Date Noted   Sinusitis 01/23/2023   Bronchitis 01/23/2023   Vaso vagal episode    SVD (spontaneous vaginal delivery)    Positive PPD    Pituitary cyst (HCC)    Migraine headache    Menorrhagia    Melanoma (HCC)    Heart murmur    Anemia    Claudication in peripheral vascular disease (HCC) 05/24/2017   Smoking 05/24/2017   History of melanoma 04/23/2017   Episodic tension type headache 07/21/2013   Paroxysmal hypertension 07/21/2013   Back pain 02/13/2012   Headache 01/25/2012   Endocrine disorder 01/25/2012   Elevated blood pressure reading without diagnosis of hypertension 01/25/2012   Lung abscess (HCC) 01/15/2011   Family history of coronary artery disease 12/05/2010   Dizziness 09/08/2010   FATIGUE 05/18/2010   PALPITATIONS 05/18/2010   Pneumonia, organism unspecified(486) 11/09/2008   MALIGNANT MELANOMA SKIN TRUNK EXCEPT SCROTUM 09/22/2008   Atypical chest pain 09/22/2008   DYSPLASTIC NEVUS, BACK 06/24/2008   Solitary pulmonary nodule 06/24/2008   TB lung, 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     Back pain 02/13/2012   Claudication in peripheral vascular disease (HCC) 05/24/2017   DYSPLASTIC NEVUS, BACK 06/24/2008   Qualifier: Diagnosis of  By: Daiva Eves  MD, Cornelius     Elevated blood pressure reading without diagnosis of hypertension 01/25/2012   Emphysema of lung (HCC)    Episodic tension type headache 07/21/2013   Family history of coronary artery disease 12/05/2010   FATIGUE 05/18/2010   Qualifier: Diagnosis of  By: Sheffield Slider MD, Wayne     Headache(784.0) 01/25/2012   Heart murmur    no problems   Hemorrhoids    History of melanoma 04/23/2017   Hot thyroid nodule 2009   needle bx negative   Irregular menses 08/24/2010   Lung abscess (HCC) 01/15/2011   MALIGNANT MELANOMA SKIN TRUNK EXCEPT SCROTUM 09/22/2008   Qualifier: Diagnosis of  By: Daiva Eves MD, Cornelius     Melanoma [facility])    stage 3 - mid back   Menorrhagia    Migraine headache    Palpitations 05/18/2010   Qualifier: Diagnosis of  By: Sheffield Slider MD, Wayne     Paroxysmal hypertension 07/21/2013   Pituitary cyst (HCC)    Pneumonia, organism unspecified(486) 11/09/2008   Formatting of this note might be different from the original. 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   No evidence of active TB     Tobacco abuse counseling 06/23/2008   Vaso vagal episode    WEIGHT LOSS, ABNORMAL 06/24/2008   Qualifier: Diagnosis of  By: Daiva Eves MD, Aleda Grana: Diagnosis of  By: Sheffield Slider MD, Deniece Portela      Past Surgical History:  Procedure Laterality Date   BREAST SURGERY     augmentation   BRONCHOSCOPY     COLONOSCOPY  2010   in [place], [place]-normal exam   DILATION AND CURETTAGE OF UTERUS N/A 10/22/2017   Procedure: DILATATION AND CURETTAGE;  Surgeon: Allie Bossier, MD;  Location: [facility];  Service: Gynecology;  Laterality: N/A;   LUNG REMOVAL, PARTIAL Left 2010   upper left lobe   MEDIASTINOSCOPY     TUBAL LIGATION     UPPER GI ENDOSCOPY  2010   in [place],  -normal   wide excision     melanoma removed - mid back   WISDOM TOOTH EXTRACTION      Social History   Tobacco Use   Smoking status: Every Day    Current packs/day: 1.00    Average packs/day: 1 pack/day for 23.0 years (23.0 ttl pk-yrs)    Types: Cigarettes   Smokeless tobacco: Never  Vaping Use   Vaping status: Never Used  Substance Use Topics   Alcohol use: Not Currently    Comment: Occasional   Drug use: No    Types: Marijuana    Comment: pt denies    Family History  Problem Relation Age of Onset   COPD Mother    Heart disease Mother    Heart disease Father    Diabetes  Father    Heart attack Father    Heart disease Sister    Heart attack Sister    CAD Sister    Adrenal disorder Sister    Heart disease Sister    Supraventricular tachycardia Sister    Heart murmur Daughter    Thyroid cancer Daughter    Pectus carinatum Son    Coronary artery disease Other    Lymphoma Other    Colon cancer Neg Hx     Allergies  Allergen Reactions   Augmentin [Amoxicillin-Pot Clavulanate]     Diarrhea and anorexia    Medication list has been reviewed and updated.  Current Outpatient Medications on File Prior to Visit  Medication Sig Dispense Refill   aspirin EC 81 MG tablet Take 81 mg by mouth daily. Swallow whole.     Aspirin-Salicylamide-Caffeine (BC HEADACHE PO) Take by mouth.     atorvastatin (LIPITOR) 10 MG tablet Take 1 tablet (10 mg total) by mouth daily. 90 tablet 3   benzonatate (TESSALON) 100 MG capsule Take 1 capsule (100 mg total) by mouth 3 (three) times daily as needed. 20 capsule 0   clobetasol cream (TEMOVATE) 0.05 % Apply 1 Application topically 2 (two) times daily. 30 g 0   ibuprofen (ADVIL,MOTRIN) 600 MG tablet Take 1 tablet (600 mg total) by mouth every 6 (six) hours as needed. 30 tablet 0   metoprolol succinate (TOPROL XL) 25 MG 24 hr tablet Take 0.5 tablets (12.5 mg total) by mouth at bedtime. Take extra tablet if having palpitations. 90 tablet 3    No current facility-administered medications on file prior to visit.    Review of Systems:  As per HPI- otherwise negative.   Physical Examination: There were no vitals filed for this visit. There were no vitals filed for this visit. There is no height or weight on file to calculate BMI. Ideal Body Weight:    GEN: no acute distress. HEENT: Atraumatic, Normocephalic.  Ears and Nose: No external deformity. CV: RRR, No M/G/R. No JVD. No thrill. No extra heart sounds. PULM: CTA B, no wheezes, crackles, rhonchi. No retractions. No resp. distress. No accessory muscle use. ABD: S, NT, ND, +BS. No rebound. No HSM. EXTR: No c/c/e PSYCH: Normally interactive. Conversant.    Assessment and Plan: ***  Signed Abbe Amsterdam, MD

## 2023-05-15 ENCOUNTER — Ambulatory Visit: Payer: 59 | Admitting: Family Medicine

## 2023-05-16 ENCOUNTER — Encounter: Payer: 59 | Admitting: Family Medicine

## 2023-05-19 NOTE — Patient Instructions (Incomplete)
It was good to see you again today, I will be in touch with your labs soon as possible If not done already recommend update colon cancer screening, COVID booster Mammo ordered today- please stop at imaging to schedule Flu shot today   Please find out where you did your last colonoscopy- we need to get these records I can request a copy of your record if you can provide me with the name of the doctor or office!  Once we have this we can see if you are due and get you referred

## 2023-05-19 NOTE — Progress Notes (Unsigned)
[facility] [address] [place], [place] [address] [phone]  Date:  05/22/2023   Name:  [name]   DOB:  26-Feb-1976   MRN:  [id number]  PCP:  Pearline Cables, MD    Chief Complaint: No chief complaint on file.   History of Present Illness:  [name] is a 47 y.o. very pleasant female patient who presents with the following:  Patient seen today for physical exam Most recent visit with myself was a virtual visit in January 2023 for COVID, I last saw her in person 2 years ago  History of hypertension, melanoma, lung abscess status post lobectomy in 2010 and family history of CAD, tobacco use   She was seen by cardiology in October of last year for follow-up of atypical pain They noted a cardiac CTA which was negative, normal echo in 2018  Pap smear-negative Pap on chart from 2019, due for update Mammogram Flu shot COVID booster Colon cancer screening  Update labs today  Patient Active Problem List   Diagnosis Date Noted   Sinusitis 01/23/2023   Bronchitis 01/23/2023   Vaso vagal episode    SVD (spontaneous vaginal delivery)    Positive PPD    Pituitary cyst (HCC)    Migraine headache    Menorrhagia    Melanoma (HCC)    Heart murmur    Anemia    Claudication in peripheral vascular disease (HCC) 05/24/2017   Smoking 05/24/2017   History of melanoma 04/23/2017   Episodic tension type headache 07/21/2013   Paroxysmal hypertension 07/21/2013   Back pain 02/13/2012   Headache 01/25/2012   Endocrine disorder 01/25/2012   Elevated blood pressure reading without diagnosis of hypertension 01/25/2012   Lung abscess (HCC) 01/15/2011   Family history of coronary artery disease 12/05/2010   Dizziness 09/08/2010   FATIGUE 05/18/2010   PALPITATIONS 05/18/2010   Pneumonia, organism unspecified(486) 11/09/2008   MALIGNANT MELANOMA SKIN TRUNK EXCEPT SCROTUM 09/22/2008   Atypical chest pain 09/22/2008    DYSPLASTIC NEVUS, BACK 06/24/2008   Solitary pulmonary nodule 06/24/2008   TB lung, 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     Back pain 02/13/2012   Claudication in peripheral vascular disease (HCC) 05/24/2017   DYSPLASTIC NEVUS, BACK 06/24/2008   Qualifier: Diagnosis of  By: Daiva Eves MD, Cornelius     Elevated blood pressure reading without diagnosis of hypertension 01/25/2012   Emphysema of lung (HCC)    Episodic tension type headache 07/21/2013   Family history of coronary artery disease 12/05/2010   FATIGUE 05/18/2010   Qualifier: Diagnosis of  By: Sheffield Slider MD, Wayne     Headache(784.0) 01/25/2012   Heart murmur    no problems   Hemorrhoids    History of melanoma 04/23/2017   Hot thyroid nodule 2009   needle bx negative   Irregular menses 08/24/2010   Lung abscess (HCC) 01/15/2011   MALIGNANT MELANOMA SKIN TRUNK EXCEPT SCROTUM 09/22/2008   Qualifier: Diagnosis of  By: Daiva Eves MD, Cornelius     Melanoma [facility])    stage 3 - mid back   Menorrhagia    Migraine headache    Palpitations 05/18/2010   Qualifier:  Diagnosis of  By: Sheffield Slider MD, Wayne     Paroxysmal hypertension 07/21/2013   Pituitary cyst (HCC)    Pneumonia, organism unspecified(486) 11/09/2008   Formatting of this note might be different from the original. 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   No evidence of active TB     Tobacco abuse counseling 06/23/2008   Vaso vagal episode    WEIGHT LOSS, ABNORMAL 06/24/2008   Qualifier: Diagnosis of  By: Daiva Eves MD, Aleda Grana:  Diagnosis of  By: Sheffield Slider MD, Deniece Portela      Past Surgical History:  Procedure Laterality Date   BREAST SURGERY     augmentation   BRONCHOSCOPY     COLONOSCOPY  2010   in [place], [place]-normal exam   DILATION AND CURETTAGE OF UTERUS N/A 10/22/2017   Procedure: DILATATION AND CURETTAGE;  Surgeon: Allie Bossier, MD;  Location: [facility];  Service: Gynecology;  Laterality: N/A;   LUNG REMOVAL, PARTIAL Left 2010   upper left lobe   MEDIASTINOSCOPY     TUBAL LIGATION     UPPER GI ENDOSCOPY  2010   in [place], [place]-normal   wide excision     melanoma removed - mid back   WISDOM TOOTH EXTRACTION      Social History   Tobacco Use   Smoking status: Every Day    Current packs/day: 1.00    Average packs/day: 1 pack/day for 23.0 years (23.0 ttl pk-yrs)    Types: Cigarettes   Smokeless tobacco: Never  Vaping Use   Vaping status: Never Used  Substance Use Topics   Alcohol use: Not Currently    Comment: Occasional   Drug use: No    Types: Marijuana    Comment: pt denies    Family History  Problem Relation Age of Onset   COPD Mother    Heart disease Mother    Heart disease Father    Diabetes Father    Heart attack Father    Heart disease Sister    Heart attack Sister    CAD Sister    Adrenal disorder Sister    Heart disease Sister    Supraventricular tachycardia Sister    Heart murmur Daughter    Thyroid cancer Daughter    Pectus carinatum Son    Coronary artery disease Other    Lymphoma Other    Colon cancer Neg Hx     Allergies  Allergen Reactions   Augmentin [Amoxicillin-Pot Clavulanate]     Diarrhea and anorexia    Medication list has been reviewed and updated.  Current Outpatient Medications on File Prior to Visit  Medication Sig Dispense Refill   aspirin EC 81 MG tablet Take 81 mg by mouth daily. Swallow whole.     Aspirin-Salicylamide-Caffeine (BC HEADACHE PO) Take by mouth.     atorvastatin (LIPITOR) 10 MG tablet Take 1 tablet (10 mg total) by mouth  daily. 90 tablet 3   benzonatate (TESSALON) 100 MG capsule Take 1 capsule (100 mg total) by mouth 3 (three) times daily as needed. 20 capsule 0   clobetasol cream (TEMOVATE) 0.05 % Apply 1 Application topically 2 (two) times daily. 30 g 0   ibuprofen (ADVIL,MOTRIN) 600 MG tablet Take  1 tablet (600 mg total) by mouth every 6 (six) hours as needed. 30 tablet 0   metoprolol succinate (TOPROL XL) 25 MG 24 hr tablet Take 0.5 tablets (12.5 mg total) by mouth at bedtime. Take extra tablet if having palpitations. 90 tablet 3   No current facility-administered medications on file prior to visit.    Review of Systems:  As per HPI- otherwise negative.   Physical Examination: There were no vitals filed for this visit. There were no vitals filed for this visit. There is no height or weight on file to calculate BMI. Ideal Body Weight:    GEN: no acute distress. HEENT: Atraumatic, Normocephalic.  Ears and Nose: No external deformity. CV: RRR, No M/G/R. No JVD. No thrill. No extra heart sounds. PULM: CTA B, no wheezes, crackles, rhonchi. No retractions. No resp. distress. No accessory muscle use. ABD: S, NT, ND, +BS. No rebound. No HSM. EXTR: No c/c/e PSYCH: Normally interactive. Conversant.    Assessment and Plan: *** Physical exam today.  Encouraged healthy diet and exercise routine Will plan further follow- up pending labs.  Signed Abbe Amsterdam, MD

## 2023-05-22 ENCOUNTER — Ambulatory Visit (INDEPENDENT_AMBULATORY_CARE_PROVIDER_SITE_OTHER): Payer: 59 | Admitting: Family Medicine

## 2023-05-22 ENCOUNTER — Encounter: Payer: Self-pay | Admitting: Family Medicine

## 2023-05-22 ENCOUNTER — Other Ambulatory Visit (HOSPITAL_COMMUNITY)
Admission: RE | Admit: 2023-05-22 | Discharge: 2023-05-22 | Disposition: A | Discharge status: Home / Self Care | Payer: 59 | Source: Ambulatory Visit | Attending: Family Medicine | Admitting: Family Medicine

## 2023-05-22 VITALS — BP 122/60 | HR 80 | Temp 97.7°F | Resp 18 | Ht 62.0 in | Wt 127.8 lb

## 2023-05-22 DIAGNOSIS — Z13220 Encounter for screening for lipoid disorders: Secondary | ICD-10-CM

## 2023-05-22 DIAGNOSIS — Z130 Encounter for screening for diseases of the blood and blood-forming organs and certain disorders involving the immune mechanism: Secondary | ICD-10-CM | POA: Diagnosis not present

## 2023-05-22 DIAGNOSIS — Z1329 Encounter for screening for other suspected endocrine disorder: Secondary | ICD-10-CM | POA: Diagnosis not present

## 2023-05-22 DIAGNOSIS — R202 Paresthesia of skin: Secondary | ICD-10-CM | POA: Diagnosis not present

## 2023-05-22 DIAGNOSIS — Z124 Encounter for screening for malignant neoplasm of cervix: Secondary | ICD-10-CM

## 2023-05-22 DIAGNOSIS — Z1211 Encounter for screening for malignant neoplasm of colon: Secondary | ICD-10-CM

## 2023-05-22 DIAGNOSIS — R002 Palpitations: Secondary | ICD-10-CM

## 2023-05-22 DIAGNOSIS — Z131 Encounter for screening for diabetes mellitus: Secondary | ICD-10-CM | POA: Diagnosis not present

## 2023-05-22 DIAGNOSIS — Z0000 Encounter for general adult medical examination without abnormal findings: Secondary | ICD-10-CM | POA: Diagnosis not present

## 2023-05-22 DIAGNOSIS — Z23 Encounter for immunization: Secondary | ICD-10-CM

## 2023-05-22 DIAGNOSIS — R5383 Other fatigue: Secondary | ICD-10-CM

## 2023-05-22 DIAGNOSIS — E785 Hyperlipidemia, unspecified: Secondary | ICD-10-CM

## 2023-05-22 DIAGNOSIS — Z1231 Encounter for screening mammogram for malignant neoplasm of breast: Secondary | ICD-10-CM

## 2023-05-22 LAB — COMPREHENSIVE METABOLIC PANEL
ALT: 16 U/L (ref 0–35)
AST: 18 U/L (ref 0–37)
Albumin: 4.3 g/dL (ref 3.5–5.2)
Alkaline Phosphatase: 53 U/L (ref 39–117)
BUN: 10 mg/dL (ref 6–23)
CO2: 27 meq/L (ref 19–32)
Calcium: 9.3 mg/dL (ref 8.4–10.5)
Chloride: 103 meq/L (ref 96–112)
Creatinine, Ser: 0.81 mg/dL (ref 0.40–1.20)
GFR: 85.9 mL/min (ref >60.00)
Glucose, Bld: 82 mg/dL (ref 70–99)
Potassium: 3.8 meq/L (ref 3.5–5.1)
Sodium: 137 meq/L (ref 135–145)
Total Bilirubin: 0.7 mg/dL (ref 0.2–1.2)
Total Protein: 6.9 g/dL (ref 6.0–8.3)

## 2023-05-22 LAB — HEMOGLOBIN A1C: Hgb A1c MFr Bld: 5.7 % (ref 4.6–6.5)

## 2023-05-22 LAB — CBC
HCT: 42 % (ref 36.0–46.0)
Hemoglobin: 14 g/dL (ref 12.0–15.0)
MCHC: 33.3 g/dL (ref 30.0–36.0)
MCV: 91.6 fL (ref 78.0–100.0)
Platelets: 177 10*3/uL (ref 150.0–400.0)
RBC: 4.58 Mil/uL (ref 3.87–5.11)
RDW: 13.1 % (ref 11.5–15.5)
WBC: 7.5 10*3/uL (ref 4.0–10.5)

## 2023-05-22 LAB — LIPID PANEL
Cholesterol: 139 mg/dL (ref 0–200)
HDL: 60.4 mg/dL (ref >39.00)
LDL Cholesterol: 67 mg/dL (ref 0–99)
NonHDL: 78.5
Total CHOL/HDL Ratio: 2
Triglycerides: 59 mg/dL (ref 0.0–149.0)
VLDL: 11.8 mg/dL (ref 0.0–40.0)

## 2023-05-22 LAB — FOLATE: Folate: 16.3 ng/mL (ref >5.9)

## 2023-05-22 LAB — VITAMIN D 25 HYDROXY (VIT D DEFICIENCY, FRACTURES): VITD: 24.02 ng/mL — ABNORMAL LOW (ref 30.00–100.00)

## 2023-05-22 LAB — VITAMIN B12: Vitamin B-12: 345 pg/mL (ref 211–911)

## 2023-05-22 LAB — FERRITIN: Ferritin: 29.3 ng/mL (ref 10.0–291.0)

## 2023-05-22 LAB — TSH: TSH: 0.51 u[IU]/mL (ref 0.35–5.50)

## 2023-05-22 MED ORDER — ATORVASTATIN CALCIUM 10 MG PO TABS: 10.0000 mg | ORAL_TABLET | Freq: Every day | ORAL | 3 refills | Status: DC

## 2023-05-22 MED ORDER — METOPROLOL SUCCINATE ER 25 MG PO TB24: 12.5000 mg | ORAL_TABLET | Freq: Every day | ORAL | 3 refills | Status: DC

## 2023-05-23 NOTE — Telephone Encounter (Signed)
I've sent request for colonoscopy.   Please advise on vit d.

## 2023-05-24 ENCOUNTER — Encounter: Payer: Self-pay | Admitting: Family Medicine

## 2023-05-24 LAB — CYTOLOGY - PAP
Comment: NEGATIVE
Diagnosis: NEGATIVE
High risk HPV: NEGATIVE

## 2023-05-24 NOTE — Addendum Note (Signed)
Addended by: Abbe Amsterdam C on: 05/24/2023 04:59 AM   Modules accepted: Orders

## 2023-05-27 ENCOUNTER — Encounter: Payer: Self-pay | Admitting: Family Medicine

## 2023-05-28 ENCOUNTER — Encounter (HOSPITAL_BASED_OUTPATIENT_CLINIC_OR_DEPARTMENT_OTHER): Payer: Self-pay

## 2023-05-28 ENCOUNTER — Ambulatory Visit (HOSPITAL_BASED_OUTPATIENT_CLINIC_OR_DEPARTMENT_OTHER)
Admission: RE | Admit: 2023-05-28 | Discharge: 2023-05-28 | Disposition: A | Discharge status: Home / Self Care | Payer: 59 | Source: Ambulatory Visit | Attending: Family Medicine | Admitting: Family Medicine

## 2023-05-28 DIAGNOSIS — Z1231 Encounter for screening mammogram for malignant neoplasm of breast: Secondary | ICD-10-CM | POA: Diagnosis not present

## 2023-05-31 ENCOUNTER — Other Ambulatory Visit: Payer: Self-pay | Admitting: Family Medicine

## 2023-05-31 DIAGNOSIS — R928 Other abnormal and inconclusive findings on diagnostic imaging of breast: Secondary | ICD-10-CM

## 2023-06-06 ENCOUNTER — Other Ambulatory Visit: Payer: Self-pay | Admitting: Medical Genetics

## 2023-06-06 DIAGNOSIS — Z006 Encounter for examination for normal comparison and control in clinical research program: Secondary | ICD-10-CM

## 2023-06-18 ENCOUNTER — Ambulatory Visit
Admission: RE | Admit: 2023-06-18 | Discharge: 2023-06-18 | Disposition: A | Discharge status: Home / Self Care | Payer: 59 | Source: Ambulatory Visit | Attending: Family Medicine | Admitting: Family Medicine

## 2023-06-18 DIAGNOSIS — N6321 Unspecified lump in the left breast, upper outer quadrant: Secondary | ICD-10-CM | POA: Diagnosis not present

## 2023-06-18 DIAGNOSIS — R928 Other abnormal and inconclusive findings on diagnostic imaging of breast: Secondary | ICD-10-CM

## 2023-06-18 DIAGNOSIS — N6002 Solitary cyst of left breast: Secondary | ICD-10-CM | POA: Diagnosis not present

## 2023-06-18 DIAGNOSIS — N6322 Unspecified lump in the left breast, upper inner quadrant: Secondary | ICD-10-CM | POA: Diagnosis not present

## 2023-07-31 ENCOUNTER — Ambulatory Visit (AMBULATORY_SURGERY_CENTER): Payer: 59

## 2023-07-31 VITALS — Ht 65.5 in | Wt 128.0 lb

## 2023-07-31 DIAGNOSIS — Z1211 Encounter for screening for malignant neoplasm of colon: Secondary | ICD-10-CM

## 2023-07-31 MED ORDER — NA SULFATE-K SULFATE-MG SULF 17.5-3.13-1.6 GM/177ML PO SOLN: 1.0000 | Freq: Once | ORAL | 0 refills | Status: AC

## 2023-07-31 NOTE — Progress Notes (Signed)

## 2023-08-09 ENCOUNTER — Encounter: Payer: Self-pay | Admitting: Internal Medicine

## 2023-08-13 ENCOUNTER — Ambulatory Visit: Payer: Self-pay | Admitting: Family Medicine

## 2023-08-13 ENCOUNTER — Ambulatory Visit: Payer: 59 | Admitting: Family

## 2023-08-13 ENCOUNTER — Telehealth: Payer: Self-pay | Admitting: Internal Medicine

## 2023-08-13 NOTE — Telephone Encounter (Signed)
patient needing updated prep instructions for rescheduled procedure.

## 2023-08-13 NOTE — Telephone Encounter (Signed)
Copied from CRM [id number]. Topic: Clinical - Red Word Triage >> Aug 13, 2023  8:10 AM [name] wrote: Red Word that prompted transfer to Nurse Triage: Patient called in due to having Diarrhea for 2 wks / chest pain when breathing in and out only on left side   Chief Complaint: chest pain x 4 days; diarrhea x 2 weeks Symptoms: chest pain worse with exhaling, 3-4 watery stools for 2 weeks Frequency: constant Pertinent Negatives: Patient denies pain, fever, abx use Disposition: [] ED /[] Urgent Care (no appt availability in office) / [x] Appointment(In office/virtual)/ []  [facility]/ [] Home Care/ [] Refused Recommended Disposition /[] [facility]/ []  Follow-up with PCP Additional Notes: Pt denies any fevers or nausea and vomiting, states that she has been able to eat and drink without issue and does not feel dehydrated. Appt scheduled for today at 1:20p  Reason for Disposition  [1] Chest pain(s) lasting a few seconds AND [2] persists > 3 days  [1] Mild diarrhea (e.g., 1-3 or more stools than normal in past 24 hours) without known cause AND [2] present >  7 days  Answer Assessment - Initial Assessment Questions 1. LOCATION: "Where does it hurt?"       Left side of chest  2. RADIATION: "Does the pain go anywhere else?" (e.g., into neck, jaw, arms, back)     None radiating  3. ONSET: "When did the chest pain begin?" (Minutes, hours or days)      Started 4 days ago  4. PATTERN: "Does the pain come and go, or has it been constant since it started?"  "Does it get worse with exertion?"      Constant, but hurts when taking deep breath and Worse when exhaling  5. DURATION: "How long does it last" (e.g., seconds, minutes, hours)     Seconds  6. SEVERITY: "How bad is the pain?"  (e.g., Scale 1-10; mild, moderate, or severe)    - MILD (1-3): doesn't interfere with normal activities     - MODERATE (4-7): interferes with normal activities or awakens from sleep    - SEVERE (8-10):  excruciating pain, unable to do any normal activities       4 when at its worse  7. CARDIAC RISK FACTORS: "Do you have any history of heart problems or risk factors for heart disease?" (e.g., angina, prior heart attack; diabetes, high blood pressure, high cholesterol, smoker, or strong family history of heart disease)     High blood pressure, 2 leaky valves, elevated heart rate  8. PULMONARY RISK FACTORS: "Do you have any history of lung disease?"  (e.g., blood clots in lung, asthma, emphysema, birth control pills)     Partial LEFT lobectomy  9. CAUSE: "What do you think is causing the chest pain?"     More related to lungs  10. OTHER SYMPTOMS: "Do you have any other symptoms?" (e.g., dizziness, nausea, vomiting, sweating, fever, difficulty breathing, cough)       An episode dizziness  11. PREGNANCY: "Is there any chance you are pregnant?" "When was your last menstrual period?"       No; tubes are clamped  Answer Assessment - Initial Assessment Questions 1. DIARRHEA SEVERITY: "How bad is the diarrhea?" "How many more stools have you had in the past 24 hours than normal?"    - NO DIARRHEA (SCALE 0)   - MILD (SCALE 1-3): Few loose or mushy BMs; increase of 1-3 stools over normal daily number of stools; mild increase in ostomy  output.   -  MODERATE (SCALE 4-7): Increase of 4-6 stools daily over normal; moderate increase in ostomy output.   -  SEVERE (SCALE 8-10; OR "WORST POSSIBLE"): Increase of 7 or more stools daily over normal; moderate increase in ostomy output; incontinence.     Mild  2. ONSET: "When did the diarrhea begin?"      2 weeks ago  3. BM CONSISTENCY: "How loose or watery is the diarrhea?"      Watery   4. VOMITING: "Are you also vomiting?" If Yes, ask: "How many times in the past 24 hours?"      No  5. ABDOMEN PAIN: "Are you having any abdomen pain?" If Yes, ask: "What does it feel like?" (e.g., crampy, dull, intermittent, constant)      No pain  6. ABDOMEN PAIN  SEVERITY: If present, ask: "How bad is the pain?"  (e.g., Scale 1-10; mild, moderate, or severe)   - MILD (1-3): doesn't interfere with normal activities, abdomen soft and not tender to touch    - MODERATE (4-7): interferes with normal activities or awakens from sleep, abdomen tender to touch    - SEVERE (8-10): excruciating pain, doubled over, unable to do any normal activities       No pain  7. ORAL INTAKE: If vomiting, "Have you been able to drink liquids?" "How much liquids have you had in the past 24 hours?"     Has been able to eat and drink fine  8. HYDRATION: "Any signs of dehydration?" (e.g., dry mouth [not just dry lips], too weak to stand, dizziness, new weight loss) "When did you last urinate?"     Doesn't feel dehydrated  9. EXPOSURE: "Have you traveled to a foreign country recently?" "Have you been exposed to anyone with diarrhea?" "Could you have eaten any food that was spoiled?"     No  10. ANTIBIOTIC USE: "Are you taking antibiotics now or have you taken antibiotics in the past 2 months?"       No antibiotics recently   11. OTHER SYMPTOMS: "Do you have any other symptoms?" (e.g., fever, blood in stool)       No  12. PREGNANCY: "Is there any chance you are pregnant?" "When was your last menstrual period?"       No  Protocols used: Chest Pain-A-AH, [facility]

## 2023-08-13 NOTE — Telephone Encounter (Signed)
Scheduled with MO today.

## 2023-08-13 NOTE — Telephone Encounter (Signed)
TC to patient, updated prep instructions sent via mychart per Beatris Ship

## 2023-08-13 NOTE — Telephone Encounter (Signed)
Copy of new instructions sent via My Chart

## 2023-08-15 ENCOUNTER — Encounter: Payer: 59 | Admitting: Internal Medicine

## 2023-08-29 DIAGNOSIS — K449 Diaphragmatic hernia without obstruction or gangrene: Secondary | ICD-10-CM

## 2023-08-29 HISTORY — DX: Diaphragmatic hernia without obstruction or gangrene: K44.9
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
## 2023-09-02 ENCOUNTER — Ambulatory Visit (HOSPITAL_BASED_OUTPATIENT_CLINIC_OR_DEPARTMENT_OTHER)
Admission: RE | Admit: 2023-09-02 | Discharge: 2023-09-02 | Disposition: A | Discharge status: Home / Self Care | Payer: 59 | Source: Ambulatory Visit | Attending: Family Medicine | Admitting: Family Medicine

## 2023-09-02 ENCOUNTER — Encounter: Payer: Self-pay | Admitting: Family Medicine

## 2023-09-02 ENCOUNTER — Ambulatory Visit (INDEPENDENT_AMBULATORY_CARE_PROVIDER_SITE_OTHER): Payer: 59 | Admitting: Family Medicine

## 2023-09-02 VITALS — BP 120/80 | HR 110 | Temp 97.6°F | Resp 18 | Ht 62.0 in | Wt 126.6 lb

## 2023-09-02 DIAGNOSIS — J852 Abscess of lung without pneumonia: Secondary | ICD-10-CM

## 2023-09-02 DIAGNOSIS — R079 Chest pain, unspecified: Secondary | ICD-10-CM

## 2023-09-02 DIAGNOSIS — I471 Supraventricular tachycardia, unspecified: Secondary | ICD-10-CM | POA: Diagnosis not present

## 2023-09-02 LAB — D-DIMER, QUANTITATIVE: D-Dimer, Quant: 0.22 ug{FEU}/mL (ref <0.50)

## 2023-09-02 LAB — TROPONIN I: Troponin I: <3 ng/L (ref <=47)

## 2023-09-03 ENCOUNTER — Encounter: Payer: Self-pay | Admitting: Family Medicine

## 2023-09-03 ENCOUNTER — Ambulatory Visit (HOSPITAL_BASED_OUTPATIENT_CLINIC_OR_DEPARTMENT_OTHER)
Admission: RE | Admit: 2023-09-03 | Discharge: 2023-09-03 | Disposition: A | Discharge status: Home / Self Care | Payer: 59 | Source: Ambulatory Visit | Attending: Family Medicine | Admitting: Family Medicine

## 2023-09-03 DIAGNOSIS — R079 Chest pain, unspecified: Secondary | ICD-10-CM | POA: Diagnosis present

## 2023-09-03 DIAGNOSIS — J852 Abscess of lung without pneumonia: Secondary | ICD-10-CM | POA: Diagnosis present

## 2023-09-03 LAB — COMPREHENSIVE METABOLIC PANEL
ALT: 14 U/L (ref 0–35)
AST: 15 U/L (ref 0–37)
Albumin: 4.7 g/dL (ref 3.5–5.2)
Alkaline Phosphatase: 56 U/L (ref 39–117)
BUN: 17 mg/dL (ref 6–23)
CO2: 27 meq/L (ref 19–32)
Calcium: 9.3 mg/dL (ref 8.4–10.5)
Chloride: 102 meq/L (ref 96–112)
Creatinine, Ser: 0.82 mg/dL (ref 0.40–1.20)
GFR: 84.47 mL/min (ref >60.00)
Glucose, Bld: 94 mg/dL (ref 70–99)
Potassium: 4.2 meq/L (ref 3.5–5.1)
Sodium: 137 meq/L (ref 135–145)
Total Bilirubin: 0.4 mg/dL (ref 0.2–1.2)
Total Protein: 7.8 g/dL (ref 6.0–8.3)

## 2023-09-03 LAB — CBC
HCT: 43.1 % (ref 36.0–46.0)
Hemoglobin: 14.7 g/dL (ref 12.0–15.0)
MCHC: 34.2 g/dL (ref 30.0–36.0)
MCV: 92 fL (ref 78.0–100.0)
Platelets: 199 10*3/uL (ref 150.0–400.0)
RBC: 4.69 Mil/uL (ref 3.87–5.11)
RDW: 13.4 % (ref 11.5–15.5)
WBC: 9.3 10*3/uL (ref 4.0–10.5)

## 2023-09-03 MED ORDER — IOHEXOL 300 MG/ML  SOLN
75.0000 mL | Freq: Once | INTRAMUSCULAR | Status: AC | PRN
  Administered 2023-09-03: 75 mL via INTRAVENOUS

## 2023-09-04 ENCOUNTER — Encounter: Payer: Self-pay | Admitting: Family Medicine

## 2023-09-12 ENCOUNTER — Encounter: Payer: Self-pay | Admitting: Internal Medicine

## 2023-09-12 ENCOUNTER — Ambulatory Visit (AMBULATORY_SURGERY_CENTER): Payer: 59 | Admitting: Internal Medicine

## 2023-09-12 VITALS — BP 141/88 | HR 89 | Temp 98.1°F | Resp 15 | Ht 65.5 in | Wt 129.0 lb

## 2023-09-12 DIAGNOSIS — K648 Other hemorrhoids: Secondary | ICD-10-CM | POA: Diagnosis not present

## 2023-09-12 DIAGNOSIS — Z1211 Encounter for screening for malignant neoplasm of colon: Secondary | ICD-10-CM | POA: Diagnosis present

## 2023-09-12 MED ORDER — SODIUM CHLORIDE 0.9 % IV SOLN: 500.0000 mL | Freq: Once | INTRAVENOUS | Status: DC

## 2023-09-12 NOTE — Progress Notes (Signed)
 Report to PACU, RN, vss, BBS= Clear.

## 2023-09-12 NOTE — Patient Instructions (Signed)
-  repeat colonoscopy in 10 years  for surveillance recommended.  -Continue present medications    YOU HAD AN ENDOSCOPIC PROCEDURE TODAY AT THE [facility]:   Refer to the procedure report that was given to you for any specific questions about what was found during the examination.  If the procedure report does not answer your questions, please call your gastroenterologist to clarify.  If you requested that your care partner not be given the details of your procedure findings, then the procedure report has been included in a sealed envelope for you to review at your convenience later.  YOU SHOULD EXPECT: Some feelings of bloating in the abdomen. Passage of more gas than usual.  Walking can help get rid of the air that was put into your GI tract during the procedure and reduce the bloating. If you had a lower endoscopy (such as a colonoscopy or flexible sigmoidoscopy) you may notice spotting of blood in your stool or on the toilet paper. If you underwent a bowel prep for your procedure, you may not have a normal bowel movement for a few days.  Please Note:  You might notice some irritation and congestion in your nose or some drainage.  This is from the oxygen used during your procedure.  There is no need for concern and it should clear up in a day or so.  SYMPTOMS TO REPORT IMMEDIATELY:  Following lower endoscopy (colonoscopy or flexible sigmoidoscopy):  Excessive amounts of blood in the stool  Significant tenderness or worsening of abdominal pains  Swelling of the abdomen that is new, acute  Fever of 100F or higher  For urgent or emergent issues, a gastroenterologist can be reached at any hour by calling [phone]. Do not use MyChart messaging for urgent concerns.    DIET:  We do recommend a small meal at first, but then you may proceed to your regular diet.  Drink plenty of fluids but you should avoid alcoholic beverages for 24 hours.  ACTIVITY:  You should plan to take it  easy for the rest of today and you should NOT DRIVE or use heavy machinery until tomorrow (because of the sedation medicines used during the test).    FOLLOW UP: Our staff will call the number listed on your records the next business day following your procedure.  We will call around 7:15- 8:00 am to check on you and address any questions or concerns that you may have regarding the information given to you following your procedure. If we do not reach you, we will leave a message.     If any biopsies were taken you will be contacted by phone or by letter within the next 1-3 weeks.  Please call us at [phone] if you have not heard about the biopsies in 3 weeks.    SIGNATURES/CONFIDENTIALITY: You and/or your care partner have signed paperwork which will be entered into your electronic medical record.  These signatures attest to the fact that that the information above on your After Visit Summary has been reviewed and is understood.  Full responsibility of the confidentiality of this discharge information lies with you and/or your care-partner.

## 2023-09-12 NOTE — Progress Notes (Signed)
 Pt reports being diagnosed with 2 hiatal hernia's and mild emphysema.

## 2023-09-12 NOTE — Op Note (Signed)
 [facility] Patient Name: [name] Procedure Date: 09/12/2023 7:34 AM MRN: [id number] Endoscopist: Wilhemina Bonito. Marina Goodell , MD, [id number] Age: 48 Referring MD:  Date of Birth: 1976/03/18 Gender: Female Account #: [id number] Procedure:                Colonoscopy Indications:              Screening for colorectal malignant neoplasm Medicines:                Monitored Anesthesia Care Procedure:                Pre-Anesthesia Assessment:                           - Prior to the procedure, a History and Physical                            was performed, and patient medications and                            allergies were reviewed. The patient's tolerance of                            previous anesthesia was also reviewed. The risks                            and benefits of the procedure and the sedation                            options and risks were discussed with the patient.                            All questions were answered, and informed consent                            was obtained. Prior Anticoagulants: The patient has                            taken no anticoagulant or antiplatelet agents. ASA                            Grade Assessment: II - A patient with mild systemic                            disease. After reviewing the risks and benefits,                            the patient was deemed in satisfactory condition to                            undergo the procedure.                           After obtaining informed consent, the colonoscope  was passed under direct vision. Throughout the                            procedure, the patient's blood pressure, pulse, and                            oxygen saturations were monitored continuously. The                            Olympus scope [id number] was introduced through the                            anus and advanced to the the cecum, identified by                            appendiceal  orifice and ileocecal valve. The                            ileocecal valve, appendiceal orifice, and rectum                            were photographed. The quality of the bowel                            preparation was excellent. The colonoscopy was                            performed without difficulty. The patient tolerated                            the procedure well. The bowel preparation used was                            SUPREP via split dose instruction. Scope In: 8:39:58 AM Scope Out: 8:47:39 AM Scope Withdrawal Time: 0 hours 5 minutes 33 seconds  Total Procedure Duration: 0 hours 7 minutes 41 seconds  Findings:                 The entire examined colon appeared normal on direct                            and retroflexion views. small internal hemorrhoids Complications:            No immediate complications. Estimated blood loss:                            None. Estimated Blood Loss:     Estimated blood loss: none. Impression:               - The entire examined colon is normal on direct and                            retroflexion views.                           -  No specimens collected. Recommendation:           - Repeat colonoscopy in 10 years for screening                            purposes.                           - Patient has a contact number available for                            emergencies. The signs and symptoms of potential                            delayed complications were discussed with the                            patient. Return to normal activities tomorrow.                            Written discharge instructions were provided to the                            patient.                           - Resume previous diet.                           - Continue present medications. Wilhemina Bonito. Marina Goodell, MD 09/12/2023 8:56:52 AM This report has been signed electronically.

## 2023-09-12 NOTE — Progress Notes (Signed)
 HISTORY OF PRESENT ILLNESS:  [name] is a 48 y.o. female sent for screening colonoscopy.  No complaints  REVIEW OF SYSTEMS:  All non-GI ROS negative except for  Past Medical History:  Diagnosis Date   Anemia    Atypical chest pain 09/22/2008   24 Holter Monitor showing NSR while patient endorsing symptoms.     Back pain 02/13/2012   Claudication in peripheral vascular disease (HCC) 05/24/2017   DYSPLASTIC NEVUS, BACK 06/24/2008   Qualifier: Diagnosis of  By: Daiva Eves MD, Cornelius     Elevated blood pressure reading without diagnosis of hypertension 01/25/2012   Emphysema of lung (HCC)    Episodic tension type headache 07/21/2013   Family history of coronary artery disease 12/05/2010   FATIGUE 05/18/2010   Qualifier: Diagnosis of  By: Sheffield Slider MD, Wayne     Headache(784.0) 01/25/2012   Heart murmur    no problems   Hemorrhoids    Hiatal hernia 08/29/2023   2 hiatal hernia's per pt   History of melanoma 04/23/2017   Hot thyroid nodule 2009   needle bx negative   Irregular menses 08/24/2010   Lung abscess (HCC) 01/15/2011   MALIGNANT MELANOMA SKIN TRUNK EXCEPT SCROTUM 09/22/2008   Qualifier: Diagnosis of  By: Daiva Eves MD, Cornelius     Melanoma [facility])    stage 3 - mid back   Menorrhagia    Migraine headache    Palpitations 05/18/2010   Qualifier: Diagnosis of  By: Sheffield Slider MD, Wayne     Paroxysmal hypertension 07/21/2013   Pituitary cyst (HCC)    Pneumonia, organism unspecified(486) 11/09/2008   Formatting of this note might be different from the original. 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   No evidence of active TB     Tobacco abuse counseling 06/23/2008   Vaso vagal episode    WEIGHT LOSS, ABNORMAL 06/24/2008   Qualifier: Diagnosis of  By: Daiva Eves MD,  Aleda Grana: Diagnosis of  By: Sheffield Slider MD, Deniece Portela      Past Surgical History:  Procedure Laterality Date   BREAST SURGERY     augmentation   BRONCHOSCOPY     COLONOSCOPY  2010   in [place], [place]-normal exam   DILATION AND CURETTAGE OF UTERUS N/A 10/22/2017   Procedure: DILATATION AND CURETTAGE;  Surgeon: Allie Bossier, MD;  Location: [facility];  Service: Gynecology;  Laterality: N/A;   LUNG REMOVAL, PARTIAL Left 2010   upper left lobe   MEDIASTINOSCOPY     TUBAL LIGATION     UPPER GI ENDOSCOPY  2010   in [place], [place]-normal   wide excision     melanoma removed - mid back   WISDOM TOOTH EXTRACTION      Social History Courtney A Dondero  reports that she has been smoking cigarettes. She has a 23 pack-year smoking history. She has never used smokeless tobacco. She reports current alcohol use. She reports that she does not use drugs.  family history includes Adrenal disorder in her sister; CAD in her sister; COPD in her mother; Coronary artery disease in an other family member; Diabetes in her father; Heart attack in her father and sister; Heart disease in her father, mother, sister,  and sister; Heart murmur in her daughter; Lymphoma in an other family member; Pectus carinatum in her son; Stomach cancer in her maternal aunt; Supraventricular tachycardia in her sister; Thyroid cancer in her daughter.  Allergies  Allergen Reactions   Augmentin [Amoxicillin-Pot Clavulanate] Diarrhea    Diarrhea and anorexia       PHYSICAL EXAMINATION: Vital signs: BP (!) 141/82   Pulse 79   Temp 98.1 F (36.7 C) (Temporal)   Resp 15   Ht 5' 5.5" (1.664 m)   Wt 129 lb (58.5 kg)   LMP 09/05/2023 (Approximate)   SpO2 99%   BMI 21.14 kg/m  General: Well-developed, well-nourished, no acute distress HEENT: Sclerae are anicteric, conjunctiva pink. Oral mucosa intact Lungs: Clear Heart: Regular Abdomen: soft, nontender, nondistended, no obvious ascites, no peritoneal signs, normal bowel  sounds. No organomegaly. Extremities: No edema Psychiatric: alert and oriented x3. Cooperative     ASSESSMENT: Colon cancer screening    PLAN:  Screening colonoscopy

## 2023-09-13 ENCOUNTER — Telehealth: Payer: Self-pay | Admitting: *Deleted

## 2023-09-13 NOTE — Telephone Encounter (Signed)
 No answer for post procedure call back. Left VM.

## 2024-01-09 ENCOUNTER — Telehealth: Admitting: Physician Assistant

## 2024-01-09 ENCOUNTER — Encounter: Payer: Self-pay | Admitting: Physician Assistant

## 2024-01-09 DIAGNOSIS — J069 Acute upper respiratory infection, unspecified: Secondary | ICD-10-CM | POA: Diagnosis not present

## 2024-01-09 DIAGNOSIS — B9689 Other specified bacterial agents as the cause of diseases classified elsewhere: Secondary | ICD-10-CM | POA: Diagnosis not present

## 2024-01-09 MED ORDER — AZITHROMYCIN 250 MG PO TABS: ORAL_TABLET | ORAL | 0 refills | Status: AC

## 2024-01-09 NOTE — Patient Instructions (Signed)
 [facility], thank you for joining Delon CHRISTELLA Dickinson, PA-C for today's virtual visit.  While this provider is not your primary care provider (PCP), if your PCP is located in our provider database this encounter information will be shared with them immediately following your visit.   A [facility] MyChart account gives you access to today's visit and all your visits, tests, and labs performed at [facility]  click here if you don't have a [facility] MyChart account or go to [email or web address]  Consent: (Patient) [name] provided verbal consent for this virtual visit at the beginning of the encounter.  Current Medications:  Current Outpatient Medications:    azithromycin  (ZITHROMAX ) 250 MG tablet, Take 2 tablets on day 1, then 1 tablet daily on days 2 through 5, Disp: 6 tablet, Rfl: 0   aspirin  EC 81 MG tablet, Take 81 mg by mouth daily. Swallow whole., Disp: , Rfl:    Aspirin -Salicylamide-Caffeine (BC HEADACHE PO), Take by mouth., Disp: , Rfl:    atorvastatin  (LIPITOR) 10 MG tablet, Take 1 tablet (10 mg total) by mouth daily., Disp: 90 tablet, Rfl: 3   cholecalciferol (VITAMIN D3) 25 MCG (1000 UNIT) tablet, Take 2,000 Units by mouth daily., Disp: , Rfl:    clobetasol  cream (TEMOVATE ) 0.05 %, Apply 1 Application topically 2 (two) times daily., Disp: 30 g, Rfl: 0   ibuprofen  (ADVIL ,MOTRIN ) 600 MG tablet, Take 1 tablet (600 mg total) by mouth every 6 (six) hours as needed., Disp: 30 tablet, Rfl: 0   metoprolol  succinate (TOPROL  XL) 25 MG 24 hr tablet, Take 0.5 tablets (12.5 mg total) by mouth at bedtime. Take extra tablet if having palpitations., Disp: 45 tablet, Rfl: 3   Medications ordered in this encounter:  Meds ordered this encounter  Medications   azithromycin  (ZITHROMAX ) 250 MG tablet    Sig: Take 2 tablets on day 1, then 1 tablet daily on days 2 through 5    Dispense:  6 tablet    Refill:  0    Supervising Provider:   LAMPTEY, PHILIP O [phone]     *If  you need refills on other medications prior to your next appointment, please contact your pharmacy*  Follow-Up: Call back or seek an in-person evaluation if the symptoms worsen or if the condition fails to improve as anticipated.  [facility] [phone]  Other Instructions Upper Respiratory Infection, Adult An upper respiratory infection (URI) is a common viral infection of the nose, throat, and upper air passages that lead to the lungs. The most common type of URI is the common cold. URIs usually get better on their own, without medical treatment. What are the causes? A URI is caused by a virus. You may catch a virus by: Breathing in droplets from an infected person's cough or sneeze. Touching something that has been exposed to the virus (is contaminated) and then touching your mouth, nose, or eyes. What increases the risk? You are more likely to get a URI if: You are very young or very old. You have close contact with others, such as at work, school, or a health care facility. You smoke. You have long-term (chronic) heart or lung disease. You have a weakened disease-fighting system (immune system). You have nasal allergies or asthma. You are experiencing a lot of stress. You have poor nutrition. What are the signs or symptoms? A URI usually involves some of the following symptoms: Runny or stuffy (congested) nose. Cough. Sneezing. Sore throat. Headache. Fatigue. Fever.  Loss of appetite. Pain in your forehead, behind your eyes, and over your cheekbones (sinus pain). Muscle aches. Redness or irritation of the eyes. Pressure in the ears or face. How is this diagnosed? This condition may be diagnosed based on your medical history and symptoms, and a physical exam. Your health care provider may use a swab to take a mucus sample from your nose (nasal swab). This sample can be tested to determine what virus is causing the illness. How is this treated? URIs usually  get better on their own within 7-10 days. Medicines cannot cure URIs, but your health care provider may recommend certain medicines to help relieve symptoms, such as: Over-the-counter cold medicines. Cough suppressants. Coughing is a type of defense against infection that helps to clear the respiratory system, so take these medicines only as recommended by your health care provider. Fever-reducing medicines. Follow these instructions at home: Activity Rest as needed. If you have a fever, stay home from work or school until your fever is gone or until your health care provider says your URI cannot spread to other people (is no longer contagious). Your health care provider may have you wear a face mask to prevent your infection from spreading. Relieving symptoms Gargle with a mixture of salt and water 3-4 times a day or as needed. To make salt water, completely dissolve -1 tsp (3-6 g) of salt in 1 cup (237 mL) of warm water. Use a cool-mist humidifier to add moisture to the air. This can help you breathe more easily. Eating and drinking  Drink enough fluid to keep your urine pale yellow. Eat soups and other clear broths. General instructions  Take over-the-counter and prescription medicines only as told by your health care provider. These include cold medicines, fever reducers, and cough suppressants. Do not use any products that contain nicotine or tobacco. These products include cigarettes, chewing tobacco, and vaping devices, such as e-cigarettes. If you need help quitting, ask your health care provider. Stay away from secondhand smoke. Stay up to date on all immunizations, including the yearly (annual) flu vaccine. Keep all follow-up visits. This is important. How to prevent the spread of infection to others URIs can be contagious. To prevent the infection from spreading: Wash your hands with soap and water for at least 20 seconds. If soap and water are not available, use hand  sanitizer. Avoid touching your mouth, face, eyes, or nose. Cough or sneeze into a tissue or your sleeve or elbow instead of into your hand or into the air.  Contact a health care provider if: You are getting worse instead of better. You have a fever or chills. Your mucus is brown or red. You have yellow or brown discharge coming from your nose. You have pain in your face, especially when you bend forward. You have swollen neck glands. You have pain while swallowing. You have white areas in the back of your throat. Get help right away if: You have shortness of breath that gets worse. You have severe or persistent: Headache. Ear pain. Sinus pain. Chest pain. You have chronic lung disease along with any of the following: Making high-pitched whistling sounds when you breathe, most often when you breathe out (wheezing). Prolonged cough (more than 14 days). Coughing up blood. A change in your usual mucus. You have a stiff neck. You have changes in your: Vision. Hearing. Thinking. Mood. These symptoms may be an emergency. Get help right away. Call 911. Do not wait to see if the symptoms  will go away. Do not drive yourself to the hospital. Summary An upper respiratory infection (URI) is a common infection of the nose, throat, and upper air passages that lead to the lungs. A URI is caused by a virus. URIs usually get better on their own within 7-10 days. Medicines cannot cure URIs, but your health care provider may recommend certain medicines to help relieve symptoms. This information is not intended to replace advice given to you by your health care provider. Make sure you discuss any questions you have with your health care provider. Document Revised: 02/01/2021 Document Reviewed: 02/01/2021 Elsevier Patient Education  2024 [employer]   If you have been instructed to have an in-person evaluation today at a local Urgent Care facility, please use the link below. It will take  you to a list of all of our available [facility], including address, phone number and hours of operation. Please do not delay care.  [facility]  If you or a family member do not have a primary care provider, use the link below to schedule a visit and establish care. When you choose a [facility] primary care physician or advanced practice provider, you gain a long-term partner in health. Find a Primary Care Provider  Learn more about [facility] in-office and virtual care options: [facility] - Get Care Now

## 2024-01-09 NOTE — Progress Notes (Signed)
 Virtual Visit Consent   [name], you are scheduled for a virtual visit with a [facility] provider today. Just as with appointments in the office, your consent must be obtained to participate. Your consent will be active for this visit and any virtual visit you may have with one of our providers in the next 365 days. If you have a MyChart account, a copy of this consent can be sent to you electronically.  As this is a virtual visit, video technology does not allow for your provider to perform a traditional examination. This may limit your provider's ability to fully assess your condition. If your provider identifies any concerns that need to be evaluated in person or the need to arrange testing (such as labs, EKG, etc.), we will make arrangements to do so. Although advances in technology are sophisticated, we cannot ensure that it will always work on either your end or our end. If the connection with a video visit is poor, the visit may have to be switched to a telephone visit. With either a video or telephone visit, we are not always able to ensure that we have a secure connection.  By engaging in this virtual visit, you consent to the provision of healthcare and authorize for your insurance to be billed (if applicable) for the services provided during this visit. Depending on your insurance coverage, you may receive a charge related to this service.  I need to obtain your verbal consent now. Are you willing to proceed with your visit today? [name] has provided verbal consent on 01/09/2024 for a virtual visit (video or telephone). Delon CHRISTELLA Dickinson, PA-C  Date: 01/09/2024 12:30 PM   Virtual Visit via Video Note   I, Delon CHRISTELLA Dickinson, connected with  [name]  ([id number], [birth year]) on 01/09/24 at 12:30 PM EDT by a video-enabled telemedicine application and verified that I am speaking with the correct person using two identifiers.  Location: Patient: Virtual Visit Location  Patient: Home Provider: Virtual Visit Location Provider: Home Office   I discussed the limitations of evaluation and management by telemedicine and the availability of in person appointments. The patient expressed understanding and agreed to proceed.    History of Present Illness: [name] is a 48 y.o. who identifies as a female who was assigned female at birth, and is being seen today for URI symptoms.  HPI: URI  This is a new problem. The current episode started in the past 7 days. The problem has been gradually worsening. Maximum temperature: 99.5 this morning. The fever has been present for Less than 1 day. Associated symptoms include chest pain (from coughing), congestion, coughing, diarrhea, headaches, rhinorrhea and a sore throat (scratchy throat was initial symptom;worsened to sore throat that is worsened by coughing). Pertinent negatives include no ear pain, nausea, plugged ear sensation, sinus pain, vomiting or wheezing. Associated symptoms comments: fatigue. Treatments tried: mucinex cold, severe cold and flu nyquil. The treatment provided no relief.     Problems:  Patient Active Problem List   Diagnosis Date Noted   Sinusitis 01/23/2023   Bronchitis 01/23/2023   Vaso vagal episode    SVD (spontaneous vaginal delivery)    Positive PPD    Pituitary cyst (HCC)    Migraine headache    Menorrhagia    Melanoma (HCC)    Heart murmur    Anemia    Claudication in peripheral vascular disease (HCC) 05/24/2017   Smoking 05/24/2017   History of melanoma 04/23/2017  Episodic tension type headache 07/21/2013   Paroxysmal hypertension 07/21/2013   Back pain 02/13/2012   Headache 01/25/2012   Endocrine disorder 01/25/2012   Elevated blood pressure reading without diagnosis of hypertension 01/25/2012   Lung abscess (HCC) 01/15/2011   Family history of coronary artery disease 12/05/2010   Dizziness 09/08/2010   FATIGUE 05/18/2010   PALPITATIONS 05/18/2010   Pneumonia, organism  unspecified(486) 11/09/2008   MALIGNANT MELANOMA SKIN TRUNK EXCEPT SCROTUM 09/22/2008   Atypical chest pain 09/22/2008   DYSPLASTIC NEVUS, BACK 06/24/2008   Solitary pulmonary nodule 06/24/2008   TB lung, latent 06/24/2008   WEIGHT LOSS, ABNORMAL 06/24/2008   Tobacco abuse counseling 06/23/2008   Hot thyroid  nodule 2009    Allergies:  Allergies  Allergen Reactions   Augmentin  [Amoxicillin -Pot Clavulanate] Diarrhea    Diarrhea and anorexia   Medications:  Current Outpatient Medications:    azithromycin  (ZITHROMAX ) 250 MG tablet, Take 2 tablets on day 1, then 1 tablet daily on days 2 through 5, Disp: 6 tablet, Rfl: 0   aspirin  EC 81 MG tablet, Take 81 mg by mouth daily. Swallow whole., Disp: , Rfl:    Aspirin -Salicylamide-Caffeine (BC HEADACHE PO), Take by mouth., Disp: , Rfl:    atorvastatin  (LIPITOR) 10 MG tablet, Take 1 tablet (10 mg total) by mouth daily., Disp: 90 tablet, Rfl: 3   cholecalciferol (VITAMIN D3) 25 MCG (1000 UNIT) tablet, Take 2,000 Units by mouth daily., Disp: , Rfl:    clobetasol  cream (TEMOVATE ) 0.05 %, Apply 1 Application topically 2 (two) times daily., Disp: 30 g, Rfl: 0   ibuprofen  (ADVIL ,MOTRIN ) 600 MG tablet, Take 1 tablet (600 mg total) by mouth every 6 (six) hours as needed., Disp: 30 tablet, Rfl: 0   metoprolol  succinate (TOPROL  XL) 25 MG 24 hr tablet, Take 0.5 tablets (12.5 mg total) by mouth at bedtime. Take extra tablet if having palpitations., Disp: 45 tablet, Rfl: 3  Observations/Objective: Patient is well-developed, well-nourished in no acute distress.  Resting comfortably at home.  Head is normocephalic, atraumatic.  No labored breathing.  Speech is clear and coherent with logical content.  Patient is alert and oriented at baseline.    Assessment and Plan: 1. Bacterial upper respiratory infection (Primary) - azithromycin  (ZITHROMAX ) 250 MG tablet; Take 2 tablets on day 1, then 1 tablet daily on days 2 through 5  Dispense: 6 tablet; Refill:  0  - Worsening over a week despite OTC medications - Will treat with Z-pack  - Can continue Mucinex as needed - Tylenol  and/or Ibuprofen  as needed - Push fluids.  - Rest.  - Steam and humidifier can help - Seek in person evaluation if worsening or symptoms fail to improve    Follow Up Instructions: I discussed the assessment and treatment plan with the patient. The patient was provided an opportunity to ask questions and all were answered. The patient agreed with the plan and demonstrated an understanding of the instructions.  A copy of instructions were sent to the patient via MyChart unless otherwise noted below.    The patient was advised to call back or seek an in-person evaluation if the symptoms worsen or if the condition fails to improve as anticipated.    Delon CHRISTELLA Dickinson, PA-C

## 2024-01-22 ENCOUNTER — Encounter: Payer: Self-pay | Admitting: Family Medicine

## 2024-05-06 ENCOUNTER — Other Ambulatory Visit: Payer: Self-pay | Admitting: Medical Genetics

## 2024-05-06 DIAGNOSIS — Z006 Encounter for examination for normal comparison and control in clinical research program: Secondary | ICD-10-CM

## 2024-06-03 ENCOUNTER — Other Ambulatory Visit: Payer: Self-pay | Admitting: Family Medicine

## 2024-06-03 DIAGNOSIS — R002 Palpitations: Secondary | ICD-10-CM

## 2024-06-27 ENCOUNTER — Other Ambulatory Visit: Payer: Self-pay | Admitting: Family Medicine

## 2024-06-27 DIAGNOSIS — E785 Hyperlipidemia, unspecified: Secondary | ICD-10-CM
